# Patient Record
Sex: Female | Born: 1935 | Race: Black or African American | Hispanic: No | Smoking: Former smoker
Health system: Southern US, Community
[De-identification: ages and names within clinical notes are randomized; demographics above are authoritative.]

## PROBLEM LIST (undated history)

## (undated) DIAGNOSIS — I639 Cerebral infarction, unspecified: Secondary | ICD-10-CM

## (undated) DIAGNOSIS — N189 Chronic kidney disease, unspecified: Secondary | ICD-10-CM

## (undated) DIAGNOSIS — I10 Essential (primary) hypertension: Secondary | ICD-10-CM

## (undated) DIAGNOSIS — T783XXA Angioneurotic edema, initial encounter: Secondary | ICD-10-CM

## (undated) DIAGNOSIS — E079 Disorder of thyroid, unspecified: Secondary | ICD-10-CM

## (undated) HISTORY — DX: Chronic kidney disease, unspecified: N18.9

## (undated) HISTORY — PX: ABDOMINAL HYSTERECTOMY: SHX81

## (undated) HISTORY — PX: OTHER SURGICAL HISTORY: SHX169

---

## 1999-05-27 ENCOUNTER — Encounter: Payer: Self-pay | Admitting: Endocrinology

## 1999-05-27 ENCOUNTER — Ambulatory Visit (HOSPITAL_COMMUNITY): Admission: RE | Admit: 1999-05-27 | Discharge: 1999-05-27 | Payer: Self-pay | Admitting: Endocrinology

## 2000-06-06 ENCOUNTER — Other Ambulatory Visit: Admission: RE | Admit: 2000-06-06 | Discharge: 2000-06-06 | Payer: Self-pay | Admitting: Gynecology

## 2000-11-15 ENCOUNTER — Ambulatory Visit (HOSPITAL_COMMUNITY): Admission: RE | Admit: 2000-11-15 | Discharge: 2000-11-15 | Payer: Self-pay | Admitting: Gynecology

## 2000-11-15 ENCOUNTER — Encounter: Payer: Self-pay | Admitting: Gynecology

## 2001-06-18 ENCOUNTER — Other Ambulatory Visit: Admission: RE | Admit: 2001-06-18 | Discharge: 2001-06-18 | Payer: Self-pay | Admitting: Gynecology

## 2001-10-15 ENCOUNTER — Ambulatory Visit (HOSPITAL_COMMUNITY): Admission: RE | Admit: 2001-10-15 | Discharge: 2001-10-15 | Payer: Self-pay | Admitting: Endocrinology

## 2001-10-15 ENCOUNTER — Encounter: Payer: Self-pay | Admitting: Endocrinology

## 2001-11-18 ENCOUNTER — Ambulatory Visit (HOSPITAL_COMMUNITY): Admission: RE | Admit: 2001-11-18 | Discharge: 2001-11-18 | Payer: Self-pay | Admitting: Gynecology

## 2001-11-18 ENCOUNTER — Encounter: Payer: Self-pay | Admitting: Gynecology

## 2002-10-28 ENCOUNTER — Other Ambulatory Visit: Admission: RE | Admit: 2002-10-28 | Discharge: 2002-10-28 | Payer: Self-pay | Admitting: Gynecology

## 2003-04-23 ENCOUNTER — Ambulatory Visit (HOSPITAL_COMMUNITY): Admission: RE | Admit: 2003-04-23 | Discharge: 2003-04-23 | Payer: Self-pay | Admitting: Gynecology

## 2003-04-23 ENCOUNTER — Encounter: Payer: Self-pay | Admitting: Gynecology

## 2004-04-26 ENCOUNTER — Ambulatory Visit (HOSPITAL_COMMUNITY): Admission: RE | Admit: 2004-04-26 | Discharge: 2004-04-26 | Payer: Self-pay | Admitting: Endocrinology

## 2005-05-04 ENCOUNTER — Ambulatory Visit (HOSPITAL_COMMUNITY): Admission: RE | Admit: 2005-05-04 | Discharge: 2005-05-04 | Payer: Self-pay | Admitting: Endocrinology

## 2008-03-25 ENCOUNTER — Inpatient Hospital Stay (HOSPITAL_COMMUNITY): Admission: EM | Admit: 2008-03-25 | Discharge: 2008-03-30 | Payer: Self-pay | Admitting: Emergency Medicine

## 2008-03-26 ENCOUNTER — Encounter (INDEPENDENT_AMBULATORY_CARE_PROVIDER_SITE_OTHER): Payer: Self-pay | Admitting: Internal Medicine

## 2008-04-21 ENCOUNTER — Ambulatory Visit (HOSPITAL_COMMUNITY): Admission: RE | Admit: 2008-04-21 | Discharge: 2008-04-21 | Payer: Self-pay | Admitting: Internal Medicine

## 2008-04-24 ENCOUNTER — Ambulatory Visit (HOSPITAL_COMMUNITY): Admission: RE | Admit: 2008-04-24 | Discharge: 2008-04-24 | Payer: Self-pay | Admitting: Urology

## 2008-05-13 ENCOUNTER — Other Ambulatory Visit: Admission: RE | Admit: 2008-05-13 | Discharge: 2008-05-13 | Payer: Self-pay | Admitting: Interventional Radiology

## 2008-05-13 ENCOUNTER — Encounter: Admission: RE | Admit: 2008-05-13 | Discharge: 2008-05-13 | Payer: Self-pay | Admitting: Internal Medicine

## 2008-05-13 ENCOUNTER — Encounter (INDEPENDENT_AMBULATORY_CARE_PROVIDER_SITE_OTHER): Payer: Self-pay | Admitting: Interventional Radiology

## 2008-05-18 ENCOUNTER — Ambulatory Visit (HOSPITAL_COMMUNITY): Admission: RE | Admit: 2008-05-18 | Discharge: 2008-05-18 | Payer: Self-pay | Admitting: Urology

## 2008-05-19 ENCOUNTER — Encounter: Admission: RE | Admit: 2008-05-19 | Discharge: 2008-05-19 | Payer: Self-pay | Admitting: Urology

## 2008-06-03 ENCOUNTER — Ambulatory Visit (HOSPITAL_COMMUNITY): Admission: RE | Admit: 2008-06-03 | Discharge: 2008-06-03 | Payer: Self-pay | Admitting: Family Medicine

## 2008-08-06 ENCOUNTER — Encounter (INDEPENDENT_AMBULATORY_CARE_PROVIDER_SITE_OTHER): Payer: Self-pay | Admitting: Urology

## 2008-08-06 ENCOUNTER — Inpatient Hospital Stay (HOSPITAL_COMMUNITY): Admission: RE | Admit: 2008-08-06 | Discharge: 2008-08-09 | Payer: Self-pay | Admitting: Urology

## 2008-12-15 ENCOUNTER — Ambulatory Visit (HOSPITAL_COMMUNITY): Admission: RE | Admit: 2008-12-15 | Discharge: 2008-12-15 | Payer: Self-pay | Admitting: Urology

## 2009-02-17 ENCOUNTER — Ambulatory Visit (HOSPITAL_COMMUNITY): Admission: RE | Admit: 2009-02-17 | Discharge: 2009-02-17 | Payer: Self-pay | Admitting: Urology

## 2009-02-24 ENCOUNTER — Inpatient Hospital Stay (HOSPITAL_COMMUNITY): Admission: AD | Admit: 2009-02-24 | Discharge: 2009-03-02 | Payer: Self-pay | Admitting: Internal Medicine

## 2009-02-24 ENCOUNTER — Encounter: Payer: Self-pay | Admitting: Emergency Medicine

## 2009-02-25 ENCOUNTER — Encounter (INDEPENDENT_AMBULATORY_CARE_PROVIDER_SITE_OTHER): Payer: Self-pay | Admitting: Internal Medicine

## 2009-02-25 ENCOUNTER — Ambulatory Visit: Payer: Self-pay | Admitting: Physical Medicine & Rehabilitation

## 2009-02-26 ENCOUNTER — Encounter (INDEPENDENT_AMBULATORY_CARE_PROVIDER_SITE_OTHER): Payer: Self-pay | Admitting: Internal Medicine

## 2009-03-02 ENCOUNTER — Inpatient Hospital Stay (HOSPITAL_COMMUNITY)
Admission: RE | Admit: 2009-03-02 | Discharge: 2009-03-27 | Payer: Self-pay | Admitting: Physical Medicine & Rehabilitation

## 2009-03-06 ENCOUNTER — Ambulatory Visit: Payer: Self-pay | Admitting: Physical Medicine & Rehabilitation

## 2009-03-15 ENCOUNTER — Ambulatory Visit: Payer: Self-pay | Admitting: Psychology

## 2009-03-30 ENCOUNTER — Encounter
Admission: RE | Admit: 2009-03-30 | Discharge: 2009-05-26 | Payer: Self-pay | Admitting: Physical Medicine & Rehabilitation

## 2009-04-12 IMAGING — US US BIOPSY
1 series · 13 of 15 positions shown · non-contrast
Comparison: Prior thyroid ultrasound dated 04/21/2008

CLINICAL DATA: Thyroid goiter with dominant solid nodules in both
right and left lobes of thyroid.  The patient presents for biopsy.

ULTRASOUND-GUIDED NEEDLE ASPIRATE BIOPSY OF BILATERAL THYROID
NODULES
The above procedure was discussed with the patient and written
informed consent was obtained.

[Series 1: us biopsy · 0.09mm/px · 15 acquisitions, 13 frames shown]
[im 1/15]
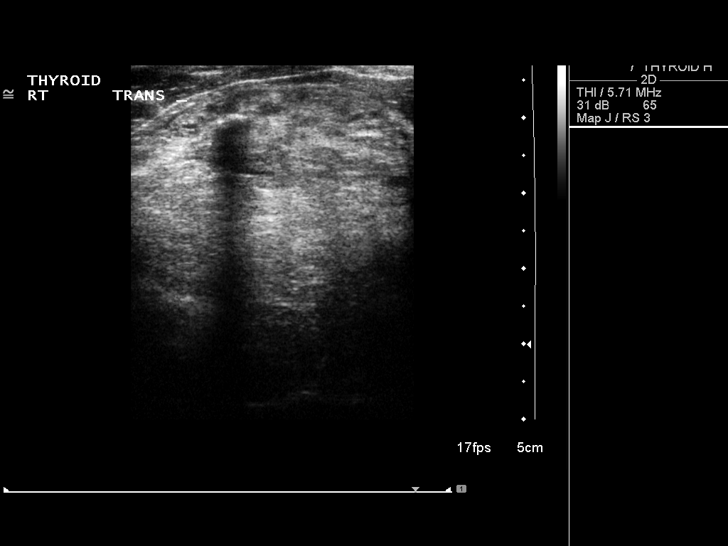
[im 2/15]
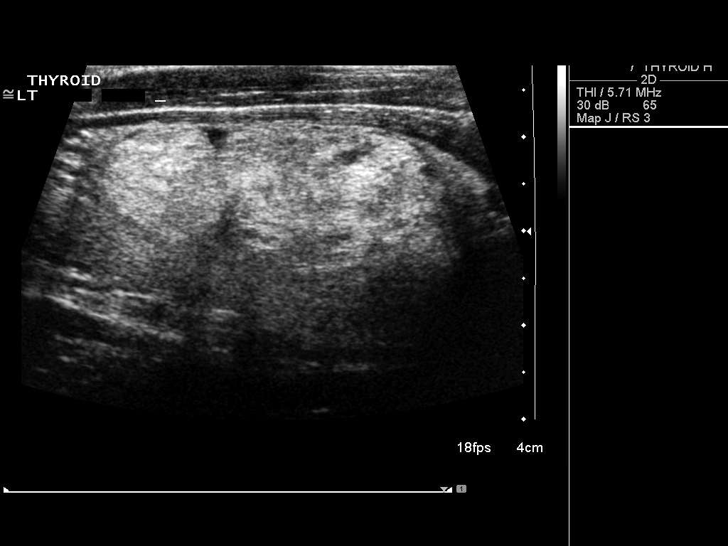
[im 3/15]
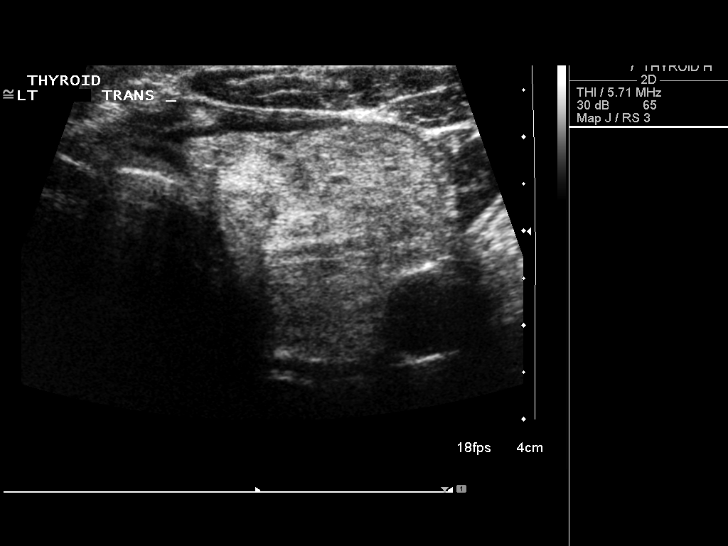
[im 5/15]
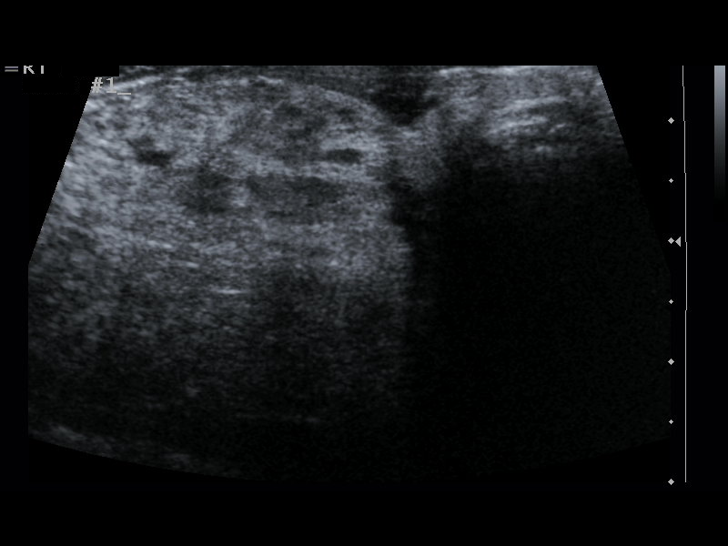
[im 6/15]
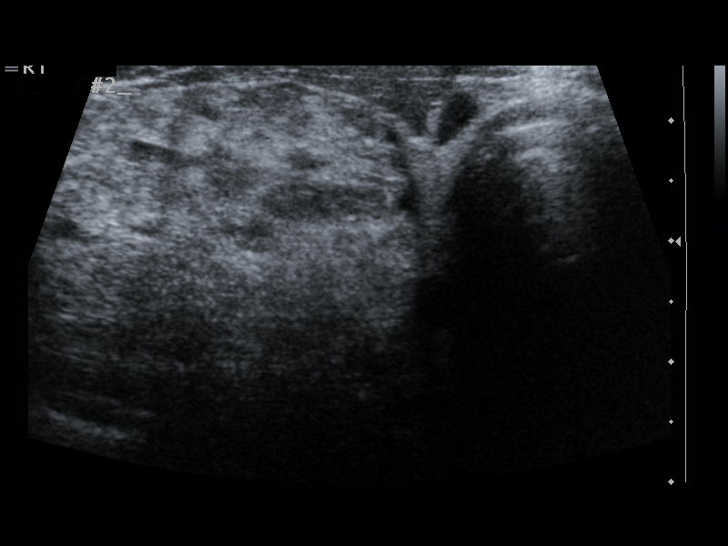
[im 7/15]
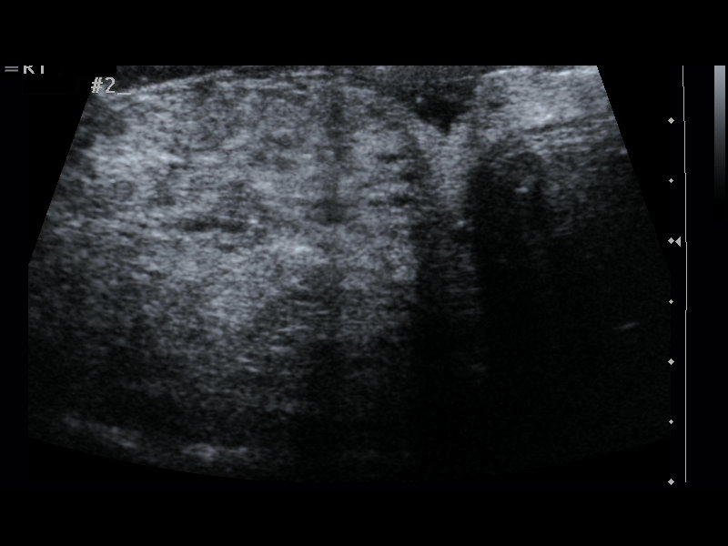
[im 8/15]
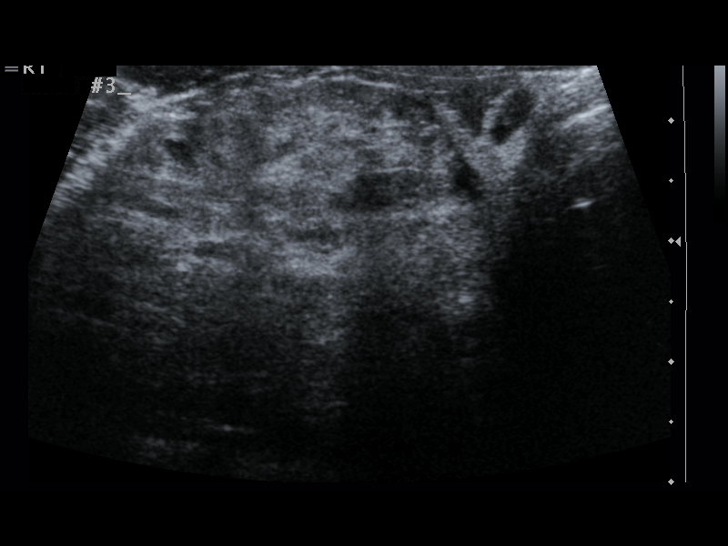
[im 9/15]
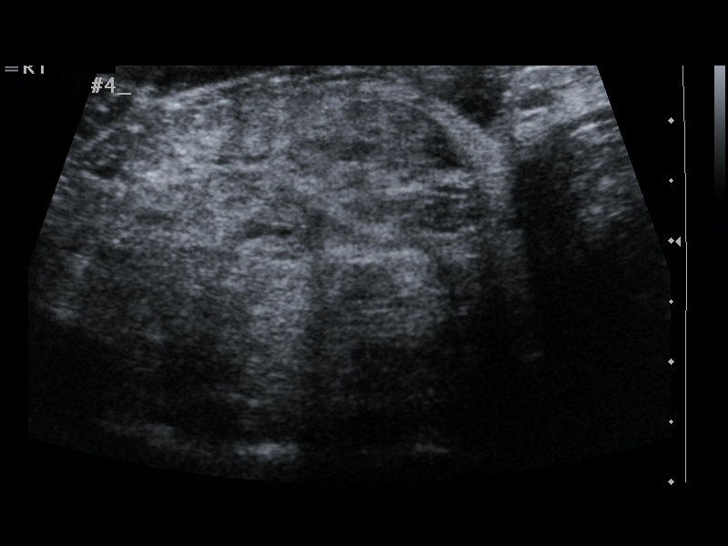
[im 10/15]
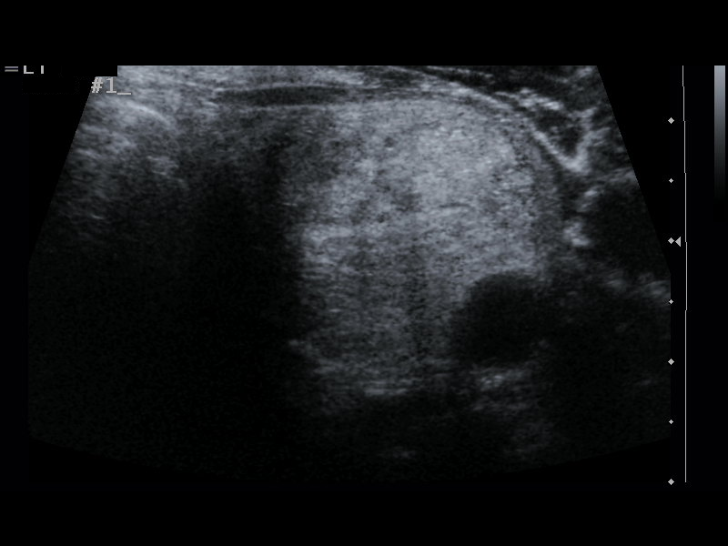
[im 11/15]
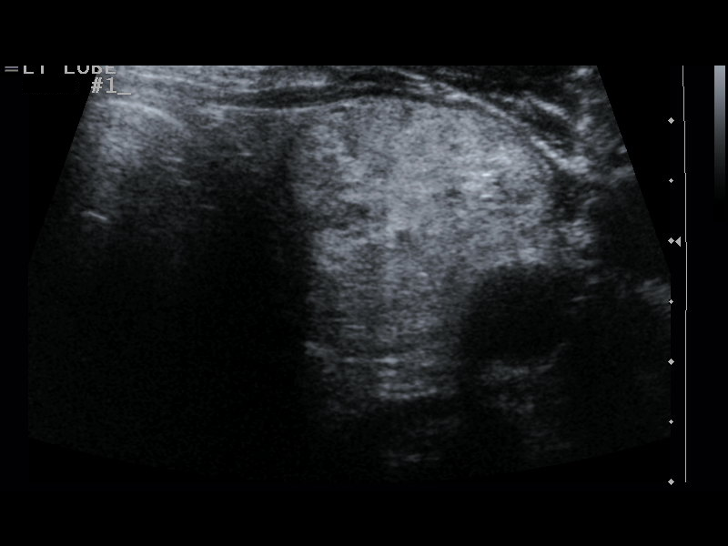
[im 13/15]
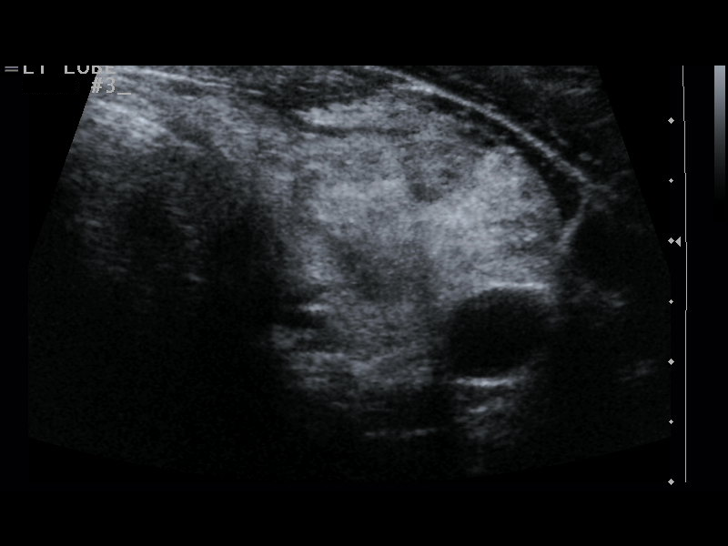
[im 14/15]
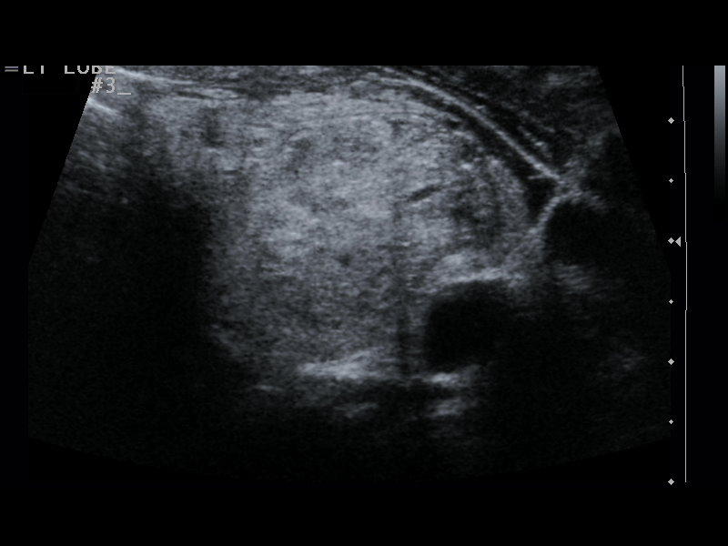
[im 15/15]
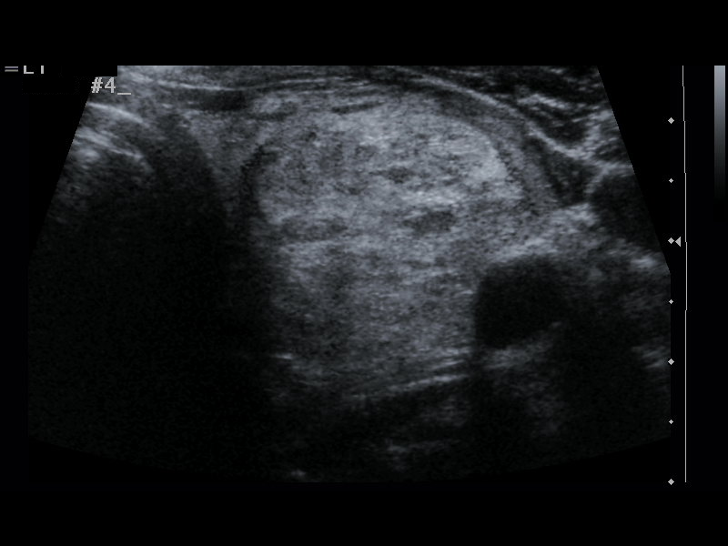

[13 of 15 positions shown; findings below may reference images not displayed]

FINDINGS: Ultrasound was performed to localize and mark an adequate
site for the biopsy.  The patient was then prepped and draped in a
normal sterile fashion.  Local anesthesia was provided with 1%
lidocaine.

Using direct ultrasound guidance, four passes were made using 25
gauge needles into the nodule within the right lobe of the thyroid.
Ultrasound was used to confirm needle placements on all occasions.

Using direct ultrasound guidance, four passes were made using 25
gauge needles into the nodule within the left lobe of the thyroid.
Ultrasound was used to confirm needle placements on all occasions.

Specimens were sent to Pathology for analysis.  Post procedural
imaging demonstrated no hematoma or immediate complication.  The
patient tolerated the procedure well.
IMPRESSION: Ultrasound guided needle aspirate biopsy times two of bilateral
dominant thyroid nodules.  Separate samples were sent for both
right and left nodule cytology.

## 2009-04-26 ENCOUNTER — Encounter
Admission: RE | Admit: 2009-04-26 | Discharge: 2009-07-25 | Payer: Self-pay | Admitting: Physical Medicine & Rehabilitation

## 2009-04-27 ENCOUNTER — Ambulatory Visit: Payer: Self-pay | Admitting: Physical Medicine & Rehabilitation

## 2009-05-27 ENCOUNTER — Encounter
Admission: RE | Admit: 2009-05-27 | Discharge: 2009-08-25 | Payer: Self-pay | Admitting: Physical Medicine & Rehabilitation

## 2009-06-08 ENCOUNTER — Ambulatory Visit (HOSPITAL_COMMUNITY)
Admission: RE | Admit: 2009-06-08 | Discharge: 2009-06-08 | Payer: Self-pay | Admitting: Physical Medicine & Rehabilitation

## 2009-06-08 ENCOUNTER — Ambulatory Visit: Payer: Self-pay | Admitting: Physical Medicine & Rehabilitation

## 2009-06-29 ENCOUNTER — Ambulatory Visit: Payer: Self-pay | Admitting: Physical Medicine & Rehabilitation

## 2009-07-26 ENCOUNTER — Encounter
Admission: RE | Admit: 2009-07-26 | Discharge: 2009-08-19 | Payer: Self-pay | Admitting: Physical Medicine & Rehabilitation

## 2009-07-27 ENCOUNTER — Ambulatory Visit: Payer: Self-pay | Admitting: Physical Medicine & Rehabilitation

## 2009-08-10 ENCOUNTER — Ambulatory Visit: Payer: Self-pay | Admitting: Physical Medicine & Rehabilitation

## 2009-08-28 DIAGNOSIS — I639 Cerebral infarction, unspecified: Secondary | ICD-10-CM

## 2009-08-28 HISTORY — DX: Cerebral infarction, unspecified: I63.9

## 2009-09-02 ENCOUNTER — Ambulatory Visit (HOSPITAL_COMMUNITY): Admission: RE | Admit: 2009-09-02 | Discharge: 2009-09-02 | Payer: Self-pay | Admitting: Urology

## 2009-09-22 ENCOUNTER — Encounter
Admission: RE | Admit: 2009-09-22 | Discharge: 2009-12-21 | Payer: Self-pay | Admitting: Physical Medicine & Rehabilitation

## 2009-09-24 ENCOUNTER — Ambulatory Visit: Payer: Self-pay | Admitting: Physical Medicine & Rehabilitation

## 2009-10-19 ENCOUNTER — Ambulatory Visit: Payer: Self-pay | Admitting: Physical Medicine & Rehabilitation

## 2009-11-19 ENCOUNTER — Ambulatory Visit: Payer: Self-pay | Admitting: Physical Medicine & Rehabilitation

## 2009-12-28 ENCOUNTER — Encounter
Admission: RE | Admit: 2009-12-28 | Discharge: 2010-03-28 | Payer: Self-pay | Admitting: Physical Medicine & Rehabilitation

## 2009-12-31 ENCOUNTER — Ambulatory Visit: Payer: Self-pay | Admitting: Physical Medicine & Rehabilitation

## 2010-02-16 IMAGING — US US ABDOMEN COMPLETE
1 series · 14 of 25 positions shown · non-contrast
Comparison: MRI abdomen 02/17/2009.

CLINICAL DATA: Nausea and decreased appetite.  Increasing BUN and
creatinine.

ABDOMINAL ULTRASOUND COMPLETE

[Series 1: us abdomen complete · 0.28mm/px · 14 of 60 slices shown]
[im 1/60]
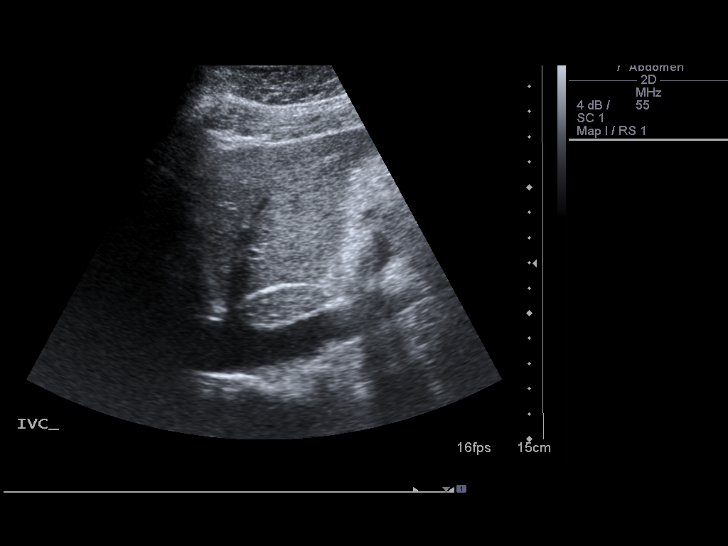
[im 5/60]
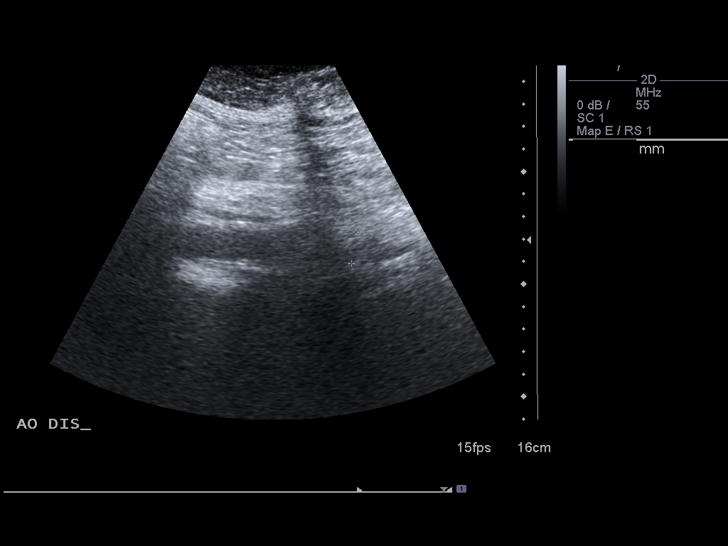
[im 10/60]
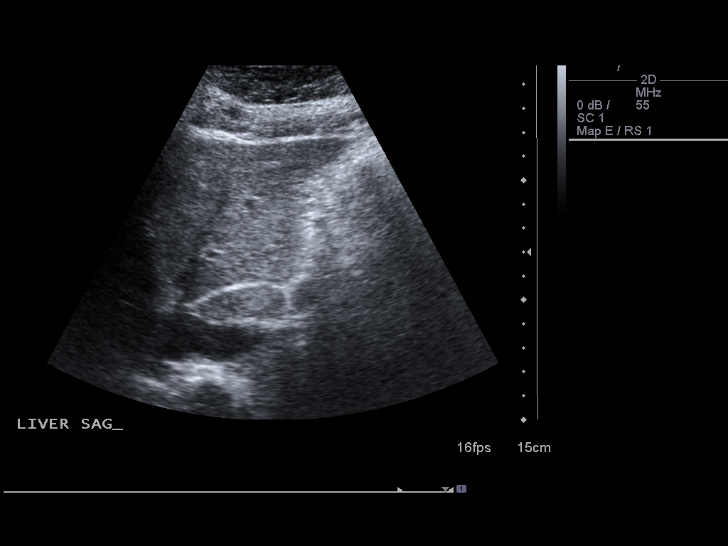
[im 15/60]
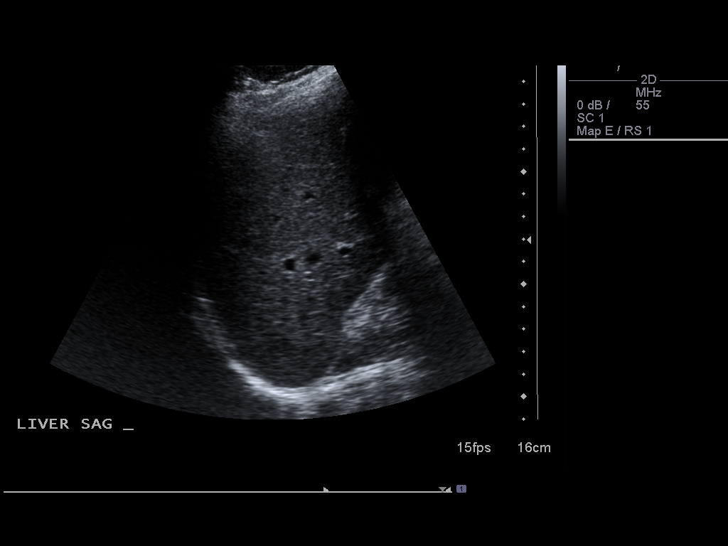
[im 20/60]
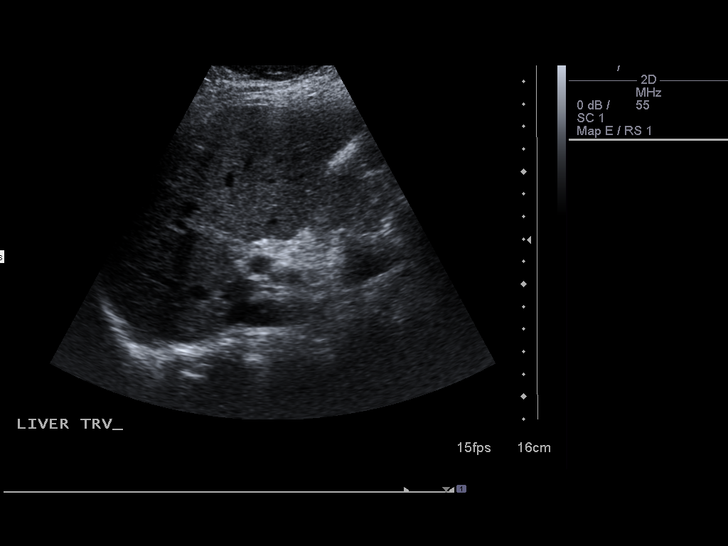
[im 23/60]
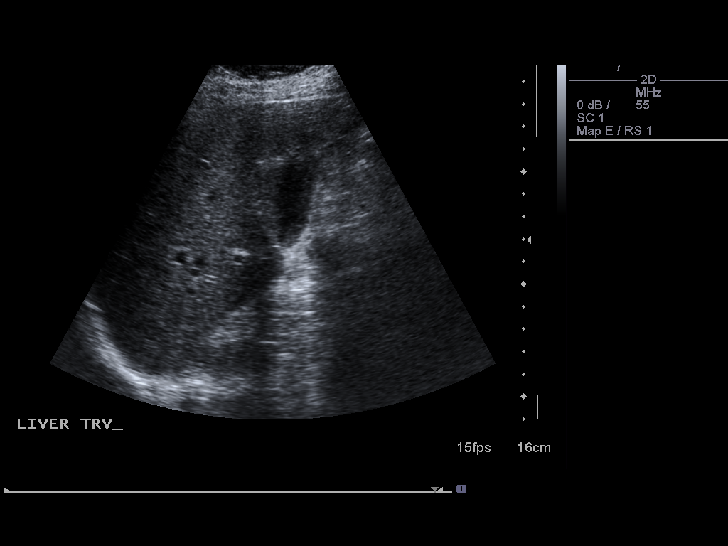
[im 28/60]
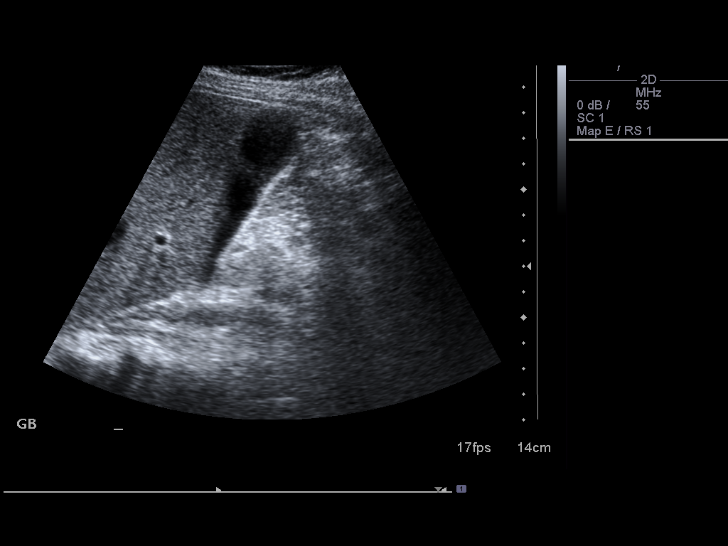
[im 32/60]
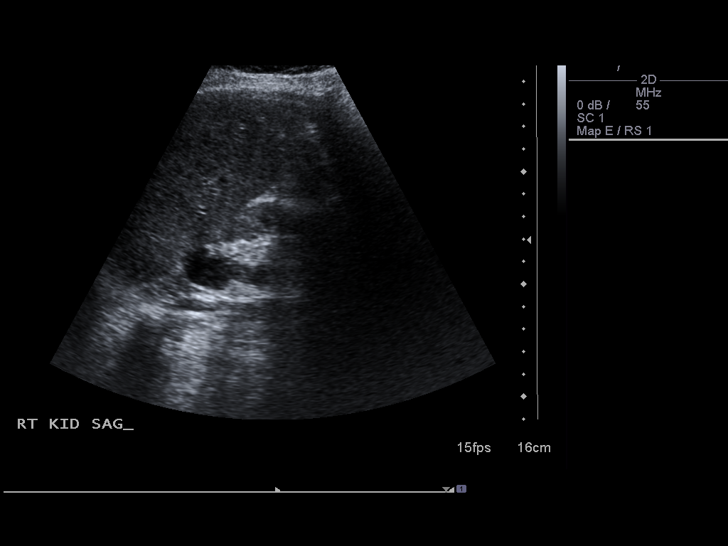
[im 37/60]
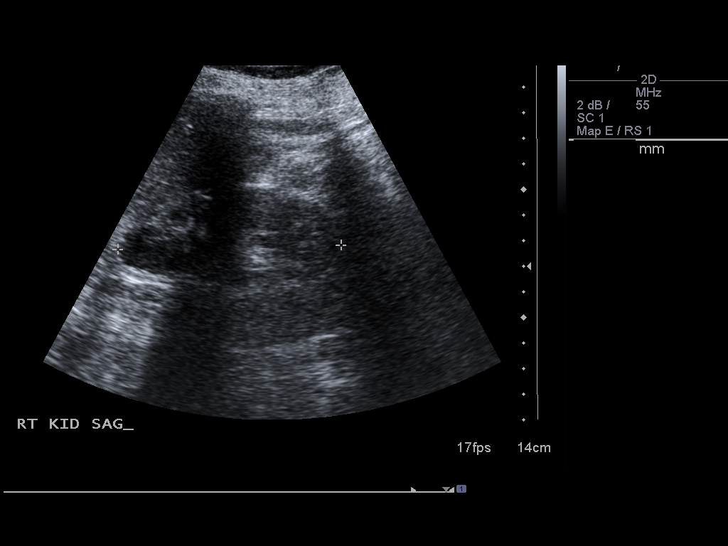
[im 40/60]
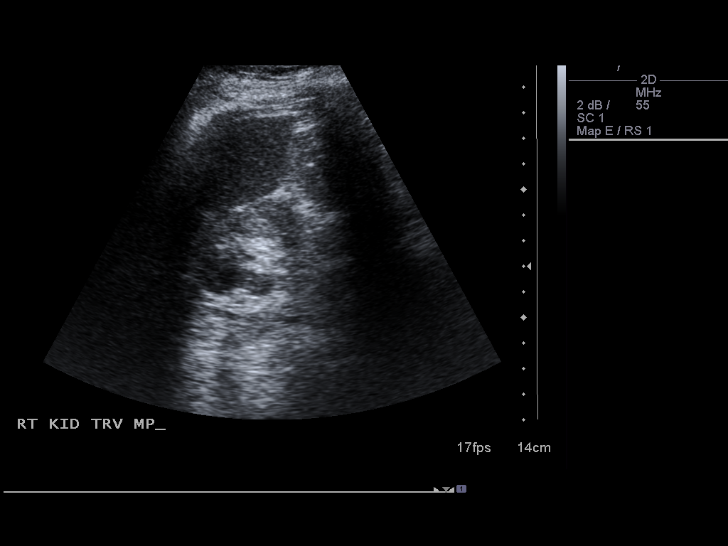
[im 45/60]
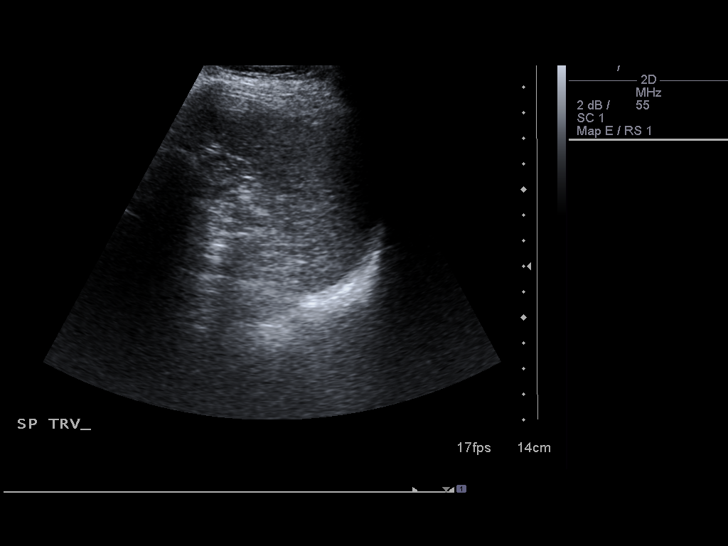
[im 50/60]
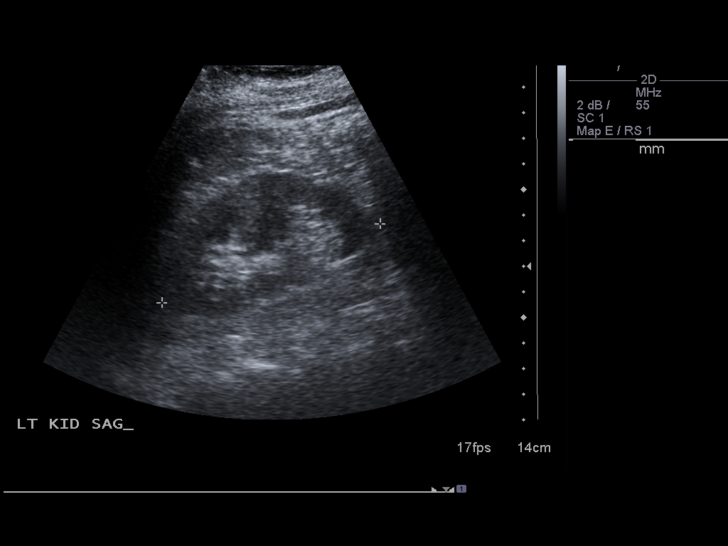
[im 55/60]
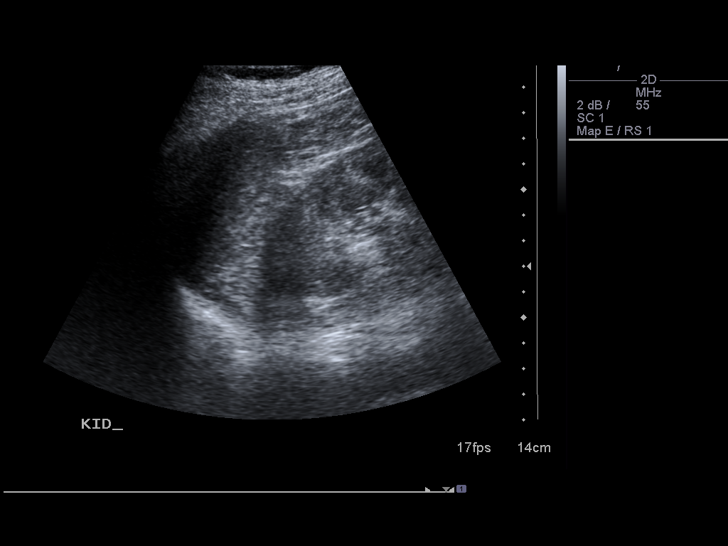
[im 60/60]
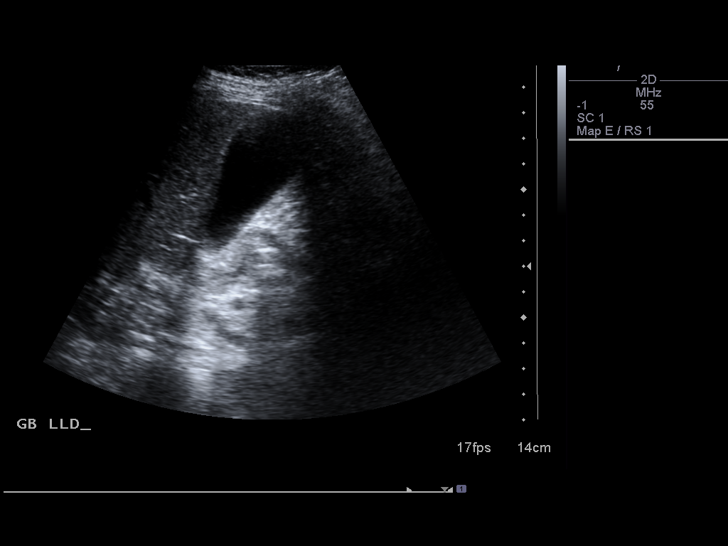

[14 of 25 positions shown; findings below may reference images not displayed]

FINDINGS: Gallbladder:  No gallstones, gallbladder wall thickening, or
pericholecystic fluid.

Common Bile Duct:  Within normal limits in caliber.

Liver:  No focal lesion identified.  Within normal limits in
parenchymal echogenicity.

IVC:  Appears normal.

Pancreas:  The pancreas is not visualized.

Spleen:  Within normal limits in size and echotexture.

Right kidney: Normal in size and parenchymal echogenicity.  No
evidence of hydronephrosis.   Cyst is seen in the upper pole
measuring 2.1 x 1.7 x 2.0 cm.  Left renal lesion described on
report of prior MRI is not visible on this examination.

Left kidney: Normal in size and parenchymal echogenicity.  No
evidence of mass or hydronephrosis.

Abdominal Aorta:  No aneurysm identified.
IMPRESSION: 1.  No acute finding. Gallbladder appears normal.
2.  Right renal cyst.
3.  Lesion in the left kidney described as enhancing on prior MRI
is not visible on this study.

## 2010-02-22 ENCOUNTER — Ambulatory Visit: Payer: Self-pay | Admitting: Physical Medicine & Rehabilitation

## 2010-03-15 ENCOUNTER — Ambulatory Visit (HOSPITAL_COMMUNITY): Admission: RE | Admit: 2010-03-15 | Discharge: 2010-03-15 | Payer: Self-pay | Admitting: Urology

## 2010-04-11 ENCOUNTER — Encounter
Admission: RE | Admit: 2010-04-11 | Discharge: 2010-07-10 | Payer: Self-pay | Admitting: Physical Medicine & Rehabilitation

## 2010-04-19 ENCOUNTER — Ambulatory Visit: Payer: Self-pay | Admitting: Physical Medicine & Rehabilitation

## 2010-07-14 ENCOUNTER — Encounter
Admission: RE | Admit: 2010-07-14 | Discharge: 2010-09-27 | Payer: Self-pay | Source: Home / Self Care | Attending: Physical Medicine & Rehabilitation | Admitting: Physical Medicine & Rehabilitation

## 2010-07-19 ENCOUNTER — Ambulatory Visit: Payer: Self-pay | Admitting: Physical Medicine & Rehabilitation

## 2010-07-27 ENCOUNTER — Ambulatory Visit (HOSPITAL_COMMUNITY)
Admission: RE | Admit: 2010-07-27 | Discharge: 2010-07-27 | Payer: Self-pay | Source: Home / Self Care | Admitting: Urology

## 2010-08-02 IMAGING — CR DG CHEST 2V
2 series · 2 of 2 positions shown · non-contrast
Comparison: Two-view chest x-ray 02/17/2009, 08/03/2008, and
03/25/2008.

CLINICAL DATA: History of right renal cell carcinoma.  Routine
follow-up.

CHEST - 2 VIEW 09/02/2009:

[w chest pa]
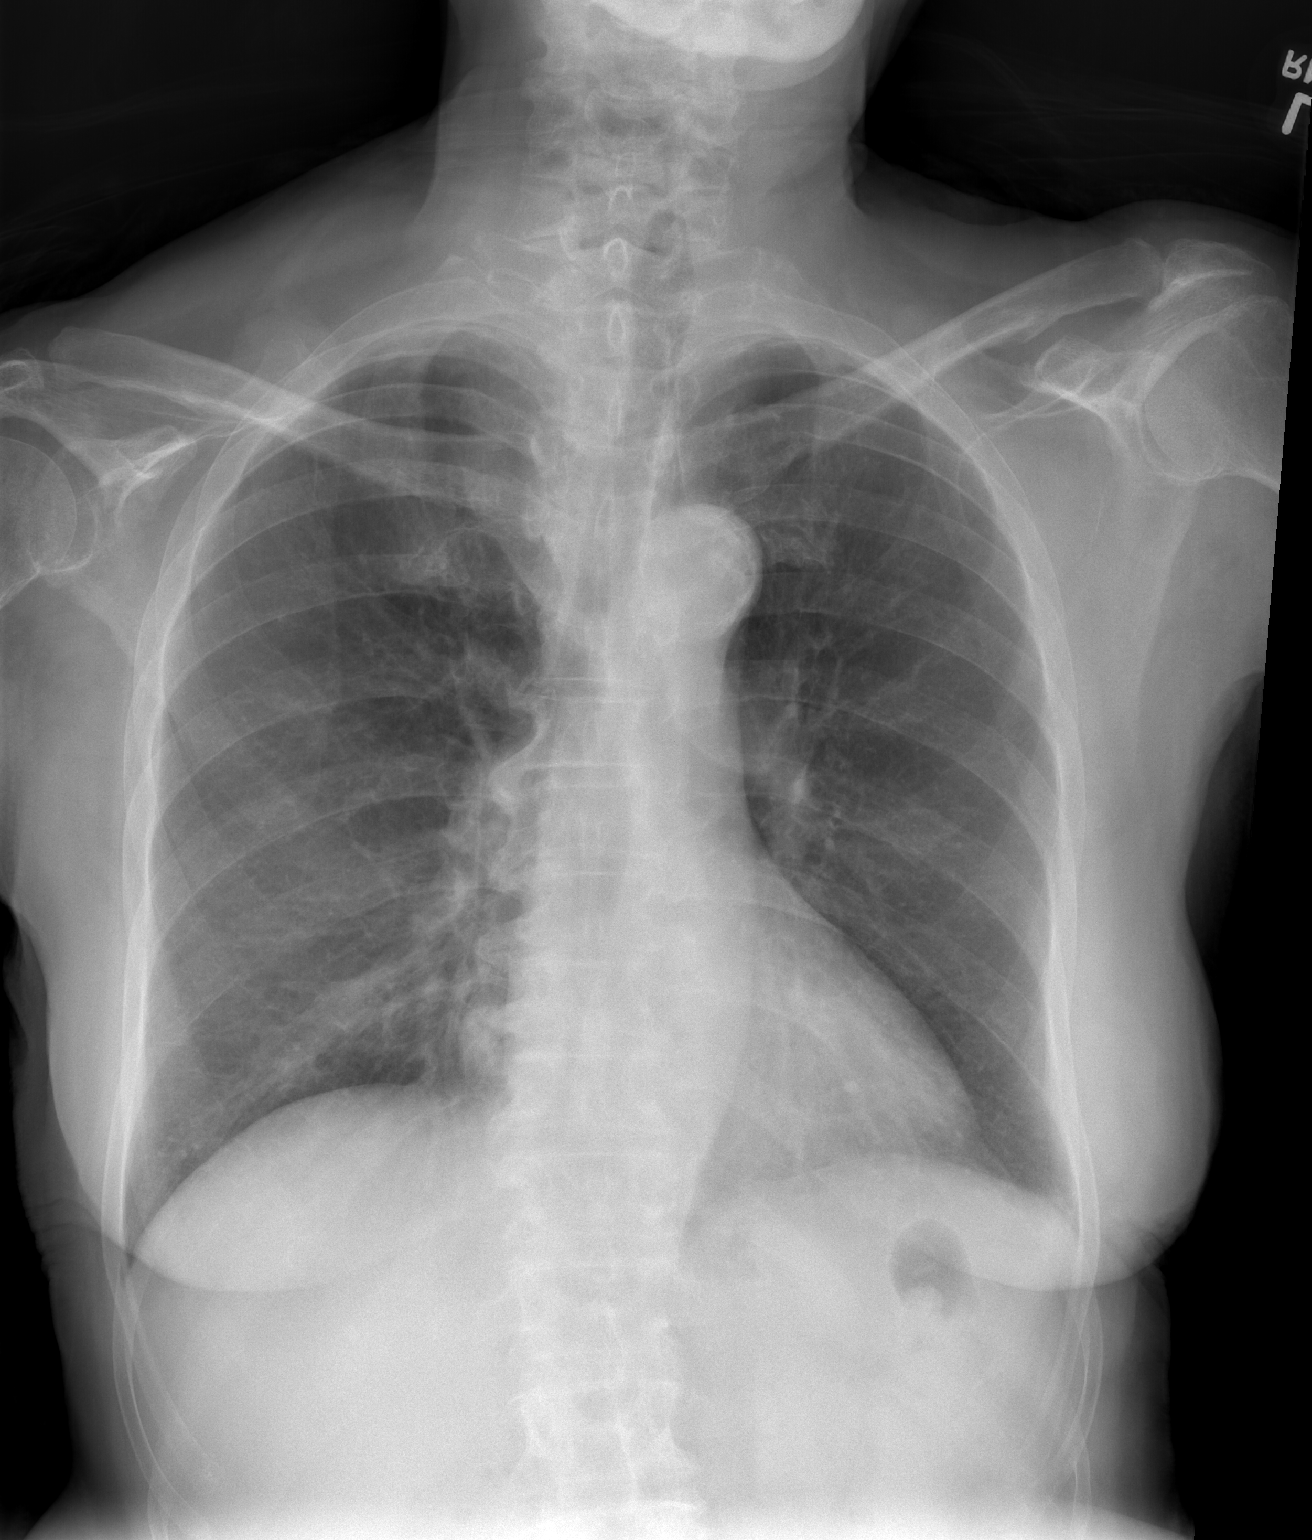

[w chest lat]
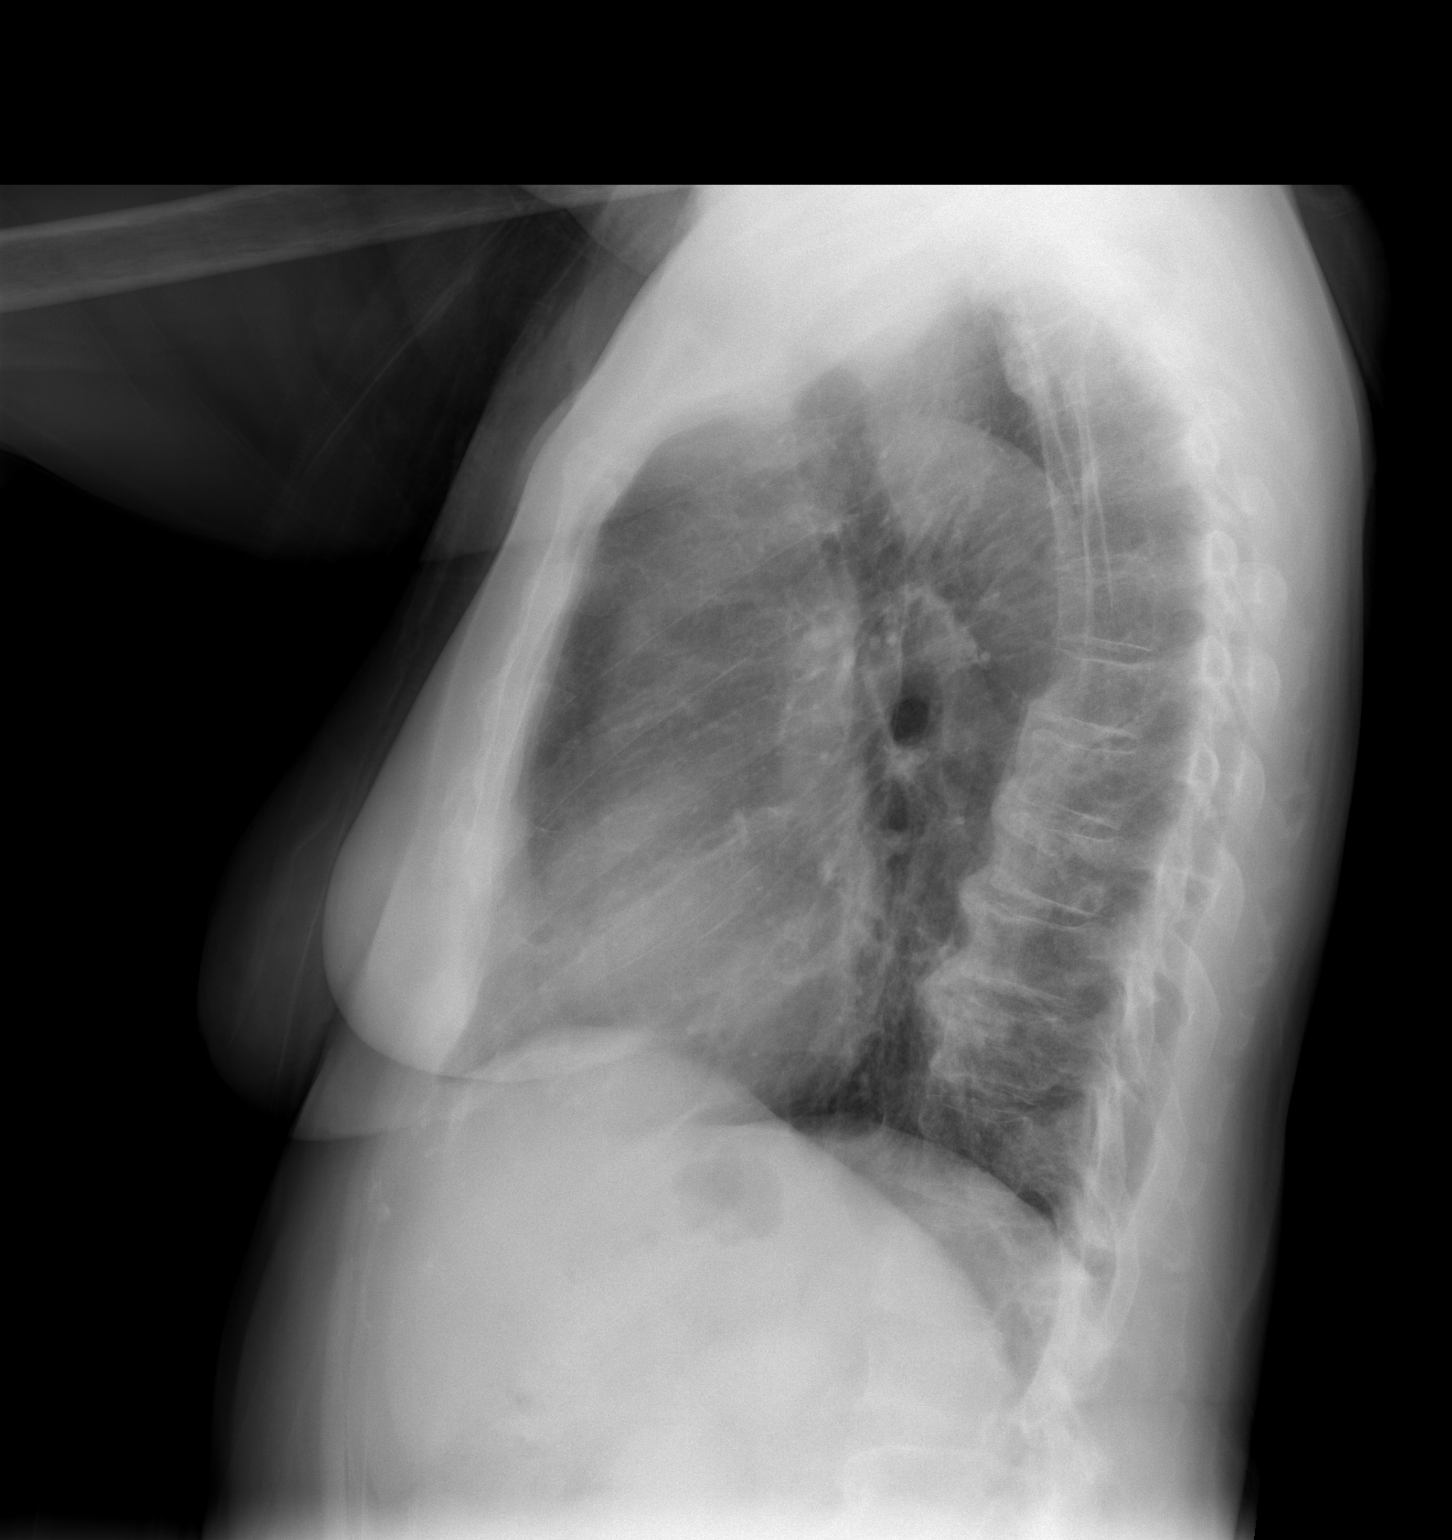

[2 of 2 positions shown; findings below may reference images not displayed]

FINDINGS: Heart mildly enlarged but stable.  Thoracic aorta mildly
tortuous and atherosclerotic, unchanged.  Slight tracheal deviation
to the right as noted previously, unchanged, possibly due to a
goiter.  Hilar and mediastinal contours otherwise unremarkable.
Lungs clear.  Pulmonary vascularity normal.  Bronchovascular
markings normal.  No pleural effusions.  Degenerative changes
throughout the thoracic spine.
IMPRESSION: No acute cardiopulmonary disease.  No evidence of metastasis.
Stable mild cardiomegaly.

## 2010-08-03 ENCOUNTER — Encounter
Admission: RE | Admit: 2010-08-03 | Discharge: 2010-08-03 | Payer: Self-pay | Source: Home / Self Care | Admitting: Family Medicine

## 2010-08-30 ENCOUNTER — Encounter
Admission: RE | Admit: 2010-08-30 | Discharge: 2010-09-27 | Payer: Self-pay | Source: Home / Self Care | Attending: Physical Medicine & Rehabilitation | Admitting: Physical Medicine & Rehabilitation

## 2010-08-30 ENCOUNTER — Encounter (HOSPITAL_COMMUNITY): Admission: RE | Admit: 2010-08-30 | Payer: Self-pay | Source: Home / Self Care | Admitting: Internal Medicine

## 2010-09-05 ENCOUNTER — Ambulatory Visit: Admit: 2010-09-05 | Payer: Self-pay | Admitting: Physical Medicine & Rehabilitation

## 2010-09-06 ENCOUNTER — Encounter (HOSPITAL_COMMUNITY): Admission: RE | Admit: 2010-09-06 | Payer: Self-pay | Source: Home / Self Care | Admitting: Internal Medicine

## 2010-09-18 ENCOUNTER — Encounter: Payer: Self-pay | Admitting: Urology

## 2010-09-22 ENCOUNTER — Other Ambulatory Visit (HOSPITAL_COMMUNITY): Payer: Self-pay | Admitting: Internal Medicine

## 2010-09-22 DIAGNOSIS — E0520 Thyrotoxicosis with toxic multinodular goiter without thyrotoxic crisis or storm: Secondary | ICD-10-CM

## 2010-09-28 ENCOUNTER — Encounter: Payer: Self-pay | Admitting: Internal Medicine

## 2010-10-04 ENCOUNTER — Ambulatory Visit (HOSPITAL_COMMUNITY)
Admission: RE | Admit: 2010-10-04 | Discharge: 2010-10-04 | Disposition: A | Discharge status: Home / Self Care | Payer: MEDICARE | Source: Ambulatory Visit | Attending: Internal Medicine | Admitting: Internal Medicine

## 2010-10-04 DIAGNOSIS — E0520 Thyrotoxicosis with toxic multinodular goiter without thyrotoxic crisis or storm: Secondary | ICD-10-CM

## 2010-10-04 MED ORDER — SODIUM PERTECHNETATE TC 99M INJECTION: 10.0000 | Freq: Once | INTRAVENOUS | Status: DC | PRN

## 2010-10-04 MED ORDER — SODIUM IODIDE I 131 CAPSULE
0.0800 | Freq: Once | INTRAVENOUS | Status: AC | PRN
  Administered 2010-10-05: 1 via ORAL

## 2010-10-05 ENCOUNTER — Ambulatory Visit (HOSPITAL_COMMUNITY)
Admission: RE | Admit: 2010-10-05 | Discharge: 2010-10-05 | Disposition: A | Discharge status: Home / Self Care | Payer: MEDICARE | Source: Ambulatory Visit | Attending: Internal Medicine | Admitting: Internal Medicine

## 2010-10-05 DIAGNOSIS — R220 Localized swelling, mass and lump, head: Secondary | ICD-10-CM | POA: Insufficient documentation

## 2010-10-05 DIAGNOSIS — R221 Localized swelling, mass and lump, neck: Secondary | ICD-10-CM | POA: Insufficient documentation

## 2010-10-05 DIAGNOSIS — E0520 Thyrotoxicosis with toxic multinodular goiter without thyrotoxic crisis or storm: Secondary | ICD-10-CM | POA: Insufficient documentation

## 2010-10-05 MED ORDER — SODIUM PERTECHNETATE TC 99M INJECTION
10.0000 | Freq: Once | INTRAVENOUS | Status: AC | PRN
  Administered 2010-10-05: 10 via INTRAVENOUS

## 2010-10-05 MED ORDER — SODIUM IODIDE I 131 CAPSULE: 1000.0000 | Freq: Once | INTRAVENOUS | Status: AC | PRN

## 2010-11-11 ENCOUNTER — Ambulatory Visit: Payer: MEDICARE | Admitting: Physical Medicine & Rehabilitation

## 2010-11-25 ENCOUNTER — Encounter: Payer: MEDICARE | Attending: Physical Medicine & Rehabilitation

## 2010-11-28 ENCOUNTER — Encounter: Payer: MEDICARE | Attending: Physical Medicine & Rehabilitation

## 2010-11-28 ENCOUNTER — Ambulatory Visit: Payer: MEDICARE | Attending: Family Medicine | Admitting: Physical Therapy

## 2010-11-28 ENCOUNTER — Ambulatory Visit (HOSPITAL_BASED_OUTPATIENT_CLINIC_OR_DEPARTMENT_OTHER): Payer: MEDICARE | Admitting: Physical Medicine & Rehabilitation

## 2010-11-28 DIAGNOSIS — M6281 Muscle weakness (generalized): Secondary | ICD-10-CM | POA: Insufficient documentation

## 2010-11-28 DIAGNOSIS — X58XXXS Exposure to other specified factors, sequela: Secondary | ICD-10-CM | POA: Insufficient documentation

## 2010-11-28 DIAGNOSIS — R269 Unspecified abnormalities of gait and mobility: Secondary | ICD-10-CM | POA: Insufficient documentation

## 2010-11-28 DIAGNOSIS — I69959 Hemiplegia and hemiparesis following unspecified cerebrovascular disease affecting unspecified side: Secondary | ICD-10-CM | POA: Insufficient documentation

## 2010-11-28 DIAGNOSIS — IMO0001 Reserved for inherently not codable concepts without codable children: Secondary | ICD-10-CM | POA: Insufficient documentation

## 2010-11-28 DIAGNOSIS — IMO0002 Reserved for concepts with insufficient information to code with codable children: Secondary | ICD-10-CM | POA: Insufficient documentation

## 2010-11-28 DIAGNOSIS — I69998 Other sequelae following unspecified cerebrovascular disease: Secondary | ICD-10-CM | POA: Insufficient documentation

## 2010-11-28 DIAGNOSIS — G8110 Spastic hemiplegia affecting unspecified side: Secondary | ICD-10-CM

## 2010-11-29 NOTE — Assessment & Plan Note (Signed)
A 77 year old female with right spastic hemiplegia due to CVA stroke onset February 24, 2009.  Incidental finding, right parietal meningioma.  A 2-D echo showing no evidence of cerebral embolism.  Carotid Doppler showed right ICA stenosis 68%.  She is followed up medically with Dr. Paulino Rily.  She has had a subacromial bursitis, improved after steroid injection on the right shoulder.  She has had musculocutaneous nerve block as well as Botox in forearm for spasticity, although her right hand is once again difficult to open.  She is complaining of right lower extremity pain last time I saw her, this has improved.  She was having increased tone, we  started tizanidine 2 mg t.i.d.  No new problems.  Ankle does feel better.  Interval medical history otherwise negative, but her primary care has started back on some physical therapy.  PHYSICAL EXAMINATION:  VITAL SIGNS:  Blood pressure 153/57, pulse 59, respirations 18, and O2 sat 96% on room air. GENERAL:  No acute distress.  Mood and affect appropriate. MUSCULOSKELETAL:  Her right lower extremity has clonus at the ankle with increased tone at the hamstring.  She stands without difficulty.  Her right ankle has no evidence of swelling.  No pain with range of motion including stress of the anterior talofibular ligament.  She has no evidence of erythema. Right upper extremity has Ashworth grade 3 spasticity right flexor forearm muscles.  Biceps at Ashworth 2, she has 3- strength in the deltoid biceps and 2- at the finger extensors, finger flexors are strong, but influenced by her spasticity tone.  IMPRESSION: 1. Right spastic hemiplegia due to the left cerebrovascular accident. 2. Right ankle sprain, improved.  PLAN:  She is going back through some therapy now.  I do think she would benefit from Botox to the right flexor forearm.     Erick Colace, M.D. Electronically Signed    AEK/MedQ D:  11/28/2010 12:55:33  T:  11/29/2010  02:40:09  Job #:  [id number]  cc:   [facility]

## 2010-11-30 ENCOUNTER — Ambulatory Visit: Payer: MEDICARE | Admitting: Physical Therapy

## 2010-12-04 LAB — BASIC METABOLIC PANEL
BUN: 20 mg/dL (ref 6–23)
BUN: 29 mg/dL — ABNORMAL HIGH (ref 6–23)
BUN: 31 mg/dL — ABNORMAL HIGH (ref 6–23)
BUN: 28 mg/dL — ABNORMAL HIGH (ref 6–23)
BUN: 37 mg/dL — ABNORMAL HIGH (ref 6–23)
BUN: 39 mg/dL — ABNORMAL HIGH (ref 6–23)
CO2: 23 mEq/L (ref 19–32)
CO2: 27 mEq/L (ref 19–32)
CO2: 26 mEq/L (ref 19–32)
CO2: 28 mEq/L (ref 19–32)
Calcium: 9.1 mg/dL (ref 8.4–10.5)
Calcium: 9.2 mg/dL (ref 8.4–10.5)
Calcium: 9.4 mg/dL (ref 8.4–10.5)
Chloride: 104 mEq/L (ref 96–112)
Chloride: 114 mEq/L — ABNORMAL HIGH (ref 96–112)
Chloride: 105 mEq/L (ref 96–112)
Chloride: 110 mEq/L (ref 96–112)
Chloride: 111 mEq/L (ref 96–112)
Creatinine, Ser: 1.87 mg/dL — ABNORMAL HIGH (ref 0.4–1.2)
Creatinine, Ser: 2.29 mg/dL — ABNORMAL HIGH (ref 0.4–1.2)
Creatinine, Ser: 1.5 mg/dL — ABNORMAL HIGH (ref 0.4–1.2)
GFR calc Af Amer: 25 mL/min — ABNORMAL LOW (ref >60)
GFR calc Af Amer: 32 mL/min — ABNORMAL LOW (ref >60)
GFR calc Af Amer: 34 mL/min — ABNORMAL LOW (ref >60)
GFR calc non Af Amer: 21 mL/min — ABNORMAL LOW (ref >60)
GFR calc non Af Amer: 28 mL/min — ABNORMAL LOW (ref >60)
Glucose, Bld: 110 mg/dL — ABNORMAL HIGH (ref 70–99)
Glucose, Bld: 96 mg/dL (ref 70–99)
Glucose, Bld: 103 mg/dL — ABNORMAL HIGH (ref 70–99)
Glucose, Bld: 110 mg/dL — ABNORMAL HIGH (ref 70–99)
Glucose, Bld: 90 mg/dL (ref 70–99)
Potassium: 4 mEq/L (ref 3.5–5.1)
Potassium: 3.7 mEq/L (ref 3.5–5.1)
Potassium: 4 mEq/L (ref 3.5–5.1)
Potassium: 4.2 mEq/L (ref 3.5–5.1)
Sodium: 140 mEq/L (ref 135–145)
Sodium: 142 mEq/L (ref 135–145)

## 2010-12-04 LAB — COMPREHENSIVE METABOLIC PANEL
ALT: 24 U/L (ref 0–35)
AST: 20 U/L (ref 0–37)
AST: 31 U/L (ref 0–37)
Albumin: 3 g/dL — ABNORMAL LOW (ref 3.5–5.2)
CO2: 27 mEq/L (ref 19–32)
CO2: 25 mEq/L (ref 19–32)
Calcium: 9.1 mg/dL (ref 8.4–10.5)
Chloride: 111 mEq/L (ref 96–112)
Creatinine, Ser: 3 mg/dL — ABNORMAL HIGH (ref 0.4–1.2)
GFR calc Af Amer: 19 mL/min — ABNORMAL LOW (ref >60)
GFR calc Af Amer: 44 mL/min — ABNORMAL LOW (ref >60)
GFR calc non Af Amer: 15 mL/min — ABNORMAL LOW (ref >60)
GFR calc non Af Amer: 37 mL/min — ABNORMAL LOW (ref >60)
Glucose, Bld: 98 mg/dL (ref 70–99)
Sodium: 139 mEq/L (ref 135–145)
Sodium: 141 mEq/L (ref 135–145)
Total Bilirubin: 0.4 mg/dL (ref 0.3–1.2)
Total Protein: 6 g/dL (ref 6.0–8.3)

## 2010-12-04 LAB — GLUCOSE, CAPILLARY
Glucose-Capillary: 114 mg/dL — ABNORMAL HIGH (ref 70–99)
Glucose-Capillary: 131 mg/dL — ABNORMAL HIGH (ref 70–99)
Glucose-Capillary: 157 mg/dL — ABNORMAL HIGH (ref 70–99)
Glucose-Capillary: 90 mg/dL (ref 70–99)
Glucose-Capillary: 92 mg/dL (ref 70–99)
Glucose-Capillary: 94 mg/dL (ref 70–99)
Glucose-Capillary: 95 mg/dL (ref 70–99)
Glucose-Capillary: 96 mg/dL (ref 70–99)
Glucose-Capillary: 98 mg/dL (ref 70–99)
Glucose-Capillary: 98 mg/dL (ref 70–99)
Glucose-Capillary: 104 mg/dL — ABNORMAL HIGH (ref 70–99)
Glucose-Capillary: 104 mg/dL — ABNORMAL HIGH (ref 70–99)
Glucose-Capillary: 105 mg/dL — ABNORMAL HIGH (ref 70–99)
Glucose-Capillary: 110 mg/dL — ABNORMAL HIGH (ref 70–99)
Glucose-Capillary: 111 mg/dL — ABNORMAL HIGH (ref 70–99)
Glucose-Capillary: 116 mg/dL — ABNORMAL HIGH (ref 70–99)
Glucose-Capillary: 120 mg/dL — ABNORMAL HIGH (ref 70–99)
Glucose-Capillary: 121 mg/dL — ABNORMAL HIGH (ref 70–99)
Glucose-Capillary: 121 mg/dL — ABNORMAL HIGH (ref 70–99)
Glucose-Capillary: 123 mg/dL — ABNORMAL HIGH (ref 70–99)
Glucose-Capillary: 123 mg/dL — ABNORMAL HIGH (ref 70–99)
Glucose-Capillary: 140 mg/dL — ABNORMAL HIGH (ref 70–99)
Glucose-Capillary: 145 mg/dL — ABNORMAL HIGH (ref 70–99)
Glucose-Capillary: 79 mg/dL (ref 70–99)
Glucose-Capillary: 87 mg/dL (ref 70–99)
Glucose-Capillary: 90 mg/dL (ref 70–99)
Glucose-Capillary: 94 mg/dL (ref 70–99)
Glucose-Capillary: 97 mg/dL (ref 70–99)
Glucose-Capillary: 97 mg/dL (ref 70–99)
Glucose-Capillary: 98 mg/dL (ref 70–99)

## 2010-12-04 LAB — CBC
Hemoglobin: 12.6 g/dL (ref 12.0–15.0)
MCHC: 34.3 g/dL (ref 30.0–36.0)
Platelets: 94 10*3/uL — ABNORMAL LOW (ref 150–400)
Platelets: ADEQUATE 10*3/uL (ref 150–400)
RBC: 4.01 MIL/uL (ref 3.87–5.11)
RDW: 14.6 % (ref 11.5–15.5)
WBC: 10.2 10*3/uL (ref 4.0–10.5)

## 2010-12-04 LAB — DIFFERENTIAL
Basophils Absolute: 0 10*3/uL (ref 0.0–0.1)
Eosinophils Absolute: 0.4 10*3/uL (ref 0.0–0.7)
Eosinophils Relative: 4 % (ref 0–5)
Lymphs Abs: 2.8 10*3/uL (ref 0.7–4.0)
Monocytes Absolute: 0.7 10*3/uL (ref 0.1–1.0)

## 2010-12-04 LAB — T4: T4, Total: 8.2 ug/dL (ref 5.0–12.5)

## 2010-12-04 LAB — BUN
BUN: 27 mg/dL — ABNORMAL HIGH (ref 6–23)
BUN: 41 mg/dL — ABNORMAL HIGH (ref 6–23)

## 2010-12-04 LAB — CREATININE, SERUM
Creatinine, Ser: 2.92 mg/dL — ABNORMAL HIGH (ref 0.4–1.2)
GFR calc Af Amer: 30 mL/min — ABNORMAL LOW (ref >60)
GFR calc non Af Amer: 16 mL/min — ABNORMAL LOW (ref >60)

## 2010-12-04 LAB — URINALYSIS, MICROSCOPIC ONLY
Bilirubin Urine: NEGATIVE
Ketones, ur: NEGATIVE mg/dL
Nitrite: NEGATIVE
Urobilinogen, UA: 0.2 mg/dL (ref 0.0–1.0)

## 2010-12-04 LAB — URINE CULTURE

## 2010-12-04 LAB — LIPID PANEL
Cholesterol: 245 mg/dL — ABNORMAL HIGH (ref 0–200)
Total CHOL/HDL Ratio: 5.1 RATIO

## 2010-12-04 LAB — HOMOCYSTEINE: Homocysteine: 16.2 umol/L — ABNORMAL HIGH (ref 4.0–15.4)

## 2010-12-04 LAB — TSH: TSH: 0.412 u[IU]/mL (ref 0.350–4.500)

## 2010-12-05 ENCOUNTER — Ambulatory Visit: Payer: MEDICARE | Admitting: Physical Therapy

## 2010-12-05 LAB — HEMOGLOBIN A1C
Hgb A1c MFr Bld: 6.2 % — ABNORMAL HIGH (ref 4.6–6.1)
Mean Plasma Glucose: 131 mg/dL

## 2010-12-05 LAB — COMPREHENSIVE METABOLIC PANEL
ALT: 11 U/L (ref 0–35)
AST: 15 U/L (ref 0–37)
BUN: 26 mg/dL — ABNORMAL HIGH (ref 6–23)
CO2: 24 mEq/L (ref 19–32)
CO2: 26 mEq/L (ref 19–32)
Calcium: 9.4 mg/dL (ref 8.4–10.5)
Calcium: 9.7 mg/dL (ref 8.4–10.5)
Chloride: 108 mEq/L (ref 96–112)
Creatinine, Ser: 1.45 mg/dL — ABNORMAL HIGH (ref 0.4–1.2)
GFR calc Af Amer: 35 mL/min — ABNORMAL LOW (ref >60)
GFR calc non Af Amer: 35 mL/min — ABNORMAL LOW (ref >60)
Sodium: 141 mEq/L (ref 135–145)
Total Bilirubin: 1.1 mg/dL (ref 0.3–1.2)
Total Protein: 7.2 g/dL (ref 6.0–8.3)

## 2010-12-05 LAB — CBC
HCT: 38.8 % (ref 36.0–46.0)
HCT: 39.6 % (ref 36.0–46.0)
MCHC: 33.5 g/dL (ref 30.0–36.0)
MCHC: 33.8 g/dL (ref 30.0–36.0)
MCV: 84.6 fL (ref 78.0–100.0)
MCV: 84.8 fL (ref 78.0–100.0)
Platelets: 91 10*3/uL — ABNORMAL LOW (ref 150–400)
RBC: 4.67 MIL/uL (ref 3.87–5.11)
RDW: 14.8 % (ref 11.5–15.5)
WBC: 10.3 10*3/uL (ref 4.0–10.5)

## 2010-12-05 LAB — DIFFERENTIAL
Basophils Relative: 0 % (ref 0–1)
Eosinophils Relative: 1 % (ref 0–5)
Lymphs Abs: 2.4 10*3/uL (ref 0.7–4.0)
Monocytes Absolute: 0.5 10*3/uL (ref 0.1–1.0)

## 2010-12-05 LAB — TROPONIN I: Troponin I: 0.04 ng/mL (ref 0.00–0.06)

## 2010-12-05 LAB — CARDIAC PANEL(CRET KIN+CKTOT+MB+TROPI): Troponin I: 0.04 ng/mL (ref 0.00–0.06)

## 2010-12-05 LAB — APTT: aPTT: 25 seconds (ref 24–37)

## 2010-12-05 LAB — GLUCOSE, CAPILLARY

## 2010-12-05 LAB — FOLATE: Folate: 16.9 ng/mL

## 2010-12-09 ENCOUNTER — Ambulatory Visit: Payer: MEDICARE | Admitting: Physical Therapy

## 2010-12-13 ENCOUNTER — Ambulatory Visit: Payer: MEDICARE | Admitting: Physical Therapy

## 2010-12-15 ENCOUNTER — Ambulatory Visit: Payer: MEDICARE | Admitting: Physical Therapy

## 2010-12-19 ENCOUNTER — Ambulatory Visit: Payer: MEDICARE | Admitting: Physical Therapy

## 2010-12-21 ENCOUNTER — Ambulatory Visit: Payer: MEDICARE | Admitting: Physical Therapy

## 2010-12-21 ENCOUNTER — Other Ambulatory Visit (HOSPITAL_COMMUNITY): Payer: Self-pay | Admitting: Urology

## 2010-12-21 DIAGNOSIS — D499 Neoplasm of unspecified behavior of unspecified site: Secondary | ICD-10-CM

## 2010-12-26 ENCOUNTER — Ambulatory Visit: Payer: MEDICARE | Admitting: Physical Therapy

## 2010-12-27 ENCOUNTER — Encounter: Payer: MEDICARE | Attending: Physical Medicine & Rehabilitation

## 2010-12-27 ENCOUNTER — Ambulatory Visit (HOSPITAL_BASED_OUTPATIENT_CLINIC_OR_DEPARTMENT_OTHER): Payer: MEDICARE | Admitting: Physical Medicine & Rehabilitation

## 2010-12-27 DIAGNOSIS — G8110 Spastic hemiplegia affecting unspecified side: Secondary | ICD-10-CM

## 2010-12-27 DIAGNOSIS — I69959 Hemiplegia and hemiparesis following unspecified cerebrovascular disease affecting unspecified side: Secondary | ICD-10-CM | POA: Insufficient documentation

## 2010-12-28 NOTE — Assessment & Plan Note (Signed)
REASON FOR VISIT:  Problems opening up the right hand.  A 77 year old female, right spastic hemiplegia due to CVA onset February 24, 2009.  She has incidental finding of right parietal meningioma.  She has a history of subacromial bursitis improved after right shoulder injection.  She has had right upper extremity spasticity.  The Botox injection performed over a year ago helped open up her hand, but I did not have repeat, she was not wanting new injections at that time.  She has gone through physical therapy, feels like this has been helpful for ambulation.  She still needs assist with dressing, bathing, household duties, shopping, but is walking better with her quad cane and is walking to and from the car to my office.  She is widowed, lives with her son, is with her today, but in the waiting room rather than in the exam room  this time.  PHYSICAL EXAMINATION:  VITAL SIGNS:  Blood pressure 138/65, pulse 63, respirations 18, O2 sat 98% on room air. MUSCULOSKELETAL:  Her right upper extremity has no evidence of swelling. No erythema.  No hypersensitivity to touch, with grade 3 spasticity right flexor forearm muscles.  IMPRESSION:  Right spastic hemiplegia, left cerebrovascular accident. She has had good results with Botox in the past.  She is open to having a repeat injection.  I did explain to her that it would be about every 3 months to reinject given that the usual duration of response, may need to do some splinting as well depending on response.  Discussed with patient, agrees with plan.  I will see her back to 3 weeks.     Erick Colace, M.D. Electronically Signed    AEK/MedQ D:  12/27/2010 16:13:27  T:  12/28/2010 01:53:57  Job #:  [id number]  cc:   Emeterio Reeve, MD Fax: [phone]

## 2010-12-29 ENCOUNTER — Ambulatory Visit: Payer: MEDICARE | Admitting: Physical Therapy

## 2011-01-10 NOTE — Discharge Summary (Signed)
NAMEELSIE, BAYNES                 ACCOUNT NO.:  [id number]   MEDICAL RECORD NO.:  [id number]          PATIENT TYPE:  INP   LOCATION:  3004                         FACILITY:  [facility]   PHYSICIAN:  Hollice Espy, M.D.DATE OF BIRTH:  12/04/1933   DATE OF ADMISSION:  02/24/2009  DATE OF DISCHARGE:                               DISCHARGE SUMMARY   DATE OF DISCHARGE:  Anticipated March 01, 2009.   ATTENDING PHYSICIAN:  Hollice Espy, MD   PRIMARY CARE PHYSICIAN:  Emeterio Reeve, MD   CONSULTANTS:  Pramod P. Pearlean Brownie, MD, neurology stroke service.   DISCHARGE DIAGNOSES:  1. Acute CVA (cerebrovascular accident) of the left paramedian pontine      infarct felt to be secondary to small vessel disease.  2. Incidental right parietal meningioma.  3. Hypertension.  4. Bradycardia.  5. Acute on chronic renal insufficiency.  6. Hyperlipidemia.  7. Diabetes mellitus.   DISCHARGE MEDICATIONS:  For this patient are as follows:  1. Norvasc 10 mg p.o. daily.  2. Aspirin 325 p.o. daily.  3. Clonidine 0.1 mg p.o. b.i.d.  4. Hydralazine 50 mg p.o. four times a day.  5. Zocor 20 mg p.o. q.h.s.  6. Xanax 0.5 mg p.o. q.h.s.  7. The patient previously was on Cardura 2 mg and lisinopril 40 mg;      these medications are being discontinued for now.   In addition, in regards to her diagnosis of diabetes mellitus which is a  new diagnosis because of her renal function metformin cannot be given.  She has borderline elevated blood sugars and there is a concern about  hypoglycemia.  Will monitor her blood sugars as an outpatient and follow  up with an A1c to see if she needs to be on diet control versus  additional medication.   HOSPITAL COURSE:  The patient is a 77 year old African American female  who presented with slurred speech, facial droop and a right hemiplegia.  She was found, suspected to have an acute stroke.  Because of the time  that she was brought in she had missed the window of  opportunity for t-  PA.  CT scan of the head was done which noted some chronic small vessel  disease and no acute intracranial abnormality.  However, the MRI that  she had done noted a large acute left paramedian pontine infarct.  This  was felt to be secondary to small vessel disease.  The patient had  carotid Dopplers done and she was noted to have a right-sided 60-80%  carotid artery stenosis but the left side was intact.  In discussion  with Dr. Pearlean Brownie he felt that he would not recommend anticoagulation or  carotid endarterectomy on the right side.  In the meantime he  recommended increasing her aspirin from 81 up to 325.  Her blood  pressure medications, initially when she first presented her blood  pressure was quite elevated with a systolic blood pressure greater than  200.  However, with adjustments in her medications her blood pressure  started to come down.  She had some episodes of  bradycardia leading to  adjustments in her clonidine that had been started for acute elevated  blood pressure.  In addition she has had episodes of bradycardia with  heart rates down to as low as 45.  TSH has been ordered and is currently  pending.  The patient does have a history of goiter and thyromegaly.  In  the meantime, PT/OT evaluated the patient and they recommended inpatient  rehab versus skilled nursing.  Because no beds available the plan will  be for the patient to go skilled nursing facility hopefully as early as  Monday, July 5.  The patient was evaluated by speech therapy who found  her to have some mild dysphagia and recommended downgrading her diet to  a dysphagia 3 diet with thin liquids.   LABS:  The patient had a lipid profile checked and was found to have an  LDL of 178.  She was started on Zocor 20.  Repeat lipid profile will be  recommended in about 3 months.  The patient's overall disposition from  initial presentation is some mild improvement.  She will need extensive   physical therapy and followup by speech therapy.   DISCHARGE DIET:  Will be dysphagia 3 diet.   ACTIVITY:  Will be as per skilled nursing.   FOLLOWUP:  She will follow up with Dr. Janalyn Shy P. Sethi in 6 months and  Dr. Mila Palmer in 2-3 weeks after discharge from rehab.      Hollice Espy, M.D.  Electronically Signed     SKK/MEDQ  D:  02/28/2009  T:  02/28/2009  Job:  [id number]   cc:   Emeterio Reeve, MD

## 2011-01-10 NOTE — Assessment & Plan Note (Signed)
Ms. Cassie Peters follows up today.  She had a left pontine CVA onset February 24, 2009, right-sided weakness, incidental finding of right parietal  meningioma.  A 2-D echo showed no evidence of embolism.  Carotid  Dopplers also showed right ICA stenosis 60-80%.   PAST HISTORY:  Significant for hypertension, chronic kidney disease,  partial nephrectomy, bradycardia.  Her hypertension has been difficult  to manage.   She completed inpatient rehabilitation stay on March 30, 2009.  She was  at a mod assist for her mobility when she left the hospital.   She is continued on outpatient therapy at [facility],  [place], and speech.  She is now ambulating with min assist without assistive  device.  She is getting speech therapy as well as OT as well.   She has no significant pain problems.   She has had one fall, but no significant injury.   MEDICATIONS:  Please see nursing list.  She does remain on Ritalin 2.5  b.i.d.  She has had no drowsiness.   PHYSICAL EXAMINATION:  EXTREMITIES:  Ambulation is with a AFO.  She  takes a few steps with min assist.  She goes from sit to stand with just  contact guard assist.  Her lower extremity strength is 5/5 on the left  side.  On the right side is 0 at the ankle and 3- at the hip flexor,  knee extensor.  In the left upper extremity, she is 5/5 in the right  upper extremity.  She is 2-/5 in the deltoid, biceps, triceps grip as  well as finger flexor extensors.  She has hyperreflexia on the right  side compared to the left side.   Passive range of motion is good at the hip, knee, and shoulder as well  as hand and elbow.   Speech is mildly dysarthric.  She has a right facial droop.   IMPRESSION:  Left pontine infarct with right hemiparesis overall  improving functionally as well as from a motor strength standpoint.   PLAN:  Continue outpatient PT, OT, speech.  I will see her back in about  6 weeks.  She should be finished up by that time.  We  will ask her to  stop her Ritalin, other medications to be refilled per primary care.      Erick Colace, M.D.  Electronically Signed     AEK/MedQ  D:  04/27/2009 16:26:44  T:  04/28/2009 05:19:37  Job #:  [id number]   cc:   Emeterio Reeve, MD  Fax: [phone]

## 2011-01-10 NOTE — H&P (Signed)
NAME:  MITRA, DULING NO.:  [id number]   MEDICAL RECORD NO.:  [id number]          PATIENT TYPE:  EMS   LOCATION:  ED                           FACILITY:  [facility]   PHYSICIAN:  Hollice Espy, M.D.DATE OF BIRTH:  12/04/1933   DATE OF ADMISSION:  02/24/2009  DATE OF DISCHARGE:                              HISTORY & PHYSICAL   CHIEF COMPLAINT:  Right-sided weakness.   HISTORY OF PRESENT ILLNESS:  Ms. Rojero is a 77 year old female who  developed weakness and feeling hot at 11:30 a.m. yesterday while  outside.  Patient went into the house.  She took a shower and was  feeling better.  She went to bed.  This morning, she woke up and felt  dizzy, could not walk straight.  Her speech was slurred.  She was  brought to the [facility] ED by family.   ALLERGIES:  No known drug allergies.   PAST MEDICAL HISTORY:  1. Bilateral renal masses diagnosed with renal cell carcinoma, status      post partial nephrectomy.  2. Goiter, status post thyroid biopsy.  3. Hypertension.  4. Dyslipidemia.  5. Partial hysterectomy.   SOCIAL HISTORY:  The patient is widowed.  She quit smoking in 1993,  denies alcohol.  She is retired from lab work in the past.   FAMILY HISTORY:  Mom is deceased secondary to liver disease.  Dad is  deceased secondary to hypertension and CVA.   MEDICATIONS:  1. Norvasc 10 mg p.o. daily.  2. Lisinopril/hydrochlorothiazide 20/12.5 two tabs p.o. daily.  3. Clonidine 0.1 mg p.o. t.i.d.   REVIEW OF SYSTEMS:  GENERAL:  Denies fever or chills.  CARDIOVASCULAR:  Denies chest pain.  RESPIRATORY:  Positive cough, positive chest  congestion.  ABDOMEN:  No nausea, vomiting, diarrhea.  PSYCHIATRIC:  No  depression, no anxiety.  MUSCULOSKELETAL:  No muscle or joint pain.  NEURO:  No vision pain, no numbness.  HEMATOLOGIC:  No bleeding, no  bruising.  SKIN:  No rashes, no lesions.  LYMPH:  Denies swollen glands.   LABORATORIES/RADIOLOGY:  Sodium 141, potassium  3.9, chloride 108, bicarb  24, BUN 34, creatinine 1.71, glucose 108, white blood cell count 12.1,  hemoglobin 13, hematocrit 38.8, MCV 91.  INR 1, AST/ALT normal, troponin  0.34.  CT of head:  No acute abnormality, chronic small vessel disease,  a 1.6 cm right parietal meningioma.   PHYSICAL EXAMINATION:  VITAL SIGNS:  BP 183/70, heart rate 50,  respiratory rate 18, temp 98.1, O2 saturation 99% on 2 L.  GENERAL:  Awake, alert, elderly African American female in no acute  distress.  NECK:  No masses.  HEENT:  Positive dentures.  Moist oral mucosa.  CARDIOVASCULAR:  S1, S2, regular rate and rhythm.  EXTREMITIES:  No lower extremity edema.  RESPIRATORY:  Breath sounds are clear to auscultation bilaterally  without wheezes, rales or rhonchi.  No increased work of breathing.  ABDOMEN:  Soft, nontender, nondistended, positive bowel sounds.  PSYCHIATRIC:  Alert and oriented x3, calm and pleasant.  SKIN:  No rashes, no lesions.  NEURO:  Mild dysarthria, positive right-sided racial droop, right upper  extremity and right lower extremity 4/5 out of 5+ strength.  Left upper  extremity and left lower extremity 5/5+ strength.  Extraocular movements  are intact.   ASSESSMENT/PLAN:  1. Probable subacute left-sided cerebrovascular accident.  CT head is      negative.  For complete stroke workup to include MRI/MRA, carotid      duplex, fasting lipid profile, hemoglobin A1c.  Place in telemetry      bed, physical therapy and occupational therapy evaluation.      Transfer the patient to [facility] for further neuro      evaluation over there.  Dr. Thad Ranger is consulting.  2. History of renal cell carcinoma and chronic renal insufficiency.      Creatinine 1.9 in 2009, 1.7 today.  Patient sees Dr. Kathrene Bongo.      Currently stable.  3. Hypertension uncontrolled.  Continue home medications for now.      Avoid aggressive lowering of blood pressure in patient with      cerebrovascular  accident.  4. History of goiter.  Will check TSH.  5. Hyperlipidemia.  Check fasting lipid profile, not on statin prior      to admission.  6. Thrombocytopenia, question etiology.  Monitor platelets on Lovenox.      Check CBC in a.m.  7. Right parietal meningioma.  Question new finding.  Plan for      outpatient followup.      Sandford Craze, NP      Hollice Espy, M.D.  Electronically Signed    MO/MEDQ  D:  02/24/2009  T:  02/24/2009  Job:  [id number]   cc:   Emeterio Reeve, MD  Fax: [phone]

## 2011-01-10 NOTE — Discharge Summary (Signed)
NAMELETANYA, Cassie Peters                 ACCOUNT NO.:  [id number]   MEDICAL RECORD NO.:  [id number]          PATIENT TYPE:  INP   LOCATION:  1406                         [facility]ACILITY[facility]   PHYSICIAN:  Heloise Purpura, MD      DATE OF BIRTH:  12/04/1933   DATE OF ADMISSION:  08/06/2008  DATE OF DISCHARGE:  08/09/2008                               DISCHARGE SUMMARY   ADMISSION DIAGNOSES:  1. Bilateral renal masses.  2. Chronic kidney disease.   DISCHARGE DIAGNOSES:  1. Bilateral renal masses.  2. Chronic kidney disease.   HISTORY AND PHYSICAL:  For full details, please see admission history  and physical.  Briefly, Cassie Peters is a 77 year old female who was  incidentally found to have bilateral enhancing renal masses concerning  for renal malignancy.  After a thorough preoperative evaluation and  exclusion of metastatic disease, she elected to proceed with surgical  treatment of her multiple right renal masses as the initial step in her  treatment.  The potential risks, complications, and alternative options  associated with proceeding with a minimally invasive partial nephrectomy  were discussed with the patient as well as the significant risk for open  surgical conversion.   HOSPITAL COURSE:  On August 06, 2008, the patient was taken to the  operating room and underwent a right robotically assisted laparoscopic  partial nephrectomy of her 2 concerning renal masses.  She tolerated  this procedure well and without complications.  Postoperatively, she was  able to be transferred to a regular hospital room following recovery  from anesthesia.  Her labile hypertension was managed with her routine  regimen of antihypertensives.  She remained hemodynamically stable and  was maintained on strict bedrest overnight.  Her serum creatinine did  increase after surgery, and over the course of the first 48 hours, it  did increase to 1.92 where it seemed to stabilize.  On postoperative day  #1, she  was advanced to a clear liquid diet which she tolerated well.  She was able to begin ambulating which she did with relative ease as  well.  By postoperative day #2, she was able to be transitioned to oral  pain medication and began ambulating with less difficulty.  Her Foley  catheter was removed as her creatinine had stabilized.  The fluid from  her perinephric drain was sent for a creatinine level and found to be  consistent with serum at 2.  She maintained excellent urine output with  minimal output from her perinephric drain, and it was, therefore,  removed on the morning of postoperative day #3.  By postoperative day  #3, she had been passing flatus and had return of bowel function.  She  was advanced to a regular diet and tolerated this well.  She was,  therefore, able to be discharged home in excellent condition on  postoperative day #3.   DISPOSITION:  Home.   DISCHARGE MEDICATIONS:  She was instructed to resume all of her regular  home medications.  She was given a prescription to take Vicodin as  needed for pain and Colace as  a stool softener.   DISCHARGE INSTRUCTIONS:  She was instructed to be ambulatory but  specifically told to refrain from any heavy lifting, strenuous activity,  or driving.  She was instructed to resume her diet as before surgery.   FOLLOWUP:  She will follow up in approximately 1 week for further  postoperative evaluation.  Her renal function will be rechecked at that  visit, and we will begin discussing management of her left renal mass  pending her pathology which at the time of her discharge has not  returned.  In addition, I would like to check a renal function  approximately 6-8 weeks out from surgery to help determine the best  course of action for treatment of her enhancing centrally located left  renal mass.  Likely, this will involve ablative surgery or nephron-  sparing surgery or possibly even observation.      Heloise Purpura, MD   Electronically Signed     LB/MEDQ  D:  08/09/2008  T:  08/09/2008  Job:  [id number]   cc:   Cecille Aver, M.D.  Fax: [phone]

## 2011-01-10 NOTE — Discharge Summary (Signed)
Cassie Peters, Cassie Peters                 ACCOUNT NO.:  [id number]   MEDICAL RECORD NO.:  [id number]          PATIENT TYPE:  INP   LOCATION:  2924                         FACILITY:  [facility]   PHYSICIAN:  Corinna L. Lendell Caprice, MDDATE OF BIRTH:  12/04/1933   DATE OF ADMISSION:  03/25/2008  DATE OF DISCHARGE:  03/30/2008                               DISCHARGE SUMMARY   DISCHARGE DIAGNOSES:  1. Hypertensive urgency.  2. Renal insufficiency.  3. Thrombocytopenia.  4. Right renal mass.  5. 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  6. Bradycardia, improved.  7. Hypokalemia.  8. Cataracts.   DISCHARGE MEDICATIONS:  1. Cardura 2 mg nightly.  2. Lisinopril 40 mg a day.  3. Norvasc 10 mg a day.  4. Hydralazine 50 mg p.o. q.i.d.   Follow up with Dr. Paulino Rily within 1 week at which time a basic metabolic  panel and CBC should be drawn.  Also consider getting fasting lipid  profile also for blood pressure check.  Follow up with Dr. Sherron Monday  with [facility] on Monday, April 06, 2008, at 11:15 a.m. for  workup of renal mass.   CONDITION:  Stable.   DIET:  Low-salt.   ACTIVITY:  Ad lib.   CONSULTATIONS:  None.   LABORATORY DATA:  On admission, her basic metabolic panel was  significant for a creatinine of 1.4, a potassium of 3.3, otherwise  unremarkable.  At discharge, her potassium is 3.9, BUN is 24, creatinine  is 1.63.  CBC on admission significant for a platelet count of 140.  Her  platelet count dropped to 79,000 on March 28, 2008, and at discharge is  100,000.  Point-of-care enzymes negative.  TSH 0.344, free T4 and total  T3 normal.  Urinalysis was negative for blood, negative protein,  negative nitrite, small leukocyte esterase, 0-2 red cells, and 0-2 white  cells.  Heparin antibody screen negative.   SPECIAL STUDIES/RADIOLOGY:  EKG showed sinus bradycardia with a rate of  56 and LVH  with repolarization abnormality.  Two views of the chest  showed question right thyroid enlargement.  Renal ultrasound showed  suspicion for tumor involving right kidney measuring 3 x 3 x 2.5 cm  right upper pole bilateral slight increase in renal parenchymal  echogenicity.  Renal MRI was ordered but unfortunately not done.  MRA of  the renal arteries showed duplicate left renal arteries both with  atheromatous irregularity then 50% diameter stenosis, also 50% diameter  narrowing of the distal left common iliac artery and focal stenosis of  the origin of the right external iliac artery and at least 50% diameter  stenosis, 4 cm mass involving upper pole of the right kidney.  Echocardiogram showed an ejection fraction of 65-70% severe concentric  hypertrophy and mild aortic valvular regurgitation.   HISTORY AND HOSPITAL COURSE:  Ms. Ruberg is a pleasant 77 year old black  female with a history of hypertension and noncompliance with her  medications.  She was scheduled to get cataract surgery about a month  ago which was cancelled due to a systolic blood pressure of over 200.  She went to see Dr. Paulino Rily for the first time in the office to  establish a primary care physician and was noted to have a blood  pressure of above 200 systolic as well.  When she arrived in the  emergency room, her blood pressure was 280/120 and she received several  doses of labetalol with a decrease in her blood pressure to 221/84 but  also decrease in her pulse into the 40s.  Please see H&P for details.  Her right pedal pulse was diminished.  She complained of some  generalized fatigue but otherwise no chest pain, shortness of breath,  etc. A urinalysis was negative for blood.  She was found to have renal  insufficiency.  She was given hydrochlorothiazide and hydralazine in the  emergency room as well and started on a nitroglycerin drip.  She was  started on oral Norvasc, clonidine, and hydralazine and beta  blockers  and calcium channel blockers were avoided due to the bradycardia.  Her  blood pressure remained difficult to control but at the time of  discharge was ranging 100-160 over about 40-60.  Her potassium was  repleted.  At the time of discharge, she was no longer bradycardic.  The  clonidine had to be stopped due to bradycardia and medications were  adjusted.  She is being discharged on Norvasc 10 mg a day, hydralazine  50 mg p.o. q.i.d., lisinopril 40 mg a day, doxazosin 2 mg nightly and  will need to follow up with Dr. Paulino Rily.  Due to the renal  insufficiency, a renal ultrasound was done which showed a renal mass as  above and MRI of the kidneys was ordered but unfortunately not done but  they did do an MRA of the renal arteries, results as above and she was  found to have unilateral renal artery stenosis and bilateral iliac  occlusive disease.  She denied any claudication but she may need  arterial Dopplers as an outpatient if she develops symptoms.  The  patient is very anxious to go home and since her blood pressure is  better, she can have further workup of her renal mass as an outpatient.  I have arranged followup with Dr. Sherron Monday on Monday April 06, 2008,  at 11:15 a.m..   The patient also developed thrombocytopenia.  She had no bleeding.  She  reports having had this problem before and was seen by hematologist but  I have no records available.  Her heparin antibody screen was negative  but her platelets improved off heparin.  She will need close followup  for blood pressure, renal function, and platelet count in addition to  the renal mass.  Also consider getting fasting lipids.   Total time on the day of discharge is 90 minutes.      Corinna L. Lendell Caprice, MD  Electronically Signed     CLS/MEDQ  D:  03/30/2008  T:  03/31/2008  Job:  [id number]   cc:   Emeterio Reeve, MD  Martina Sinner, MD

## 2011-01-10 NOTE — Consult Note (Signed)
NAMEMAIE, KESINGER                 ACCOUNT NO.:  [id number]   MEDICAL RECORD NO.:  [id number]          PATIENT TYPE:  INP   LOCATION:  3004                         FACILITY:  [facility]   PHYSICIAN:  Pramod P. Pearlean Brownie, MD    DATE OF BIRTH:  12/04/1933   DATE OF CONSULTATION:  DATE OF DISCHARGE:                                 CONSULTATION   CHIEF COMPLAINT:  Code stroke.   HISTORY OF PRESENT ILLNESS:  This is a 77 year old female with history  of hypertension.  The patient was last seen normal in her son's yard  approximately 11:00 a.m., yesterday at February 23, 2009.  According to the  son, the patient was standing next to the garage when she suddenly  buckled her right leg and lost her balance to the right side.  Post this  event, the patient was weak on her right side and moving much slower  than usual.  Apparently, she continues like that throughout the night  and woke up this morning, February 24, 2009, and felt continuation of her  unsteadiness.  The patient states that when she got up out of bed to go  to the bathroom, she felt wobbly.  At approximately 9:30, the patient  was standing at the sink and noticed that her right leg gave way again.  The patient yelled out to her son and was unable to walk without  assistance to a chair.  At that time, her son also noted some slight  slurring of her speech, which has now cleared.  The patient was brought  to the emergency room at [facility] and arrived at  approximately 11:57.  At that time, a Code Stroke was called as the  patient's son had described onset of 9:30 in the morning on February 24, 2009.  At that time, CT scan was obtained by 12:10.  Neurology consult  exam was 12:20 at which time, it was obtained that the patient was  actually last seen normal at 11:00 a.m. on the previous day.  No t-PA  was given at this time secondary to onset time being greater than 22  hours.  NIH stroke scale was 3.  Modified Rankin was 1.   PAST  MEDICAL HISTORY:  Hypertension.  Stroke risk factors include  hypertension.   MEDICATIONS:  The patient is on clonidine 0.1 mg t.i.d. and son is  obtaining the other 2 medications at this time.   ALLERGIES:  No allergies known.   FAMILY HISTORY:  CVA and hypertension.   SOCIAL HISTORY:  The patient lives at home with her son and daughter-in-  law, does not smoke, drink, or do illicit drugs.   REVIEW OF SYSTEMS:  Negative with the exception of hypertension and  cataract.   PHYSICAL EXAMINATION:  VITAL SIGNS:  Blood pressure is 169/63, heart  rate is 49, respirations 22, and temperature is 98.1.  LUNGS:  Clear to auscultation bilaterally.  No rhonchi or wheezing.  NECK:  Supple.  Negative for bruits.  CARDIOVASCULAR:  Bradycardiac.  S1 and S2 audible with a systolic  murmur.  ABDOMEN:  Nontender, nondistended, and soft.  Bowel sounds in all 4  quadrant.  NEUROLOGIC:  She is alert.  She is oriented x3.  Carries out one and two-  step commands.  Pupils are equal, round, and reactive accommodating to  light.  Conjugate gaze.  Extraocular muscles are intact.  Visual fields  are grossly intact.  Face, has a slight right facial droop at corner of  the mouth.  Tongue is midline.  Uvula is midline.  There is no  dysarthria or aphasia noted.  Sensation V1-T3 stated to be equal  bilaterally.  Shoulder shrug and head turn equal bilaterally.  COORDINATION:  Finger-to-nose is equal bilaterally in upper extremities,  slightly decreased on the right lower extremities with heel-to-shin.  The patient does have slow, fumbling movements with her right hand and  tapping of her right foot.  Gait was not tested at this time.  MOTOR:  She has a positive drift in the right upper and lower  extremities, which score is 1 on the NIH stroke scale.  The patient's  grip and triceps extension was also weaker on her right.  All other  muscle strengths are 5 that were grossly intact and antigravity in 5/5  with  triceps extension and right shoulder abduction, but decreased.  Deep tendon reflexes are 2+ throughout with bilateral downgoing toes.  No tremor or asterixis or fasciculations were noted.  Bulk and tone  within normal limits.  SENSATION:  Sensation is globally intact throughout with no extinction.   LABORATORY:  At present time, PT/INR and PTT are pending.  White blood  cell count is 12.1, hemoglobin/hematocrit 13.0 and 38.8, platelet count  is 91.  Sodium is 141, potassium is 3.9, chloride 108, CO2 is 24,  glucose is 108, BUN is 34, and creatinine is 171.   EKG shows sinus bradycardia.   CT shows negative for any acute stroke; however, she does have a 1.6 cm  right parietal meningioma.   ASSESSMENT AND PLAN:  At this time, a 77 year old African American  female with left subacute infarct, cause unknown and etiology unknown;  however, most likely small vessel disease and she has hypertension.  She  will need a full stroke workup and would highly recommend the patient be  transferred to [facility].     ______________________________  Felicie Morn, PA-C    ______________________________  Sunny Schlein. Pearlean Brownie, MD    DS/MEDQ  D:  02/24/2009  T:  02/25/2009  Job:  [id number]   cc:   Dr. Thad Ranger

## 2011-01-10 NOTE — H&P (Signed)
NAME:  Cassie Peters, Cassie Peters                 ACCOUNT NO.:  [id number]   MEDICAL RECORD NO.:  [id number]          PATIENT TYPE:  EMS   LOCATION:  MAJO                         FACILITY:  [facility]   PHYSICIAN:  Corinna L. Lendell Caprice, MDDATE OF BIRTH:  12/04/1933   DATE OF ADMISSION:  03/25/2008  DATE OF DISCHARGE:                              HISTORY & PHYSICAL   CHIEF COMPLAINT:  High blood pressure.   HISTORY OF PRESENT ILLNESS:  Cassie Peters is a 77 year old black female  with a history of hypertension and noncompliance who presents to the  emergency room after having been seen for the first time by Dr. Paulino Rily  in the office today.  She had been on Toprol and hydrochlorothiazide in  the past, but has not taken this for several years and she sometimes  feels dizzy.  She has been fatigued and dyspneic on exertion, and she  has had no chest pain.  She was scheduled for cataract surgery about a  month ago and it was cancelled due to her uncontrolled high blood  pressure.  She had a blood pressure of 280/120 in the emergency room.  I  am told by the ED physician that her diastolic was up to 168, but I do  not see this documented.   PAST MEDICAL HISTORY:  Hypertension and cataracts.   MEDICATIONS:  None.   ALLERGIES:  None.   SOCIAL HISTORY:  She quit smoking in the 1990s.  She does not drink.  She is here with her son.   FAMILY HISTORY:  Significant for stroke in her father and her mother had  some type of cancer.   PAST SURGICAL HISTORY:  Hysterectomy for fibroids and cataract surgery.   REVIEW OF SYSTEMS:  As above, otherwise negative.   PHYSICAL EXAMINATION:  VITAL SIGNS:  Temperature is 97.2, blood pressure  initially 280/120, currently 221/84, pulse initially 56 and has dropped  as low as high 40s, respiratory rate 19, and oxygen saturation 97% on  room air.  GENERAL:  The patient is well nourished, well developed, and in no acute  distress.  HEENT:  Normocephalic, atraumatic.  Pupils  equal, round, and reactive to  light.  Difficult to do funduscopic exam.  She has moist mucous  membranes.  NECK:  Supple.  No carotid bruits.  No JVD.  No thyromegaly.  LUNGS:  Clear to auscultation bilaterally without wheezes, rhonchi, or  rales.  CARDIOVASCULAR:  Regular rate and rhythm without murmurs, gallops, or  rubs.  ABDOMEN:  Normal bowel sounds, soft, nontender, and nondistended.  GU/RECTAL:  Deferred.  EXTREMITIES:  No clubbing, cyanosis, or edema.  SKIN:  No rash.  PSYCHIATRIC:  Normal affect.  NEUROLOGIC:  Alert and oriented.  Cranial nerves and sensorimotor exam  were intact.   LABORATORY DATA:  CBC unremarkable.  Basic metabolic panel significant  for a potassium of 3.3 and creatinine 1.4.  Chest x-ray shows question  right thyroid enlargement.  EKG shows sinus bradycardia with LVH and  strain.   ASSESSMENT AND PLAN:  1. Hypertensive urgency.  The patient has received a total of  40 mg of      IV labetalol.  She has also received 10 mg of IV hydralazine and 25      mg of p.o. hydrochlorothiazide.  She has been started on a      nitroglycerin drip.  I will continue the nitroglycerin drip.  Start      Norvasc, clonidine, and p.o. hydralazine.  I will get an      echocardiogram since she has been fatigued and dyspneic on      exertion.  I will also get a TSH.  She will be on a low-salt diet.  2. Bradycardia.  This will be monitored and avoid calcium channel      blocker or beta blocker.  3. Hypokalemia.  This will be repleted.  4. Renal insufficiency.  Baseline creatinine unknown.  This will be      monitored.  I will also check a UA.  She will also be on DVT      prophylaxis.      Corinna L. Lendell Caprice, MD  Electronically Signed     CLS/MEDQ  D:  03/25/2008  T:  03/26/2008  Job:  [id number]   cc:   Emeterio Reeve, MD

## 2011-01-10 NOTE — H&P (Signed)
Cassie Peters, Cassie Peters NO.:  [id number]   MEDICAL RECORD NO.:  [id number]          PATIENT TYPE:  IPS   LOCATION:  4031                         FACILITY:  [facility]   PHYSICIAN:  Ranelle Oyster, M.D.DATE OF BIRTH:  12/04/1933   DATE OF ADMISSION:  03/02/2009  DATE OF DISCHARGE:                              HISTORY & PHYSICAL   PRIMARY PHYSICIAN:  Dr. Gordy Levan, [place], [place].   CHIEF COMPLAINT:  Right-sided weakness and slurred speech.   HISTORY OF PRESENT ILLNESS:  This is a 77 year old African American  female with hypertension and chronic kidney disease, followed by Dr.  Kathrene Bongo who was admitted on February 24, 2009, with dizziness and  slurred speech and right-sided weakness.  Head CT was without acute  abnormalities.  There is a 1.6-cm right parietal meningioma and chronic  small vessel disease.  Echocardiogram was unremarkable.  MRI/MRA of the  brain showed a large acute left paramedian pontine infarct and a non-  stenotic plaque at the left side of the basilar artery.  Carotid  Dopplers showed right-sided 60-80% stenosis.  The patient has been  placed on aspirin for stroke prophylaxis.  The patient had problems  during this hospitalization with bradycardia.  TSH was within normal  limits.  She is noted to have depressed mood and therapies have continue  with the patient displaying significant right-sided deficits.  The  patient has been on D3 diet secondary to oral motor control problems.  Rehab was consulted and saw the patient on February 25, 2009, and felt that  ultimately she could benefit from inpatient admission and the patient  was brought here today ultimately.   REVIEW OF SYSTEMS:  Notable for weakness, labile mood with depression  and some dysphagia.  She reports occasional insomnia.  Other pertinent  positives above and full reviews in the written H&P.   PAST MEDICAL HISTORY:  Positive for hypertension, hysterectomy, excised  bilateral renal  masses, right partial nephrectomy, chronic kidney  disease, thyroid goiter, LVH with mild AVR, and bradycardia.   FAMILY HISTORY:  Positive for stroke.   SOCIAL HISTORY:  The patient lives with her son at a 1-level house, 2-3  steps to enter.  Son does housework and yard work.  She quit tobacco in  the 1990s and does not drink.   ALLERGIES:  None.   HOME MEDICATIONS:  Hydralazine, aspirin, Norvasc, Cardura, and  lisinopril.   LABORATORY DATA:  Hemoglobin 13, white count 12.1, and platelets 91,000.  Sodium 140, potassium 4, BUN 37, and creatinine 1.77.  Hemoglobin A1c  6.2.   PHYSICAL EXAMINATION:  VITAL SIGNS:  Blood pressure is 151/70, pulse 56,  respiratory rate 20, and temperature 97.9.  GENERAL:  The patient is generally pleasant in no acute distress.  HEENT:  Pupils are equal, round, and reactive to light and  accommodation. Ear, nose, and throat exam is notable for full set of  dentures.  Mucosa is pink and moist.  NECK:  Supple without JVD or lymphadenopathy.  CHEST:  Clear to auscultation bilaterally without wheezes, rales, or  rhonchi.  HEART:  Regular  rate and rhythm without murmurs, rubs, or gallops.  EXTREMITIES:  No clubbing, cyanosis, or edema.  ABDOMEN:  Soft and nontender.  Bowel sounds are positive.  NEUROLOGICAL:  Cranial nerves II-XII revealed right central VII and  tongue deviation.  She had substantial dysarthria.  No frank visual  deficits are noted today.  Reflexes are hyperactive at 3+ in the right  upper and lower extremities today and 1+ on the left.  Sensation is  grossly intact in all 4 extremities.  The patient's strength was 5/5 in  the left upper and lower extremities.  Right upper and lower extremities  were 0/5 today, proximal to distal.  Judgment and orientation are  generally appropriate as was memory.  Mood was quite labile and patient  often teared up and cried with minimal emotional stimulus.   POST-ADMISSION PHYSICIAN EVALUATION:  1.  Functional deficit secondary to left paramedian pontine stroke with      right hemiparesis, dysarthria, and facial weakness.  The patient      also has emotional lability.  2. The patient is admitted to receive collaborative interdisciplinary      care between the physiatrist, rehab nursing staff, and therapy      team.  3. The patient's level of medical complexity and substantial therapy      needs in the context of that medical necessity cannot be provided      at a lesser intensity of care.  4. The patient has experienced substantial functional loss from her      baseline.  Upon functional assessment at the time of preadmission      screening, the patient was total assist for basic transfers,      ambulation, and self-care.  In the last 24 hours, she is mod assist      to sit on the edge of bed, total assist (40%) for transfers, max      assist for left upper extremity for performance of ADLs, min assist      for feeding.  Pre-morbidly, she was independent.  Judging by the      patient's diagnosis, physical exam, and functional history, she has      displayed the ability to make functional progress which will result      in measurable gains while inpatient rehab.  These gains will be of      substantial and practical use upon discharge to home in      facilitating mobility and self-care.  Interim changes since our      Rehab consult are detailed above.  5. Physiatrist will provide 24-hour management of medical needs as      well as oversight of the therapy plan/treatment and provide      guidance as appropriate regarding interaction of the two.  Medical      problem list and plan are below.  6. The 24-Hour Rehab Nursing will assist in management of the      patient's nutritional needs as well as skin care, emotional      support, medication administration, bowel and bladder function,      integration of therapy concepts, techniques, etc.  7. PT will assess to treat for lower  extremity strength, range of      motion, positioning, appropriate splinting and adaptive equipment,      functional mobility, gait, cognitive/perceptual and visual/spatial      education.  Goals overall with mobility are supervision to min      assist.  The  patient may achieve modified independent with      wheelchair mobility.  8. OT will assess and treat for upper extremity use, adaptive      techniques and equipment, neuromuscular reeducation, safety      awareness, and family education with goals modified independent to      min assist.  The patient perhaps may need mod assist with some      lower extremity hygiene tasks.  9. Speech Language Pathology will assess and treat for dysarthria and      education skills with goals modified independent.  10.Case Management and Social Worker will assess and treat for      psychosocial issues and discharge planning.  Neuropsychology also      may assist in mood and adjustment reaction.  11.Team conferences will be held weekly to assess progress towards      goals and to determine barriers to discharge.  12.The patient has demonstrated sufficient medical stability and      exercise capacity to tolerate at least 3 hours of therapy per day      at least 5 days per week.  13.Estimated length of stay is 3 plus weeks.  Prognosis is fair to      good.   MEDICAL PROBLEM LIST AND PLAN:  1. Chronic kidney disease:  We will follow electrolytes on a serial      basis.  Blood pressure has been generally stable at this point.  2. Hyperglycemia:  We will have dietician see the patient for a      dietary education on carb-modified diet.  Most recent hemoglobin      A1c 6.2.  We will cover elevated sugars with sliding scale insulin      for now.  3. Hypertension:  Blood pressure continued to be labile.  The patient      is on Prinivil, Norvasc, Catapres, and hydralazine currently.  We      will need to follow for further adjustment, watching closely for       decrease in heart rate pending on medications adjusted.  4. Situational depression, emotional lability.  Add low-dose Lexapro.      Ask Dr. Leonides Cave for consult as well.  Some of her lability may be      stroke induced and organic in nature.  She states that often when      she cries she is not as upset as her crying may to suggest that she      is.  We will use Xanax p.r.n. for anxiety also.  5. Dyslipidemia:  Zocor.      Ranelle Oyster, M.D.  Electronically Signed     ZTS/MEDQ  D:  03/02/2009  T:  03/03/2009  Job:  [id number]   cc:   Gladstone Pih, Ph.D.

## 2011-01-10 NOTE — Op Note (Signed)
NAME:  Cassie Peters, FIGUEREO NO.:  [id number]   MEDICAL RECORD NO.:  [id number]          PATIENT TYPE:  INP   LOCATION:  1406                         FACILITY:  [facility]   PHYSICIAN:  Heloise Purpura, MD      DATE OF BIRTH:  12/04/1933   DATE OF PROCEDURE:  08/06/2008  DATE OF DISCHARGE:                               OPERATIVE REPORT   PREOPERATIVE DIAGNOSIS:  Bilateral renal masses.   POSTOPERATIVE DIAGNOSIS:  Bilateral renal masses.   PROCEDURES:  1. Cystoscopy.  2. Left retrograde pyelography.  3. Right robotic assisted laparoscopic partial nephrectomy for two      renal masses.   SURGEON:  Dr. Heloise Purpura.   ASSISTANT:  Dr. Duane Boston.   ANESTHESIA:  General.   COMPLICATIONS:  None.   ESTIMATED BLOOD LOST:  75 mL.   INTRAVENOUS FLUIDS:  Lactated Ringer's 3050 mL.   SPECIMENS:  1. Right upper pole renal mass.  2. Interpolar right renal mass.  3. Resection margin from upper pole right renal mass.  4. Deep resection margin of upper pole right renal mass.   DISPOSITION:  Specimens to pathology.   DRAINS:  A #15 Blake retroperitoneal/perinephric drain.   INTRAOPERATIVE FINDINGS:  Retrograde pyelography demonstrated no  evidence for urothelial filling defects of the renal pelvis or ureter.  There was noted be some splaying of the upper pole collecting system of  the left kidney consistent with a parenchymal renal mass.   INDICATIONS:  Cassie Peters is a 77 year old who initially presented to the  hospital a few months ago with hypertensive urgency.  She underwent an  MRA to evaluate her for renal artery stenosis and was found to have  three separate renal lesions concerning for possible renal malignancy.  She underwent a dedicated renal CT scan and MRI which confirmed a  complex cystic upper pole right renal mass, as well as an enhancing  interpolar lateral right renal mass and a very centrally located small  enhancing left renal mass.  All of these  masses were concerning for  renal malignancy.  She underwent a metastatic evaluation which was  negative and a thorough discussion regarding treatment options.  After a  discussion regarding management options for treatment, she elected to  proceed with the above procedure and staged nephron sparing resection of  her masses.  She does have chronic kidney disease likely related to her  history of hypertension and has been seen by Dr. Annie Sable  with [facility].  The potential risks, complications,  and alternative treatment options associated with the above procedures  were discussed in detail and informed consent was obtained.   DESCRIPTION OF PROCEDURE:  The patient was taken to the operating room  and a general anesthetic was administered.  She was given preoperative  antibiotics, placed in the dorsal lithotomy position, and prepped and  draped in the usual sterile fashion.  Next, a preoperative time-out was  performed.  Rigid cystoscopy was then performed which demonstrated a  normal urethra and a systematic examination of the bladder demonstrated  the ureteral orifices to  be in the normal anatomic position and  effluxing clear urine.  There was no evidence for any bladder tumors,  stones, or other mucosal pathology.  Attention then turned to the left  ureteral orifice which was cannulated with a 6-French ureteral catheter.  Contrast was injected and there was no evidence for any urothelial  filling defects in either the ureter or renal pelvis.  There was noted  be some splaying of the upper pole caliceal system consistent with the  patient's known mass.  This mass appeared to be consistent with a  parenchymal mass and not a urothelial lesion.  At this point, the  patient was repositioned in the right modified flank position with care  to pad all potential pressure points.  Her abdomen was prepped and  draped in the usual sterile fashion.  Again, a  preoperative time-out was  performed.  A site was selected just superior and lateral to the  umbilicus in the right upper quadrant for placement of the camera port.  This was placed using a standard open Hassan technique which allowed  entry into the peritoneal cavity under direct vision without difficulty.  A 12 mm port was placed and a pneumoperitoneum was established.  The 0  degrees lens was then used to inspect the abdomen and there was no  evidence for any intra-abdominal injuries.  There were noted to be  extensive adhesions along the lower midline and right lower quadrant  between the omentum and abdominal wall.  The patient did have a history  of a lower midline incision for a hysterectomy which likely resulted in  these adhesions.  An 8 mm port was placed in the right upper quadrant,  as well as a 12 mm port placed in the upper midline.  The aforementioned  adhesions were then taken down sharply with laparoscopic scissors.  Two  additional 8 mm right robotic ports were then placed in the right lower  quadrant and the surgical cart was docked.  With the aid of the cautery  scissors, the white line of Toldt was incised along the length of the  ascending colon and the colon was mobilized medially and the space  between the mesocolon and the anterior layer of Gerota's fascia was  developed.  The gonadal vein and ureter were identified inferiorly to  the kidney and were able to be lifted anteriorly off the psoas muscle.  The ureter and gonadal vein were then traced superiorly and the gonadal  artery and gonadal vein were identified, isolated, and divided between  multiple 5-mm Hem-o-lok clips.  Dissection then proceeded superiorly and  the main renal vein was identified.  With careful dissection, the renal  hilum was isolated and three separate renal arteries were identified  with two located superiorly to the renal vein and located inferiorly and  posteriorly.  The plane between  Gerota's fascia and the renal capsule  was then developed and the kidney was mobilized entirely.  Once the  kidney was freely mobile on just the renal vessels and ureter, it was  able to be flipped medially.  The upper pole renal mass was able to be  identified and appeared to be cystic, but did grossly have the  appearance concerning for renal cell carcinoma.  The patient's mass off  the lateral interpolar region of the kidney was also clearly visualized.  At this point, 12.5 grams of intravenous mannitol was administered.  Preparations were made for clamping of the renal vessels.  The patient's  renal masses were again examined on her preoperative imaging.  Surgicel  bolsters, 2-0 Monocryl double-armed sutures and a 4-0 Vicryl suture with  lap ties placed on the back end of this suture were all placed into the  abdomen.  At this point, the mannitol had been administered for  approximately 15 minutes.  Each of the aforementioned renal arteries  were clamped with the two superior arteries clamped with a single  bulldog and the one inferior renal artery clamped by itself.  The renal  vein was then also clamped with a bulldog clamp.  Attention then turned  to the upper pole mass which was resected with cold scissor resection.  There was noted to be an area of concern for possible margin positivity.  Additional tissue was resected as a margin and an additional deep margin  was also taken.  The base of the kidney was then coagulated with the  argon beam coagulator.  It was not decided to send frozen section  margins due to the patient's chronic kidney disease, and it was felt  that it would not be wise to perform a nephrectomy even in the face of a  positive microscopic margin.  Grossly, all tumor had been resected.  The  collecting system had been entered and a 4-0 Vicryl suture was used to  close the collecting system with lap ties used to secure the suture.  Any arcuate vessels were also  ligated in this fashion.  A Surgicel  bolster was then placed into the defect and secured with a double-armed  2-0 Monocryl suture placed through the renal capsule which was used to  provide compression of the defect.  FloSeal was placed into the base of  the resection site prior to securing the bolster.  Attention then turned  to the lateral tumor which was resected in an identical fashion.  The  defect was then repaired with FloSeal, as well as a Surgicel bolster  which was again secured with a double-armed 2-0 Monocryl suture placed  through the renal capsule.  At this point, the bulldog clamps were all  removed with the renal vein clamp removed first prior to the artery  clamps.  Total warm ischemia time was 35 minutes.  The resected tumors  had been placed into an Endopouch retrieval bag via the 12-mm assistant  port.  The renal hilum, as well as the resected tumor sites were  examined and hemostasis appeared excellent.  A #15 Blake drain was then  brought through the right lower quadrant robotic port and positioned in  the perinephric space.  It was secured to skin with a nylon suture.  The  surgical cart was then undocked.  The robotic ports were removed under  direct vision, as well as the 12 mm ports.  The camera port site was  closed with a 0 Vicryl figure-of-eight suture.  The renal tumors were  then removed within the Endopouch retrieval bag via the upper midline  port site incision.  This fascial opening was closed with a running 0  Vicryl suture.  All port sites were injected with quarter-percent  Marcaine and reapproximated at the skin level with staples.  Sterile  dressings were applied.  The patient appeared to tolerate the procedure  well without complications.  She was able to be extubated and  transferred to the recovery unit in satisfactory condition.      Heloise Purpura, MD  Electronically Signed     LB/MEDQ  D:  08/06/2008  T:  08/06/2008  Job:  [id number]

## 2011-01-10 NOTE — Discharge Summary (Signed)
NAME:  Cassie Peters, LIM NO.:  [id number]   MEDICAL RECORD NO.:  [id number]          PATIENT TYPE:  REC   LOCATION:  [facility]                         FACILITY:  [facility]   PHYSICIAN:  Erick Colace, M.D.DATE OF BIRTH:  12/04/1933   DATE OF ADMISSION:  03/30/2009  DATE OF DISCHARGE:                               DISCHARGE SUMMARY   DISCHARGE DIAGNOSES:  1. Left pontine cerebrovascular accident.  2. For cardiovascular accident, depression.  3. Chronic kidney disease.  4. Acute-on-chronic renal insufficiency, improved with hydration.  5. Hypertension.  6. Intermittent nausea, resolved.   HISTORY OF PRESENT ILLNESS:  Ms. Cassie Peters is a 77 year old female with  history of hypertension and chronic kidney disease, admitted on February 24, 2009, with dizziness, slurred speech and right-sided weakness.  CT of  head done showed no acute abnormality.  A 1.6 cm right parietal  meningioma noted incidentally and chronic small vessel disease noted.  A  2-D echo done showed EF of 65-75% with mild AVR and MVR.  MRI and MRA of  brain showed large acute right paramedian pontine infarct, atrophy with  small vessel disease and non stenotic plaque on left side of basilar  artery.  Carotid Dopplers done showed right 60-80% ICA stenosis.  The  patient was placed on aspirin for CVA prophylaxis.  She has had some  issues with bradycardia and TSH checked was normal at 0.412.  The  patient is noted to have issues with depressed mood.  Therapies were  initiated and the patient is noted to have issues with right lean as  well as impairments in mobility and self-care.  She was evaluated by  rehab and felt that would benefit from inpatient CIR program.   PAST MEDICAL HISTORY:  Significant for:  1. Hypertension.  2. Chronic kidney disease.  3. Right for partial nephrectomy.  4. Thyroid goiter.  5. Excision, bilateral renal masses.  6. Hysterectomy.  7. LVH with mild ADA.  8. Bradycardia.   ALLERGIES:  No known drug allergies.   FAMILY HISTORY:  Positive for CVA.   SOCIAL HISTORY:  The patient lives with son in one-level home with 2-3  steps at entry.  Son does most of the house work and yard work, and is  available for provide 24-hour supervision.  Quit tobacco in 1990s.  Does  not use any alcohol.   FUNCTIONAL HISTORY:  The patient was sedentary, but independent prior to  admission.  Still drives occasionally.   FUNCTIONAL STATUS:  The patient is mod assist to supervision to sit edge  of bed, total assist 40% for transfers with questionable use of left  upper extremity, max assist for ADLs, and min assist for feeding.   PHYSICAL EXAMINATION:  VITAL SIGNS:  Blood pressure 151/70, pulse 56,  respiratory rate 20, and temperature 97.9.  GENERAL:  The patient is well-nourished and well-developed female, noted  with flat and depressed affect, otherwise no acute distress.  HEENT:  Pupils equal, round, reactive to light and accommodation.  EAR, NOSE, AND THROAT:  Full set dentures.  Mucosa is a pink and  moist.  NECK:  Supple without JVD or lymphadenopathy.  CHEST:  Clear to auscultation bilaterally without wheezes, rales, or  rhonchi.  HEART:  Regular rate and rhythm without murmurs or gallops.  ABDOMEN:  Soft and nontender with positive bowel sounds.  NEUROLOGIC:  Cranial nerves II through XII revealed central right VII  with tongue deviation.  She has substantial dysarthria.  No frank visual  deficits.  Reflexes are hyperactive 3+ in right upper and right lower  extremity, 1+ on left.  Sensation grossly intact in all 4 extremities.  Strength is 5/5, left upper and lower extremity; right upper and lower  extremity is 0-5, proximal and distal.  Judgment orientation is  generally appropriate as was memory, mood was quite labile with the  patient often tearing up and crying with minimal emotional stimulants.   HOSPITAL COURSE:  Ms. Cassie Peters was admitted to rehab on March 02, 2009,  for inpatient therapies to consist of PT/OT and speech therapy at least  3 hours 5 days a week.  Past admission physiatrist rehab RN and therapy  team have worked together to provide customized collaborative  interdisciplinary care.  Rehab RN had been working with the patient on  bowel and bladder training as well as on skin care monitoring and  assisting with maintenance of hydration status.  Due to the patient's  labile mood, she was started on low-dose Lexapro.  Xanax was continued  at bedtime and doxazosin.  Neuropsych was consulted to assist in  followup during the patient's stay.  Zocor was added for the patient's  dyslipidemia.  The patient was maintained on D3 diet thin liquids  throughout her stay.  The patient was noted to have slightly elevated  hemoglobin A1c of 6.2 and blood sugars were monitored on a.c. and h.s.  basis initially.  Initially, blood sugars were noted to be at 90s to  140s range.  These did improve to overall 90s range with increase in  mobility and therefore CBG checks were deceased.  The patient's blood  pressures were monitored on b.i.d. basis and were noted to be labile.  BP meds have been adjusted with the patient transition now to Catapres-  TTS patch to help with better blood pressure control.  Blood pressures  at time of discharge ranging from 130s-150s systolic; 60s to 16X  diastolic.   LABORATORIES:  Done past admission showing the patient to have acute and  chronic renal insufficiency.  Labs were done past admission revealing  BUN and creatinine, 22 and 1.41.  CBC revealed H and H at 11.5 and 34.1,  white count was noted be slightly elevated at 10.2.  UA/UC done showed  60,000 colonies of multi species.  Repeat thyroid studies done on March 10, 2009, revealed TSH at 0.929, with T4 at 8.2.  The patient was noted  to have worsening of renal status with BUN rising to 39 and creatinine  to 1.98 and she was treated with IV fluids.  Her renal  status did  improve overall with last check of March 26, 2009, revealing sodium 143,  potassium 3.8, chloride 114, CO2 23, BUN 20, and creatinine 1.87, and  glucose 96.  The patient has had issues with sinus bradycardia with  heart rate noted to be as low as 52.  EKG check showed no evidence of  bradycardia.  Catapres was slowly tapered off, however, due to the  patient with rebound hypertension and Catapres was resumed, and changed  over to  Catapres patch, prior to discharge for better control of blood  pressure.  With resumption of Catapres patch, heart rate currently  ranging in mid 50s to 60s range overall.  The patient has had  intermission issues with nausea, question due to anxiety in elevated  emotion.  She has also had complaints of back pain and was started on  low-dose Ultram to help with her symptomatology.  Due to continued  issues with nausea, Lexapro as well as Ultram was held with improvement  in the patient's GI symptoms.  These were discontinued as the patient's  overall symptoms were noted to improve off this.  She was started on  nortriptyline 25 mg p.o. at bedtime on March 21, 2009, to help with  depression.  She was also started on Reglan 2.5 mg p.o. b.i.d. to help  with activation.   During the patient's stay in rehab, weekly team conferences were held to  monitor the patient's progress, set goals as well as discussed barriers  to discharge.  At time of admission, the patient was limited by decrease  in standing balance, decrease in initiation of right lower extremity  movement resulting in decreased functional independence as well as  issues with right lower extremity tone.  She was also noted to have  depressed mood as well as slight right inattention and problems with  postural control.  Speech therapy eval revealed the patient with  deficits in oral control of solid bolus and dysarthria related to  cranial nerve deficits.  Speech therapy has been working with  the  patient on speech intelligibility as well as oral motor exercises for  facial weakness.  By time of discharge, the patient's intelligibility  has improved to 98-100% at conversation level.  With use of strategy,  she was modified independent for swallow strategies to facilitate safety  with meals.  She is able to execute oral motor exercises independently.  PT eval revealed the patient at mod assist for sit to stand, mod assist  to ambulate 50 feet with the right upper extremity run, the patient  shoulders and facilitation of right hip and knee extension in stance  with total assist with right swing with increased tone noted.  She was  started on trial of Zanaflex to help with tone, however, the patient  reported issues with dizziness and therefore Zanaflex was discontinued.  Physical therapy has been working with the patient on standing dynamic  balance activities with reaching and getting shoes and socks with  intermittent min assist for standing.  She was able to ambulate 150 feet  x2 with rolling walker with right AFO.  She required min assist to  navigate 12 stairs.  She was modified independent for wheelchair  navigation.  Ambulation was also focused in using right AFO with  emphasis on turning and weight shifting to help with balance.  Family  education was done with son and caregiver to standby transfers, car  transfers as well as education regarding wheelchair use.  OT has been  working with the patient on truncal control as well as sitting balance  with emphasis on weight shifting forward for bathing and dressing.  Initially, the patient was max assist for upper body dressing, max  assist for lower body dressing, and mod assist for toileting.  OT has  been working with the patient with focus on attention to right upper  extremity and right lower extremity.  They have also been working on  active weightbearing through right upper extremity  as well as  utilization of right  upper extremity for hemi-dressing with techniques.  The patient was noted to be impulsive with tendency to rush through  tasks putting at high risk for falls.  ADL retraining at shower level  have focused on safety with the patient being cues to slow down.  Family  education was done with the patient's sisters with stimulation of  transfers to shower, and a sheet for ADLs.  Education was also done to  include transfer training,, standing balance as well as safety with  basic self-care task.  The patient will continue with further followup  outpatient PT/OT to begin at [facility] on March 30, 2009.  On March 27, 2009, the patient is discharged to home.   DISCHARGE MEDICATIONS:  1. Ecotrin 325 mg a day.  2. Lisinopril 40 mg a day.  3. Zocor 40 mg per day.  4. Apresoline 50 mg q.i.d.  5. Xanax 0.5 mg at bedtime.  6. Norvasc 10 mg a day.  7. Catapres-TTS patch, change once a week on Thursday.  8. Ritalin 5 mg half p.o. at 7:00 a.m. and at noon daily, #30 Rx.  9. Senokot-S 2 p.o. at bedtime.  10.Tylenol 325 mg 2 p.o. q.8 h. for pain.  11.Protonix 40 mg a day.  12.Pamelor 25 mg at bedtime.   DIET:  Low-fat.   ACTIVITY LEVEL:  24-hour supervision.  No strenuous activity.  No  alcohol, no smoking, no driving.   SPECIAL INSTRUCTIONS:  Do not use Cardura, Prinzide, or Catapres pill  form.   [facility] outpatient PT/OT, speech therapy to begin, Tuesday March 30, 2009, at 8:30 to 10:00 a.m. and April 01, 2009, on 11:15 to 12 p.m.   FOLLOWUP:  The patient to follow up with Dr. Wynn Banker, April 27, 2009,  at 3:00 p.m. for 3:30.  Follow up with Dr. Paulino Rily for routine check in  2 weeks.  Follow up with Dr. Kathrene Bongo, April 14, 2009, 1:15 for  1:45 appointment.      Greg Cutter, P.A.      Erick Colace, M.D.  Electronically Signed    PP/MEDQ  D:  03/31/2009  T:  04/01/2009  Job:  [id number]   cc:   Emeterio Reeve, MD  Pramod P. Pearlean Brownie, MD   Cecille Aver, M.D.

## 2011-01-17 ENCOUNTER — Ambulatory Visit (HOSPITAL_BASED_OUTPATIENT_CLINIC_OR_DEPARTMENT_OTHER): Payer: Medicare Other | Admitting: Physical Medicine & Rehabilitation

## 2011-01-17 DIAGNOSIS — I6330 Cerebral infarction due to thrombosis of unspecified cerebral artery: Secondary | ICD-10-CM

## 2011-01-17 DIAGNOSIS — G8110 Spastic hemiplegia affecting unspecified side: Secondary | ICD-10-CM

## 2011-01-18 NOTE — Procedures (Signed)
NAME:  Cassie Peters, GEVING                 ACCOUNT NO.:  [id number]  MEDICAL RECORD NO.:  [id number]           PATIENT TYPE:  O  LOCATION:  [facility]PC                          FACILITY:  [facility]  PHYSICIAN:  Erick Colace, M.D.DATE OF BIRTH:  12/04/1933  DATE OF PROCEDURE:  01/17/2011 DATE OF DISCHARGE:                              OPERATIVE REPORT  Botox injection into the right flexor forearm muscle.  Dilution is 50 units/mL x200 units total.  INDICATIONS:  Spastic hemiplegia due to CVA.  She has upper extremity spasticity that interferes with hygiene and with dressing activities.  Spasticity only partially responsive to physical therapy and occupational therapy which she is currently receiving.  She has failed conservative care with medications as well.  Informed consent was obtained after describing risks and benefits of the procedure with the patient.  These include bleeding, bruising, and infection.  She has had previous Botox injection.  Has filled out the REMS form.  The patient was placed in seated position.  One area of the right FPL, 2 areas of the right FDS, 2 areas of the right FCR, 1 area of the right FCU, and 1 area of the right FDP were marked and prepped with Betadine and entered with the 25-gauge, 50-mm needle electrode under needle EMG guidance.  After obtaining appropriate EMG activity and after negative drawback for blood, 25 units were placed in the right FPL, 25 into each of two sites in the right FDS, 25 units into each two sites of the right FCR, 25 units to each of two areas of the FDP, and 25 units into one area the right FCU.  The patient tolerated the procedure well. Postprocedure instructions given.  The patient will follow up in 1 month with me.  Continue outpatient therapy.     Erick Colace, M.D. Electronically Signed    AEK/MEDQ  D:  01/17/2011 16:23:18  T:  01/18/2011 05:50:14  Job:  [id number]

## 2011-02-14 ENCOUNTER — Encounter: Payer: Medicare Other | Attending: Physical Medicine & Rehabilitation

## 2011-02-14 ENCOUNTER — Ambulatory Visit (HOSPITAL_BASED_OUTPATIENT_CLINIC_OR_DEPARTMENT_OTHER): Payer: Medicare Other | Admitting: Physical Medicine & Rehabilitation

## 2011-02-14 DIAGNOSIS — G8110 Spastic hemiplegia affecting unspecified side: Secondary | ICD-10-CM

## 2011-02-14 DIAGNOSIS — I69959 Hemiplegia and hemiparesis following unspecified cerebrovascular disease affecting unspecified side: Secondary | ICD-10-CM | POA: Insufficient documentation

## 2011-02-15 NOTE — Assessment & Plan Note (Signed)
REASON FOR VISIT:  Spasms, right arm.  HISTORY:  A 77 year old female who has history of spasticity right upper extremity has done well with Botox injection performed Jan 17, 2011. She is now able to open her hand.  She is using her hand to grasp items. She had no post procedure complications.  She has completed her outpatient physical therapy and is quite pleased that she is walking now with a quad cane rather than a walker.  She has just 1/5 pain in her right side just feels mostly stiff when she first gets up.  She can dress herself.  She needs help with meal prep and household duties.  REVIEW OF SYSTEMS:  Positive for trouble walking, decreased taste.  PAST HISTORY:  Hypothyroidism and hypertension.  SOCIAL HISTORY:  Widow, lives with her son.  FAMILY HISTORY:  Diabetes.  Blood pressure 151/50, pulse 62, respirations 18 and O2 sat 98% on room air.  General, no acute distress.  Mood and affect appropriate.  Her right hand is Ashworth grade 1 finger flexors, wrist flexors are down from 3.  She has Ashworth grade 2 at the elbow.  She is able to grasp and release with her right hand.  She does have a abduction of her little finger.  Her right lower extremity has 0/5 at the ankle dorsiflexor, but 3+ at the hip flexor and 4- at the knee extensor.  IMPRESSION: 1. 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 2. Spasticity improved after Botox injection right upper extremity,     flexor forearm more functional as well as improved hygiene and     grooming.  PLAN:  I will see her back in 2 months to see if the Botox is wearing off at that time.  If it is, we will schedule for another injection. Discussed with the patient, agrees with plan.     Erick Colace, M.D. Electronically Signed    AEK/MedQ D:  02/14/2011 13:11:28  T:  02/15/2011 01:23:43  Job #:  [id number]  cc:   Emeterio Reeve, MD Fax: [phone]

## 2011-03-16 ENCOUNTER — Ambulatory Visit (HOSPITAL_COMMUNITY)
Admission: RE | Admit: 2011-03-16 | Discharge: 2011-03-16 | Disposition: A | Discharge status: Home / Self Care | Payer: Medicare Other | Source: Ambulatory Visit | Attending: Urology | Admitting: Urology

## 2011-03-16 DIAGNOSIS — K7689 Other specified diseases of liver: Secondary | ICD-10-CM | POA: Insufficient documentation

## 2011-03-16 DIAGNOSIS — N289 Disorder of kidney and ureter, unspecified: Secondary | ICD-10-CM | POA: Insufficient documentation

## 2011-03-16 DIAGNOSIS — D499 Neoplasm of unspecified behavior of unspecified site: Secondary | ICD-10-CM

## 2011-03-16 DIAGNOSIS — K863 Pseudocyst of pancreas: Secondary | ICD-10-CM | POA: Insufficient documentation

## 2011-03-16 DIAGNOSIS — K862 Cyst of pancreas: Secondary | ICD-10-CM | POA: Insufficient documentation

## 2011-03-29 ENCOUNTER — Other Ambulatory Visit (HOSPITAL_COMMUNITY): Payer: Self-pay | Admitting: Urology

## 2011-03-29 ENCOUNTER — Ambulatory Visit (HOSPITAL_COMMUNITY)
Admission: RE | Admit: 2011-03-29 | Discharge: 2011-03-29 | Disposition: A | Discharge status: Home / Self Care | Payer: Medicare Other | Source: Ambulatory Visit | Attending: Urology | Admitting: Urology

## 2011-03-29 DIAGNOSIS — C649 Malignant neoplasm of unspecified kidney, except renal pelvis: Secondary | ICD-10-CM | POA: Insufficient documentation

## 2011-03-29 DIAGNOSIS — Z8673 Personal history of transient ischemic attack (TIA), and cerebral infarction without residual deficits: Secondary | ICD-10-CM | POA: Insufficient documentation

## 2011-03-29 DIAGNOSIS — R29898 Other symptoms and signs involving the musculoskeletal system: Secondary | ICD-10-CM | POA: Insufficient documentation

## 2011-03-29 DIAGNOSIS — I10 Essential (primary) hypertension: Secondary | ICD-10-CM | POA: Insufficient documentation

## 2011-04-11 ENCOUNTER — Ambulatory Visit (HOSPITAL_BASED_OUTPATIENT_CLINIC_OR_DEPARTMENT_OTHER): Payer: Medicare Other | Admitting: Physical Medicine & Rehabilitation

## 2011-04-11 ENCOUNTER — Encounter: Payer: Medicare Other | Attending: Physical Medicine & Rehabilitation

## 2011-04-11 DIAGNOSIS — G8110 Spastic hemiplegia affecting unspecified side: Secondary | ICD-10-CM

## 2011-04-11 DIAGNOSIS — I69959 Hemiplegia and hemiparesis following unspecified cerebrovascular disease affecting unspecified side: Secondary | ICD-10-CM | POA: Insufficient documentation

## 2011-04-11 NOTE — Assessment & Plan Note (Signed)
REASON FOR VISIT:  Increasing tightness in the right upper extremity.  HISTORY:  A 77 year old female status post left CVA causing right spastic hemiplegia approximately 2 years ago.  She has been receiving Botox injections and had a good results with last injection on Jan 17, 2011.  She feels like her hand is still doing pretty well slightly tighter at this point.  She has had no new changes in her medical status.  Examination reveals elderly female in no acute distress.  She has 3-/5 strength in deltoid, biceps, triceps, and 2- at the finger flexors.  Her tone is Ashworth grade 1 in the FPL and 2 at the FCR.  She has 1 at the FDS and FDP.  IMPRESSION:  Spasticity still not at preinjection baseline.  We discussed that likely we will wear off in the next month or two.  I will reevaluate her and 1 month and then decide whether we need to reinject after that.     Erick Colace, M.D. Electronically Signed    AEK/MedQ D:  04/11/2011 12:44:16  T:  04/11/2011 21:22:10  Job #:  [id number]

## 2011-05-09 ENCOUNTER — Encounter: Payer: Medicare Other | Attending: Physical Medicine & Rehabilitation

## 2011-05-09 ENCOUNTER — Ambulatory Visit (HOSPITAL_BASED_OUTPATIENT_CLINIC_OR_DEPARTMENT_OTHER): Payer: Medicare Other | Admitting: Physical Medicine & Rehabilitation

## 2011-05-09 DIAGNOSIS — G8110 Spastic hemiplegia affecting unspecified side: Secondary | ICD-10-CM

## 2011-05-09 DIAGNOSIS — I69959 Hemiplegia and hemiparesis following unspecified cerebrovascular disease affecting unspecified side: Secondary | ICD-10-CM | POA: Insufficient documentation

## 2011-05-09 NOTE — Assessment & Plan Note (Signed)
REASON FOR VISIT:  Increasing tightness in the right hand.  HISTORY:  A 77 year old female with pontine infarct causing right spastic hemiplegia stroke onset February 24, 2009, has been through inpatient therapy and outpatient therapy now.  Had a second round of outpatient therapy after she had some decline in her function.  We did repeat a Botox injection right forearm.  This has been helpful in alleviating spasticity right hand and wrist area.  She was unable to open her hand prior to the injection, now was able to open her hand and close her hand.  She has had no new medical problems in the interval time period.  She has been doing some increased activity.  She has been trying to walk without her AFO, however, still has a foot drop.  PHYSICAL EXAMINATION:  Foot drop with circumduction of the ankle, 0-5 ankle dorsiflexion.  She has 3+ at the hip flexor, 4- at the knee extensor.  Right upper extremity has Ashworth grade 2 at the finger flexors and 3 at the wrist flexors and 2 at the biceps.  IMPRESSION: 1. 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 2. Spasticity initially improved with Botox injection performed and     may starting to wear off back to her baseline in terms of wrist     flexors.  Still at a 2 for her forearm finger flexors.  PLAN: 1. We will repeat Botox injection 200 units to forearms. 2. Had a 100 units for her right biceps.  Discussed with the patient,     agrees with plan.     Erick Colace, M.D. Electronically Signed    AEK/MedQ D:  05/09/2011 16:30:03  T:  05/09/2011 23:04:04  Job #:  [id number]  cc:   Emeterio Reeve, MD Fax: [phone]

## 2011-05-26 LAB — URINALYSIS, ROUTINE W REFLEX MICROSCOPIC
Bilirubin Urine: NEGATIVE
Ketones, ur: NEGATIVE
Nitrite: NEGATIVE
Protein, ur: NEGATIVE
pH: 5

## 2011-05-26 LAB — URINE CULTURE
Colony Count: >=100000
Special Requests: NEGATIVE

## 2011-05-26 LAB — URINE MICROSCOPIC-ADD ON

## 2011-05-26 LAB — BASIC METABOLIC PANEL
CO2: 24
CO2: 20
CO2: 22
Calcium: 9
Calcium: 8.8
Calcium: 9.2
Calcium: 9.4
Chloride: 107
GFR calc Af Amer: 43 — ABNORMAL LOW
GFR calc Af Amer: 41 — ABNORMAL LOW
GFR calc Af Amer: 47 — ABNORMAL LOW
GFR calc non Af Amer: 35 — ABNORMAL LOW
GFR calc non Af Amer: 39 — ABNORMAL LOW
Glucose, Bld: 100 — ABNORMAL HIGH
Potassium: 4.7
Potassium: 4.4
Sodium: 141
Sodium: 137
Sodium: 137
Sodium: 137

## 2011-05-26 LAB — COMPREHENSIVE METABOLIC PANEL
ALT: <8
AST: 13
Albumin: 3.3 — ABNORMAL LOW
CO2: 22
Calcium: 9
Creatinine, Ser: 1.21 — ABNORMAL HIGH
GFR calc Af Amer: 53 — ABNORMAL LOW
GFR calc non Af Amer: 44 — ABNORMAL LOW
Sodium: 139
Total Protein: 6.2

## 2011-05-26 LAB — CBC
HCT: 42.3
HCT: 39.4
Hemoglobin: 13.8
Hemoglobin: 12.8
Hemoglobin: 13.1
Hemoglobin: 14
MCHC: 32.6
MCHC: 32.6
MCHC: 33.6
RBC: 5.12 — ABNORMAL HIGH
RBC: 4.77
RBC: 5.13 — ABNORMAL HIGH
RDW: 15.1
RDW: 15.2
WBC: 9.4

## 2011-05-26 LAB — POCT CARDIAC MARKERS
CKMB, poc: 1.2
Myoglobin, poc: 72.7
Troponin i, poc: <0.05

## 2011-05-26 LAB — POCT I-STAT, CHEM 8
BUN: 22
Calcium, Ion: 1.19
Chloride: 108
HCT: 43
Potassium: 3.3 — ABNORMAL LOW

## 2011-05-26 LAB — PROTEIN ELECTROPH W RFLX QUANT IMMUNOGLOBULINS
Gamma Globulin: 16.6
M-Spike, %: NOT DETECTED
Total Protein ELP: 6.6

## 2011-05-26 LAB — DIFFERENTIAL
Eosinophils Relative: 3
Monocytes Absolute: 0.5
Monocytes Relative: 6

## 2011-05-26 LAB — T3: T3, Total: 129.7 (ref 80.0–204.0)

## 2011-06-02 LAB — BASIC METABOLIC PANEL
BUN: 24 mg/dL — ABNORMAL HIGH (ref 6–23)
CO2: 25 mEq/L (ref 19–32)
Calcium: 7.4 mg/dL — ABNORMAL LOW (ref 8.4–10.5)
Calcium: 7.8 mg/dL — ABNORMAL LOW (ref 8.4–10.5)
Calcium: 8.1 mg/dL — ABNORMAL LOW (ref 8.4–10.5)
Calcium: 9.2 mg/dL (ref 8.4–10.5)
Creatinine, Ser: 1.46 mg/dL — ABNORMAL HIGH (ref 0.4–1.2)
Creatinine, Ser: 1.75 mg/dL — ABNORMAL HIGH (ref 0.4–1.2)
Creatinine, Ser: 1.87 mg/dL — ABNORMAL HIGH (ref 0.4–1.2)
GFR calc Af Amer: 31 mL/min — ABNORMAL LOW (ref >60)
GFR calc Af Amer: 32 mL/min — ABNORMAL LOW (ref >60)
GFR calc Af Amer: 35 mL/min — ABNORMAL LOW (ref >60)
GFR calc non Af Amer: 26 mL/min — ABNORMAL LOW (ref >60)
GFR calc non Af Amer: 29 mL/min — ABNORMAL LOW (ref >60)
GFR calc non Af Amer: 35 mL/min — ABNORMAL LOW (ref >60)
Glucose, Bld: 133 mg/dL — ABNORMAL HIGH (ref 70–99)
Glucose, Bld: 95 mg/dL (ref 70–99)
Sodium: 136 mEq/L (ref 135–145)
Sodium: 139 mEq/L (ref 135–145)
Sodium: 141 mEq/L (ref 135–145)

## 2011-06-02 LAB — CBC
MCHC: 33 g/dL (ref 30.0–36.0)
MCHC: 34.2 g/dL (ref 30.0–36.0)
MCV: 86.2 fL (ref 78.0–100.0)
Platelets: 71 10*3/uL — ABNORMAL LOW (ref 150–400)
Platelets: 90 10*3/uL — ABNORMAL LOW (ref 150–400)
Platelets: 93 10*3/uL — ABNORMAL LOW (ref 150–400)
RBC: 3.65 MIL/uL — ABNORMAL LOW (ref 3.87–5.11)
RBC: 3.72 MIL/uL — ABNORMAL LOW (ref 3.87–5.11)
RDW: 15 % (ref 11.5–15.5)
RDW: 15.2 % (ref 11.5–15.5)
RDW: 15.3 % (ref 11.5–15.5)

## 2011-06-02 LAB — TYPE AND SCREEN
ABO/RH(D): A POS
Antibody Screen: NEGATIVE

## 2011-06-02 LAB — CREATININE, FLUID (PLEURAL, PERITONEAL, JP DRAINAGE): Creat, Fluid: 2 mg/dL

## 2012-01-01 ENCOUNTER — Emergency Department (HOSPITAL_COMMUNITY)
Admission: EM | Admit: 2012-01-01 | Discharge: 2012-01-01 | Disposition: A | Discharge status: Home / Self Care | Payer: Medicare Other | Attending: Emergency Medicine | Admitting: Emergency Medicine

## 2012-01-01 ENCOUNTER — Emergency Department (HOSPITAL_COMMUNITY): Payer: Medicare Other

## 2012-01-01 DIAGNOSIS — R5381 Other malaise: Secondary | ICD-10-CM | POA: Insufficient documentation

## 2012-01-01 DIAGNOSIS — I4949 Other premature depolarization: Secondary | ICD-10-CM | POA: Insufficient documentation

## 2012-01-01 DIAGNOSIS — R110 Nausea: Secondary | ICD-10-CM

## 2012-01-01 DIAGNOSIS — D320 Benign neoplasm of cerebral meninges: Secondary | ICD-10-CM | POA: Insufficient documentation

## 2012-01-01 DIAGNOSIS — R112 Nausea with vomiting, unspecified: Secondary | ICD-10-CM | POA: Insufficient documentation

## 2012-01-01 DIAGNOSIS — D329 Benign neoplasm of meninges, unspecified: Secondary | ICD-10-CM

## 2012-01-01 DIAGNOSIS — I10 Essential (primary) hypertension: Secondary | ICD-10-CM | POA: Insufficient documentation

## 2012-01-01 LAB — CBC
Platelets: 102 10*3/uL — ABNORMAL LOW (ref 150–400)
RDW: 14.6 % (ref 11.5–15.5)
WBC: 10 10*3/uL (ref 4.0–10.5)

## 2012-01-01 LAB — TROPONIN I: Troponin I: <0.3 ng/mL (ref <0.30)

## 2012-01-01 LAB — URINALYSIS, ROUTINE W REFLEX MICROSCOPIC
Glucose, UA: NEGATIVE mg/dL
Hgb urine dipstick: NEGATIVE
Protein, ur: NEGATIVE mg/dL
pH: 7 (ref 5.0–8.0)

## 2012-01-01 LAB — URINE MICROSCOPIC-ADD ON

## 2012-01-01 LAB — COMPREHENSIVE METABOLIC PANEL
ALT: 13 U/L (ref 0–35)
AST: 14 U/L (ref 0–37)
Albumin: 3.6 g/dL (ref 3.5–5.2)
CO2: 22 mEq/L (ref 19–32)
Calcium: 9.4 mg/dL (ref 8.4–10.5)
Chloride: 105 mEq/L (ref 96–112)
GFR calc non Af Amer: 39 mL/min — ABNORMAL LOW (ref >90)
Sodium: 139 mEq/L (ref 135–145)
Total Bilirubin: 0.3 mg/dL (ref 0.3–1.2)

## 2012-01-01 LAB — DIFFERENTIAL
Basophils Absolute: 0.1 10*3/uL (ref 0.0–0.1)
Basophils Relative: 1 % (ref 0–1)
Lymphocytes Relative: 25 % (ref 12–46)
Neutro Abs: 6.6 10*3/uL (ref 1.7–7.7)

## 2012-01-01 MED ORDER — ONDANSETRON HCL 4 MG/2ML IJ SOLN
4.0000 mg | Freq: Once | INTRAMUSCULAR | Status: AC
  Administered 2012-01-01: 4 mg via INTRAVENOUS
  Filled 2012-01-01: qty 2

## 2012-01-01 MED ORDER — ONDANSETRON 4 MG PO TBDP: 4.0000 mg | ORAL_TABLET | Freq: Three times a day (TID) | ORAL | Status: AC | PRN

## 2012-01-01 MED ORDER — FAMOTIDINE 20 MG PO TABS: 20.0000 mg | ORAL_TABLET | Freq: Two times a day (BID) | ORAL | Status: DC

## 2012-01-01 MED ORDER — SODIUM CHLORIDE 0.9 % IV SOLN
Freq: Once | INTRAVENOUS | Status: AC
  Administered 2012-01-01: 18:00:00 via INTRAVENOUS

## 2012-01-01 NOTE — ED Provider Notes (Signed)
History     CSN: [id number]  Arrival date & time 01/01/12  1638   First MD Initiated Contact with Patient 01/01/12 1641      Chief Complaint  Patient presents with  . Fatigue  . Nausea    (Consider location/radiation/quality/duration/timing/severity/associated sxs/prior treatment) HPI  78yo cki, tension, toxic multinodular goiter, thrombocytopenia, CVA with residual right kidney paresis, right parietal angioma borderline diabetes presents with nausea. The patient states that she is felt nauseated for the past 8 days. She states it's been constant. The last time she vomited was 2 days ago. She's had no vomiting since then. She states that she's been drinking fluids but decrease of solid food secondary to her nausea. She denies fevers, chills. She denies headache, dizziness, chest pain, shortness of breath, abdominal pain. She denies back pain. Denies hematuria/dysuria/freq/urgency. He states that she has baseline right-sided weakness and walks with a cane and is no weaker than her baseline. She was seen by her primary care doctor this morning referred to the emergency department by EMS for further workup and evaluation. Her CBC was 73 prior to arrival per EMS. She was given 4 mg of IV Zofran prior to arrival  ED Notes, ED Provider Notes from 01/01/12 0000 to 01/01/12 16:43:34       Rogers Blocker, RN 01/01/2012 16:42      Per EMS- pt lives at home but was sent from PCP office. Pt has had N/V and weakness for approx 8 days. Denies SOB, diarrhea and pain. Previous stroke with left side impairment. BP 170/92. HR 60. With frequent PVC. CBG 73. O2 100%. Pt received 4mg  of zofran IV.    No past medical history on file.  No past surgical history on file.  No family history on file.  History  Substance Use Topics  . Smoking status: Not on file  . Smokeless tobacco: Not on file  . Alcohol Use: Not on file    OB History    No data available     Review of Systems  All other systems reviewed  and are negative.  except as noted HPI   Allergies  Cardura; Clonidine derivatives; and Prinzide  Home Medications   Current Outpatient Rx  Name Route Sig Dispense Refill  . ALPRAZOLAM 0.5 MG PO TABS Oral Take 0.5-1 mg by mouth at bedtime as needed. For anxiety/sleep.    Marland Kitchen AMLODIPINE BESYLATE 10 MG PO TABS Oral Take 10 mg by mouth daily.    . ASPIRIN 325 MG PO TABS Oral Take 325 mg by mouth daily.    . ATORVASTATIN CALCIUM 20 MG PO TABS Oral Take 20 mg by mouth at bedtime.    Marland Kitchen CITALOPRAM HYDROBROMIDE 10 MG PO TABS Oral Take 10 mg by mouth daily.    Marland Kitchen CLONIDINE HCL 0.1 MG/24HR TD PTWK Transdermal Place 1 patch onto the skin once a week. Applies on Fridays.    Di Kindle SULFATE 325 (65 FE) MG PO TABS Oral Take 325 mg by mouth daily with breakfast.    . HYDRALAZINE HCL 50 MG PO TABS Oral Take 50 mg by mouth 4 (four) times daily.    Marland Kitchen LISINOPRIL 40 MG PO TABS Oral Take 40 mg by mouth daily.    Marland Kitchen NORTRIPTYLINE HCL 25 MG PO CAPS Oral Take 25 mg by mouth 2 (two) times daily.    Marland Kitchen PANTOPRAZOLE SODIUM 40 MG PO TBEC Oral Take 40 mg by mouth daily as needed. For indigestion.    Gwyndolyn Kaufman  8.6 MG PO TABS Oral Take 1 tablet by mouth daily as needed. For constipation.    Marland Kitchen FAMOTIDINE 20 MG PO TABS Oral Take 1 tablet (20 mg total) by mouth 2 (two) times daily. 30 tablet 0  . ONDANSETRON 4 MG PO TBDP Oral Take 1 tablet (4 mg total) by mouth every 8 (eight) hours as needed for nausea. 20 tablet 0    BP 183/72  Pulse 70  Temp(Src) 98.5 F (36.9 C) (Oral)  Resp 15  SpO2 95%  Physical Exam  Nursing note and vitals reviewed. Constitutional: She is oriented to person, place, and time. She appears well-developed.  HENT:  Head: Atraumatic.  Mouth/Throat: Oropharynx is clear and moist.  Eyes: Conjunctivae and EOM are normal. Pupils are equal, round, and reactive to light.  Neck: Normal range of motion. Neck supple.  Cardiovascular: Normal rate, regular rhythm, normal heart sounds and intact distal  pulses.   Pulmonary/Chest: Effort normal and breath sounds normal. No respiratory distress. She has no wheezes. She has no rales.  Abdominal: Soft. She exhibits no distension. There is no tenderness. There is no rebound and no guarding.  Musculoskeletal: Normal range of motion.  Neurological: She is alert and oriented to person, place, and time. No cranial nerve deficit. She exhibits normal muscle tone. Coordination normal.       RUE and RLE 4+/5 weakness  Skin: Skin is warm and dry. No rash noted.  Psychiatric: She has a normal mood and affect.    Date: 01/01/2012  Rate: 69  Rhythm: normal sinus rhythm  QRS Axis: normal  Intervals: qtc 454  ST/T Wave abnormalities: normal  Conduction Disutrbances:none  Narrative Interpretation: PVC, probably left atrial abnormality  Old EKG Reviewed: changes noted  ED Course  Procedures (including critical care time)  Labs Reviewed  CBC - Abnormal; Notable for the following:    Platelets 102 (*) PLATELET COUNT CONFIRMED BY SMEAR   All other components within normal limits  COMPREHENSIVE METABOLIC PANEL - Abnormal; Notable for the following:    BUN 29 (*)    Creatinine, Ser 1.29 (*)    GFR calc non Af Amer 39 (*)    GFR calc Af Amer 45 (*)    All other components within normal limits  URINALYSIS, ROUTINE W REFLEX MICROSCOPIC - Abnormal; Notable for the following:    Ketones, ur 15 (*)    Leukocytes, UA TRACE (*)    All other components within normal limits  DIFFERENTIAL  LIPASE, BLOOD  TROPONIN I  URINE MICROSCOPIC-ADD ON   Dg Chest 2 View  01/01/2012  *RADIOLOGY REPORT*  Clinical Data: Weakness, nausea  CHEST - 2 VIEW  Comparison: 03/29/2011  Findings: Normal heart size and vascularity.  Negative for acute pneumonia, edema, collapse, consolidation, effusion or pneumothorax.  Trachea midline.  Atherosclerosis of the aorta and degenerative changes of the spine.  IMPRESSION: Stable chest exam.  No superimposed acute process  Original Report  Authenticated By: Judie Petit. Ruel Favors, M.D.   Ct Head Wo Contrast  01/01/2012  *RADIOLOGY REPORT*  Clinical Data: Weakness.  Nausea.  History of stroke.  CT HEAD WITHOUT CONTRAST  Technique:  Contiguous axial images were obtained from the base of the skull through the vertex without contrast.  Comparison: 02/25/2009.  Findings: Low attenuation is present in the left pons compatible with previously seen infarct.  Intracranial atherosclerosis. Posterior fossa structures appear within normal limits.  There is no mass lesion, mass effect, midline shift, hydrocephalus, or hemorrhage.  Hyperostosis frontalis  interna.  Tiny basal ganglia lacunar infarcts are present.  Chronic ischemic white matter disease is present.  Right temporal parietal calcified meningioma appears unchanged compared to prior.  This measures about 16 mm. Bilateral thalamic low attenuation lacunar infarcts were evident on the prior MRI.  IMPRESSION:  1.  Scattered lacunar infarcts, chronic ischemic white matter disease and old left pontine infarct noted.  No definite acute intracranial abnormality. 2.  Unchanged 16 mm right temporal parietal meningioma.  Original Report Authenticated By: Andreas Newport, M.D.    1. Nausea   2. Benign meningioma   3. Hypertension     MDM  Nausea and vomiting x 8 days, last vomited 2 days ago. Vitals signs stable. Her blood pressure is elevated. EKG without ischemic changes. Troponin negative. She is without chest pain and her nausea has been constant x 8 days. I have low susp for ACS. CT head with unchanged 16mm right temporal parietal meningioma. The family is aware of this. It is unlikely that this 78 year old have aggressive measures for this. At this time there is no mass effect and is essentially unchanged from her last imaging. She is feeling better with Zofran and IV fluid hydration. She is tolerating by mouth without difficulty. She will be discharged home with her son with whom she lives. She will follow  with her primary care Dr. Geronimo Running been given restricted precautions for return.         Forbes Cellar, MD 01/01/12 2126

## 2012-01-01 NOTE — Discharge Instructions (Signed)
Nausea and Vomiting Nausea is a sick feeling that often comes before throwing up (vomiting). Vomiting is a reflex where stomach contents come out of your mouth. Vomiting can cause severe loss of body fluids (dehydration). Children and elderly adults can become dehydrated quickly, especially if they also have diarrhea. Nausea and vomiting are symptoms of a condition or disease. It is important to find the cause of your symptoms. CAUSES   Direct irritation of the stomach lining. This irritation can result from increased acid production (gastroesophageal reflux disease), infection, food poisoning, taking certain medicines (such as nonsteroidal anti-inflammatory drugs), alcohol use, or tobacco use.   Signals from the brain.These signals could be caused by a headache, heat exposure, an inner ear disturbance, increased pressure in the brain from injury, infection, a tumor, or a concussion, pain, emotional stimulus, or metabolic problems.   An obstruction in the gastrointestinal tract (bowel obstruction).   Illnesses such as diabetes, hepatitis, gallbladder problems, appendicitis, kidney problems, cancer, sepsis, atypical symptoms of a heart attack, or eating disorders.   Medical treatments such as chemotherapy and radiation.   Receiving medicine that makes you sleep (general anesthetic) during surgery.  DIAGNOSIS Your caregiver may ask for tests to be done if the problems do not improve after a few days. Tests may also be done if symptoms are severe or if the reason for the nausea and vomiting is not clear. Tests may include:  Urine tests.   Blood tests.   Stool tests.   Cultures (to look for evidence of infection).   X-rays or other imaging studies.  Test results can help your caregiver make decisions about treatment or the need for additional tests. TREATMENT You need to stay well hydrated. Drink frequently but in small amounts.You may wish to drink water, sports drinks, clear broth, or  eat frozen ice pops or gelatin dessert to help stay hydrated.When you eat, eating slowly may help prevent nausea.There are also some antinausea medicines that may help prevent nausea. HOME CARE INSTRUCTIONS   Take all medicine as directed by your caregiver.   If you do not have an appetite, do not force yourself to eat. However, you must continue to drink fluids.   If you have an appetite, eat a normal diet unless your caregiver tells you differently.   Eat a variety of complex carbohydrates (rice, wheat, potatoes, bread), lean meats, yogurt, fruits, and vegetables.   Avoid high-fat foods because they are more difficult to digest.   Drink enough water and fluids to keep your urine clear or pale yellow.   If you are dehydrated, ask your caregiver for specific rehydration instructions. Signs of dehydration may include:   Severe thirst.   Dry lips and mouth.   Dizziness.   Dark urine.   Decreasing urine frequency and amount.   Confusion.   Rapid breathing or pulse.  SEEK IMMEDIATE MEDICAL CARE IF:   You have blood or brown flecks (like coffee grounds) in your vomit.   You have black or bloody stools.   You have a severe headache or stiff neck.   You are confused.   You have severe abdominal pain.   You have chest pain or trouble breathing.   You do not urinate at least once every 8 hours.   You develop cold or clammy skin.   You continue to vomit for longer than 24 to 48 hours.   You have a fever.  MAKE SURE YOU:   Understand these instructions.   Will watch your  condition.   Will get help right away if you are not doing well or get worse.  Document Released: 08/14/2005 Document Revised: 08/03/2011 Document Reviewed: 01/11/2011 [facility] Patient Information 2012 [place], [place].  RESOURCE GUIDE  Dental Problems  Patients with Medicaid: [facility] [phone] [address]                                            [phone] [address] Phone:  [phone]                                                   Phone:  [phone]  If unable to pay or uninsured, contact:  [facility] or [facility]. to become qualified for the adult dental clinic.  Chronic Pain Problems Contact [facility]  [phone] Patients need to be referred by their primary care doctor.  Insufficient Money for Medicine Contact [employer] Way:  call "211" or [facility] [phone])[phone].  No Primary Care Doctor Call [facility]  [phone] Other agencies that provide inexpensive medical care    [facility]  [phone]    [facility]  [phone]    [facility]  [phone]    [facility]  [phone]    [facility]  [phone]    [facility]  [phone]  Psychological Services [facility]  [phone] [facility]  [phone] [facility]   [phone] (emergency services [phone])  Abuse/Neglect [facility] Child Abuse Hotline 220-120-1389 [facility] Child Abuse Hotline 731-031-3488 (After Hours)  Emergency Shelter [facility] [phone]  Maternity Homes [facility] [place] [facility] [phone] [facility] [phone]  MRSA Hotline #:   212 885 1213    [facility]  [facility] [place]  [employer]                           [facility] Dept. [address] [place]                     [address]         [address]  Blondell Reveal Phone:  [phone]                                  Phone:  [phone]                   Phone:  [phone]  [facility] Phone:  [phone]  [facility] Child Abuse Hotline 6600157213 6675371555 (After Hours)

## 2012-01-01 NOTE — ED Notes (Signed)
Per EMS- pt lives at home but was sent from PCP office. Pt has had N/V and weakness for approx 8 days. Denies SOB, diarrhea and pain. Previous stroke with left side impairment. BP 170/92. HR 60. With frequent PVC. CBG 73. O2 100%. Pt received 4mg  of zofran IV.

## 2012-01-01 NOTE — ED Notes (Signed)
DR. Hyman Hopes at bedside

## 2012-04-08 ENCOUNTER — Other Ambulatory Visit (HOSPITAL_COMMUNITY): Payer: Self-pay | Admitting: Urology

## 2012-04-08 DIAGNOSIS — C649 Malignant neoplasm of unspecified kidney, except renal pelvis: Secondary | ICD-10-CM

## 2012-05-23 ENCOUNTER — Ambulatory Visit (HOSPITAL_COMMUNITY)
Admission: RE | Admit: 2012-05-23 | Discharge: 2012-05-23 | Disposition: A | Discharge status: Home / Self Care | Payer: Medicare Other | Source: Ambulatory Visit | Attending: Urology | Admitting: Urology

## 2012-05-23 DIAGNOSIS — C649 Malignant neoplasm of unspecified kidney, except renal pelvis: Secondary | ICD-10-CM

## 2012-05-23 DIAGNOSIS — N289 Disorder of kidney and ureter, unspecified: Secondary | ICD-10-CM | POA: Insufficient documentation

## 2012-06-07 ENCOUNTER — Other Ambulatory Visit (HOSPITAL_COMMUNITY): Payer: Self-pay | Admitting: Urology

## 2012-06-07 ENCOUNTER — Ambulatory Visit (HOSPITAL_COMMUNITY)
Admission: RE | Admit: 2012-06-07 | Discharge: 2012-06-07 | Disposition: A | Discharge status: Home / Self Care | Payer: Medicare Other | Source: Ambulatory Visit | Attending: Urology | Admitting: Urology

## 2012-06-07 DIAGNOSIS — C649 Malignant neoplasm of unspecified kidney, except renal pelvis: Secondary | ICD-10-CM | POA: Insufficient documentation

## 2013-05-20 ENCOUNTER — Other Ambulatory Visit (HOSPITAL_COMMUNITY): Payer: Self-pay | Admitting: Urology

## 2013-05-20 DIAGNOSIS — C649 Malignant neoplasm of unspecified kidney, except renal pelvis: Secondary | ICD-10-CM

## 2013-06-05 ENCOUNTER — Ambulatory Visit (HOSPITAL_COMMUNITY)
Admission: RE | Admit: 2013-06-05 | Discharge: 2013-06-05 | Disposition: A | Discharge status: Home / Self Care | Payer: Medicare Other | Source: Ambulatory Visit | Attending: Urology | Admitting: Urology

## 2013-06-05 DIAGNOSIS — C649 Malignant neoplasm of unspecified kidney, except renal pelvis: Secondary | ICD-10-CM | POA: Insufficient documentation

## 2013-06-05 DIAGNOSIS — K449 Diaphragmatic hernia without obstruction or gangrene: Secondary | ICD-10-CM | POA: Insufficient documentation

## 2013-06-05 DIAGNOSIS — N289 Disorder of kidney and ureter, unspecified: Secondary | ICD-10-CM | POA: Insufficient documentation

## 2013-06-05 DIAGNOSIS — K7689 Other specified diseases of liver: Secondary | ICD-10-CM | POA: Insufficient documentation

## 2013-06-10 ENCOUNTER — Other Ambulatory Visit (HOSPITAL_COMMUNITY): Payer: Self-pay | Admitting: Urology

## 2013-06-10 ENCOUNTER — Ambulatory Visit (HOSPITAL_COMMUNITY)
Admission: RE | Admit: 2013-06-10 | Discharge: 2013-06-10 | Disposition: A | Discharge status: Home / Self Care | Payer: Medicare Other | Source: Ambulatory Visit | Attending: Urology | Admitting: Urology

## 2013-06-10 DIAGNOSIS — C649 Malignant neoplasm of unspecified kidney, except renal pelvis: Secondary | ICD-10-CM

## 2013-06-10 DIAGNOSIS — I10 Essential (primary) hypertension: Secondary | ICD-10-CM | POA: Insufficient documentation

## 2013-07-04 ENCOUNTER — Other Ambulatory Visit (HOSPITAL_COMMUNITY): Payer: Self-pay | Admitting: Family Medicine

## 2013-07-04 DIAGNOSIS — Z1231 Encounter for screening mammogram for malignant neoplasm of breast: Secondary | ICD-10-CM

## 2013-07-23 ENCOUNTER — Ambulatory Visit (HOSPITAL_COMMUNITY)
Admission: RE | Admit: 2013-07-23 | Discharge: 2013-07-23 | Disposition: A | Discharge status: Home / Self Care | Payer: Medicare Other | Source: Ambulatory Visit | Attending: Family Medicine | Admitting: Family Medicine

## 2013-07-23 DIAGNOSIS — Z1231 Encounter for screening mammogram for malignant neoplasm of breast: Secondary | ICD-10-CM | POA: Insufficient documentation

## 2014-05-15 ENCOUNTER — Other Ambulatory Visit (HOSPITAL_COMMUNITY): Payer: Self-pay | Admitting: Urology

## 2014-05-15 DIAGNOSIS — C649 Malignant neoplasm of unspecified kidney, except renal pelvis: Secondary | ICD-10-CM

## 2014-06-05 ENCOUNTER — Ambulatory Visit (HOSPITAL_COMMUNITY)
Admission: RE | Admit: 2014-06-05 | Discharge: 2014-06-05 | Disposition: A | Discharge status: Home / Self Care | Payer: Medicare Other | Source: Ambulatory Visit | Attending: Urology | Admitting: Urology

## 2014-06-05 DIAGNOSIS — C649 Malignant neoplasm of unspecified kidney, except renal pelvis: Secondary | ICD-10-CM | POA: Diagnosis present

## 2014-07-21 ENCOUNTER — Other Ambulatory Visit (HOSPITAL_COMMUNITY): Payer: Self-pay | Admitting: Family Medicine

## 2014-07-21 DIAGNOSIS — Z1231 Encounter for screening mammogram for malignant neoplasm of breast: Secondary | ICD-10-CM

## 2014-07-24 ENCOUNTER — Ambulatory Visit (HOSPITAL_COMMUNITY)
Admission: RE | Admit: 2014-07-24 | Discharge: 2014-07-24 | Disposition: A | Discharge status: Home / Self Care | Payer: Medicare Other | Source: Ambulatory Visit | Attending: Family Medicine | Admitting: Family Medicine

## 2014-07-24 DIAGNOSIS — Z1231 Encounter for screening mammogram for malignant neoplasm of breast: Secondary | ICD-10-CM | POA: Diagnosis not present

## 2015-05-17 ENCOUNTER — Other Ambulatory Visit (HOSPITAL_COMMUNITY): Payer: Self-pay | Admitting: Urology

## 2015-05-17 DIAGNOSIS — C649 Malignant neoplasm of unspecified kidney, except renal pelvis: Secondary | ICD-10-CM

## 2015-06-09 ENCOUNTER — Ambulatory Visit (HOSPITAL_COMMUNITY)
Admission: RE | Admit: 2015-06-09 | Discharge: 2015-06-09 | Disposition: A | Discharge status: Home / Self Care | Payer: Medicare Other | Source: Ambulatory Visit | Attending: Urology | Admitting: Urology

## 2015-06-09 DIAGNOSIS — N189 Chronic kidney disease, unspecified: Secondary | ICD-10-CM | POA: Insufficient documentation

## 2015-06-09 DIAGNOSIS — Z905 Acquired absence of kidney: Secondary | ICD-10-CM | POA: Insufficient documentation

## 2015-06-09 DIAGNOSIS — K449 Diaphragmatic hernia without obstruction or gangrene: Secondary | ICD-10-CM | POA: Diagnosis not present

## 2015-06-09 DIAGNOSIS — C649 Malignant neoplasm of unspecified kidney, except renal pelvis: Secondary | ICD-10-CM

## 2015-06-16 ENCOUNTER — Other Ambulatory Visit (HOSPITAL_COMMUNITY): Payer: Self-pay | Admitting: Urology

## 2015-06-16 ENCOUNTER — Ambulatory Visit (HOSPITAL_COMMUNITY)
Admission: RE | Admit: 2015-06-16 | Discharge: 2015-06-16 | Disposition: A | Discharge status: Home / Self Care | Payer: Medicare Other | Source: Ambulatory Visit | Attending: Urology | Admitting: Urology

## 2015-06-16 DIAGNOSIS — D49519 Neoplasm of unspecified behavior of unspecified kidney: Secondary | ICD-10-CM | POA: Diagnosis present

## 2015-06-16 DIAGNOSIS — R918 Other nonspecific abnormal finding of lung field: Secondary | ICD-10-CM | POA: Insufficient documentation

## 2015-06-16 DIAGNOSIS — I517 Cardiomegaly: Secondary | ICD-10-CM | POA: Insufficient documentation

## 2016-06-01 ENCOUNTER — Other Ambulatory Visit (HOSPITAL_COMMUNITY): Payer: Self-pay | Admitting: Urology

## 2016-06-01 DIAGNOSIS — D49512 Neoplasm of unspecified behavior of left kidney: Secondary | ICD-10-CM

## 2016-06-14 ENCOUNTER — Ambulatory Visit (HOSPITAL_COMMUNITY)
Admission: RE | Admit: 2016-06-14 | Discharge: 2016-06-14 | Disposition: A | Discharge status: Home / Self Care | Payer: Medicare Other | Source: Ambulatory Visit | Attending: Urology | Admitting: Urology

## 2016-06-14 DIAGNOSIS — D49512 Neoplasm of unspecified behavior of left kidney: Secondary | ICD-10-CM | POA: Insufficient documentation

## 2016-06-14 DIAGNOSIS — Z905 Acquired absence of kidney: Secondary | ICD-10-CM | POA: Insufficient documentation

## 2016-11-02 ENCOUNTER — Emergency Department (HOSPITAL_COMMUNITY): Payer: Medicare Other

## 2016-11-02 ENCOUNTER — Encounter (HOSPITAL_COMMUNITY): Payer: Self-pay | Admitting: *Deleted

## 2016-11-02 ENCOUNTER — Inpatient Hospital Stay (HOSPITAL_COMMUNITY)
Admission: EM | Admit: 2016-11-02 | Discharge: 2016-11-06 | DRG: 481 | Disposition: A | Discharge status: Skilled Nursing Facility | Payer: Medicare Other | Attending: Orthopedic Surgery | Admitting: Orthopedic Surgery

## 2016-11-02 DIAGNOSIS — D696 Thrombocytopenia, unspecified: Secondary | ICD-10-CM | POA: Diagnosis present

## 2016-11-02 DIAGNOSIS — Z8673 Personal history of transient ischemic attack (TIA), and cerebral infarction without residual deficits: Secondary | ICD-10-CM

## 2016-11-02 DIAGNOSIS — W19XXXA Unspecified fall, initial encounter: Secondary | ICD-10-CM | POA: Diagnosis present

## 2016-11-02 DIAGNOSIS — F329 Major depressive disorder, single episode, unspecified: Secondary | ICD-10-CM | POA: Diagnosis present

## 2016-11-02 DIAGNOSIS — F419 Anxiety disorder, unspecified: Secondary | ICD-10-CM | POA: Diagnosis present

## 2016-11-02 DIAGNOSIS — Z90710 Acquired absence of both cervix and uterus: Secondary | ICD-10-CM

## 2016-11-02 DIAGNOSIS — Z8249 Family history of ischemic heart disease and other diseases of the circulatory system: Secondary | ICD-10-CM | POA: Diagnosis not present

## 2016-11-02 DIAGNOSIS — T783XXA Angioneurotic edema, initial encounter: Secondary | ICD-10-CM | POA: Diagnosis present

## 2016-11-02 DIAGNOSIS — E079 Disorder of thyroid, unspecified: Secondary | ICD-10-CM | POA: Diagnosis present

## 2016-11-02 DIAGNOSIS — D649 Anemia, unspecified: Secondary | ICD-10-CM

## 2016-11-02 DIAGNOSIS — S72001A Fracture of unspecified part of neck of right femur, initial encounter for closed fracture: Secondary | ICD-10-CM | POA: Diagnosis not present

## 2016-11-02 DIAGNOSIS — N179 Acute kidney failure, unspecified: Secondary | ICD-10-CM | POA: Diagnosis present

## 2016-11-02 DIAGNOSIS — N183 Chronic kidney disease, stage 3 unspecified: Secondary | ICD-10-CM

## 2016-11-02 DIAGNOSIS — Z888 Allergy status to other drugs, medicaments and biological substances status: Secondary | ICD-10-CM

## 2016-11-02 DIAGNOSIS — Y92002 Bathroom of unspecified non-institutional (private) residence single-family (private) house as the place of occurrence of the external cause: Secondary | ICD-10-CM

## 2016-11-02 DIAGNOSIS — I69351 Hemiplegia and hemiparesis following cerebral infarction affecting right dominant side: Secondary | ICD-10-CM | POA: Diagnosis not present

## 2016-11-02 DIAGNOSIS — D72829 Elevated white blood cell count, unspecified: Secondary | ICD-10-CM | POA: Diagnosis not present

## 2016-11-02 DIAGNOSIS — Z419 Encounter for procedure for purposes other than remedying health state, unspecified: Secondary | ICD-10-CM

## 2016-11-02 DIAGNOSIS — S72009A Fracture of unspecified part of neck of unspecified femur, initial encounter for closed fracture: Secondary | ICD-10-CM | POA: Diagnosis present

## 2016-11-02 DIAGNOSIS — R112 Nausea with vomiting, unspecified: Secondary | ICD-10-CM | POA: Diagnosis present

## 2016-11-02 DIAGNOSIS — I129 Hypertensive chronic kidney disease with stage 1 through stage 4 chronic kidney disease, or unspecified chronic kidney disease: Secondary | ICD-10-CM | POA: Diagnosis present

## 2016-11-02 DIAGNOSIS — I10 Essential (primary) hypertension: Secondary | ICD-10-CM

## 2016-11-02 DIAGNOSIS — W1830XA Fall on same level, unspecified, initial encounter: Secondary | ICD-10-CM | POA: Diagnosis present

## 2016-11-02 DIAGNOSIS — Z87891 Personal history of nicotine dependence: Secondary | ICD-10-CM | POA: Diagnosis not present

## 2016-11-02 DIAGNOSIS — R9431 Abnormal electrocardiogram [ECG] [EKG]: Secondary | ICD-10-CM | POA: Diagnosis not present

## 2016-11-02 DIAGNOSIS — S72144A Nondisplaced intertrochanteric fracture of right femur, initial encounter for closed fracture: Principal | ICD-10-CM | POA: Diagnosis present

## 2016-11-02 DIAGNOSIS — Z7982 Long term (current) use of aspirin: Secondary | ICD-10-CM | POA: Diagnosis not present

## 2016-11-02 DIAGNOSIS — E872 Acidosis: Secondary | ICD-10-CM | POA: Diagnosis present

## 2016-11-02 DIAGNOSIS — Z79899 Other long term (current) drug therapy: Secondary | ICD-10-CM

## 2016-11-02 DIAGNOSIS — Z9889 Other specified postprocedural states: Secondary | ICD-10-CM

## 2016-11-02 DIAGNOSIS — R531 Weakness: Secondary | ICD-10-CM | POA: Diagnosis not present

## 2016-11-02 HISTORY — DX: Cerebral infarction, unspecified: I63.9

## 2016-11-02 HISTORY — DX: Essential (primary) hypertension: I10

## 2016-11-02 HISTORY — DX: Angioneurotic edema, initial encounter: T78.3XXA

## 2016-11-02 HISTORY — DX: Disorder of thyroid, unspecified: E07.9

## 2016-11-02 LAB — PROTIME-INR
INR: 1.07
Prothrombin Time: 13.9 seconds (ref 11.4–15.2)

## 2016-11-02 LAB — BASIC METABOLIC PANEL
Anion gap: 10 (ref 5–15)
BUN: 48 mg/dL — ABNORMAL HIGH (ref 6–20)
CO2: 20 mmol/L — ABNORMAL LOW (ref 22–32)
Calcium: 8.7 mg/dL — ABNORMAL LOW (ref 8.9–10.3)
Chloride: 109 mmol/L (ref 101–111)
Creatinine, Ser: 2.63 mg/dL — ABNORMAL HIGH (ref 0.44–1.00)
GFR calc Af Amer: 19 mL/min — ABNORMAL LOW (ref >60)
GFR calc non Af Amer: 16 mL/min — ABNORMAL LOW (ref >60)
Glucose, Bld: 145 mg/dL — ABNORMAL HIGH (ref 65–99)
Potassium: 4.9 mmol/L (ref 3.5–5.1)
Sodium: 139 mmol/L (ref 135–145)

## 2016-11-02 LAB — CBC WITH DIFFERENTIAL/PLATELET
Basophils Absolute: 0 10*3/uL (ref 0.0–0.1)
Basophils Relative: 0 %
Eosinophils Absolute: 0 10*3/uL (ref 0.0–0.7)
Eosinophils Relative: 0 %
HCT: 32.2 % — ABNORMAL LOW (ref 36.0–46.0)
Hemoglobin: 10.5 g/dL — ABNORMAL LOW (ref 12.0–15.0)
Lymphocytes Relative: 4 %
Lymphs Abs: 0.7 10*3/uL (ref 0.7–4.0)
MCH: 27.6 pg (ref 26.0–34.0)
MCHC: 32.6 g/dL (ref 30.0–36.0)
MCV: 84.5 fL (ref 78.0–100.0)
Monocytes Absolute: 0.3 10*3/uL (ref 0.1–1.0)
Monocytes Relative: 2 %
Neutro Abs: 15.3 10*3/uL — ABNORMAL HIGH (ref 1.7–7.7)
Neutrophils Relative %: 94 %
Platelets: 118 10*3/uL — ABNORMAL LOW (ref 150–400)
RBC: 3.81 MIL/uL — ABNORMAL LOW (ref 3.87–5.11)
RDW: 14.7 % (ref 11.5–15.5)
WBC: 16.3 10*3/uL — ABNORMAL HIGH (ref 4.0–10.5)

## 2016-11-02 LAB — APTT: APTT: 23 s — AB (ref 24–36)

## 2016-11-02 MED ORDER — CITALOPRAM HYDROBROMIDE 10 MG PO TABS
10.0000 mg | ORAL_TABLET | Freq: Every day | ORAL | Status: DC
  Administered 2016-11-03 – 2016-11-06 (×4): 10 mg via ORAL
  Filled 2016-11-02 (×4): qty 1

## 2016-11-02 MED ORDER — POVIDONE-IODINE 10 % EX SWAB: 2.0000 "application " | Freq: Once | CUTANEOUS | Status: DC

## 2016-11-02 MED ORDER — SODIUM CHLORIDE 0.9 % IV SOLN
INTRAVENOUS | Status: DC
  Administered 2016-11-02 – 2016-11-03 (×2): via INTRAVENOUS

## 2016-11-02 MED ORDER — SENNA 8.6 MG PO TABS
1.0000 | ORAL_TABLET | Freq: Two times a day (BID) | ORAL | Status: DC
  Administered 2016-11-03 – 2016-11-05 (×6): 8.6 mg via ORAL
  Filled 2016-11-02 (×7): qty 1

## 2016-11-02 MED ORDER — HYDROCODONE-ACETAMINOPHEN 5-325 MG PO TABS
1.0000 | ORAL_TABLET | Freq: Four times a day (QID) | ORAL | Status: DC | PRN
  Administered 2016-11-03: 13:00:00 1 via ORAL
  Filled 2016-11-02: qty 1

## 2016-11-02 MED ORDER — SODIUM CHLORIDE 0.9 % IV BOLUS (SEPSIS): 500.0000 mL | Freq: Once | INTRAVENOUS | Status: DC

## 2016-11-02 MED ORDER — ATORVASTATIN CALCIUM 20 MG PO TABS
20.0000 mg | ORAL_TABLET | Freq: Every day | ORAL | Status: DC
  Administered 2016-11-03 – 2016-11-05 (×4): 20 mg via ORAL
  Filled 2016-11-02 (×4): qty 1

## 2016-11-02 MED ORDER — CHLORHEXIDINE GLUCONATE 4 % EX LIQD: 60.0000 mL | Freq: Once | CUTANEOUS | Status: DC

## 2016-11-02 MED ORDER — PANTOPRAZOLE SODIUM 20 MG PO TBEC
20.0000 mg | DELAYED_RELEASE_TABLET | Freq: Every day | ORAL | Status: DC
  Administered 2016-11-03 – 2016-11-06 (×4): 20 mg via ORAL
  Filled 2016-11-02 (×4): qty 1

## 2016-11-02 MED ORDER — KETOROLAC TROMETHAMINE 15 MG/ML IJ SOLN: 15.0000 mg | Freq: Once | INTRAMUSCULAR | Status: DC

## 2016-11-02 MED ORDER — HYDRALAZINE HCL 20 MG/ML IJ SOLN
20.0000 mg | Freq: Four times a day (QID) | INTRAMUSCULAR | Status: DC | PRN
  Administered 2016-11-03: 07:00:00 20 mg via INTRAVENOUS
  Filled 2016-11-02 (×2): qty 1

## 2016-11-02 MED ORDER — HYDRALAZINE HCL 50 MG PO TABS
50.0000 mg | ORAL_TABLET | Freq: Four times a day (QID) | ORAL | Status: DC
  Administered 2016-11-03 – 2016-11-05 (×8): 50 mg via ORAL
  Filled 2016-11-02 (×8): qty 1

## 2016-11-02 MED ORDER — AMLODIPINE BESYLATE 10 MG PO TABS
10.0000 mg | ORAL_TABLET | Freq: Every day | ORAL | Status: DC
  Administered 2016-11-03: 10:00:00 10 mg via ORAL
  Filled 2016-11-02: qty 2

## 2016-11-02 MED ORDER — ALPRAZOLAM 0.5 MG PO TABS: 0.5000 mg | ORAL_TABLET | Freq: Every evening | ORAL | Status: DC | PRN

## 2016-11-02 MED ORDER — NORTRIPTYLINE HCL 25 MG PO CAPS: 25.0000 mg | ORAL_CAPSULE | Freq: Every day | ORAL | Status: DC

## 2016-11-02 MED ORDER — ONDANSETRON HCL 4 MG/2ML IJ SOLN
4.0000 mg | Freq: Once | INTRAMUSCULAR | Status: AC
  Administered 2016-11-02: 4 mg via INTRAVENOUS
  Filled 2016-11-02: qty 2

## 2016-11-02 MED ORDER — FERROUS SULFATE 325 (65 FE) MG PO TABS
325.0000 mg | ORAL_TABLET | Freq: Every day | ORAL | Status: DC
  Administered 2016-11-04: 325 mg via ORAL
  Filled 2016-11-02 (×2): qty 1

## 2016-11-02 MED ORDER — HYDROMORPHONE HCL 1 MG/ML IJ SOLN
0.5000 mg | INTRAMUSCULAR | Status: DC | PRN
  Administered 2016-11-03 (×4): 0.5 mg via INTRAVENOUS
  Filled 2016-11-02 (×4): qty 0.5

## 2016-11-02 MED ORDER — LORAZEPAM 2 MG/ML IJ SOLN
0.5000 mg | Freq: Once | INTRAMUSCULAR | Status: AC
  Administered 2016-11-03: 0.5 mg via INTRAVENOUS
  Filled 2016-11-02: qty 1

## 2016-11-02 MED ORDER — ASPIRIN 325 MG PO TABS: 325.0000 mg | ORAL_TABLET | Freq: Every day | ORAL | Status: DC

## 2016-11-02 MED ORDER — HYDROMORPHONE HCL 1 MG/ML IJ SOLN
1.0000 mg | Freq: Once | INTRAMUSCULAR | Status: AC
  Administered 2016-11-02: 1 mg via INTRAVENOUS
  Filled 2016-11-02: qty 1

## 2016-11-02 MED ORDER — NORTRIPTYLINE HCL 25 MG PO CAPS
25.0000 mg | ORAL_CAPSULE | Freq: Every day | ORAL | Status: DC
  Administered 2016-11-04 – 2016-11-05 (×2): 25 mg via ORAL
  Filled 2016-11-02 (×4): qty 1

## 2016-11-02 MED ORDER — MORPHINE SULFATE (PF) 4 MG/ML IV SOLN: 6.0000 mg | Freq: Once | INTRAVENOUS | Status: DC

## 2016-11-02 MED ORDER — NORTRIPTYLINE HCL 25 MG PO CAPS: 25.0000 mg | ORAL_CAPSULE | Freq: Two times a day (BID) | ORAL | Status: DC

## 2016-11-02 MED ORDER — CEFAZOLIN SODIUM-DEXTROSE 2-4 GM/100ML-% IV SOLN
2.0000 g | INTRAVENOUS | Status: AC
  Administered 2016-11-03: 2 g via INTRAVENOUS

## 2016-11-02 NOTE — ED Notes (Signed)
Pt attempted to use fracture pan for urine sample but was unsuccessful.

## 2016-11-02 NOTE — ED Provider Notes (Signed)
[place] DEPT Provider Note   By signing my name below, I, Bea Graff, attest that this documentation has been prepared under the direction and in the presence of Virgel Manifold, MD. Electronically Signed: Bea Graff, ED Scribe. 11/02/16. 8:25 PM.    History   Chief Complaint Chief Complaint  Patient presents with  . Hip Pain   The history is provided by the patient and medical records. No language interpreter was used.    Cassie Peters is a 83 y.o. female brought in by EMS, who presents to the Emergency Department complaining of a mechanical fall that occurred approximately 7 hours ago. She reports associated right thigh and hip pain with nausea. Pt states her right hip gave out causing her to fall. She states she did hit her head but not hard and denies pain. She was not able to stand on her own and bear weight after the fall. She has not taken anything for pain relief. Touching the area and moving the RLE increases he pain. She denies alleviating factors. She denies LOC. She states she is on anticoagulants but is unsure the name.   No past medical history on file.  There are no active problems to display for this patient.   No past surgical history on file.  OB History    No data available       Home Medications    Prior to Admission medications   Medication Sig Start Date End Date Taking? Authorizing Provider  ALPRAZolam Duanne Moron) 0.5 MG tablet Take 0.5-1 mg by mouth at bedtime as needed. For anxiety/sleep.    Historical Provider, MD  amLODipine (NORVASC) 10 MG tablet Take 10 mg by mouth daily.    Historical Provider, MD  aspirin 325 MG tablet Take 325 mg by mouth daily.    Historical Provider, MD  atorvastatin (LIPITOR) 20 MG tablet Take 20 mg by mouth at bedtime.    Historical Provider, MD  citalopram (CELEXA) 10 MG tablet Take 10 mg by mouth daily.    Historical Provider, MD  cloNIDine (CATAPRES - DOSED IN MG/24 HR) 0.1 mg/24hr patch Place 1 patch onto  the skin once a week. Applies on Fridays.    Historical Provider, MD  famotidine (PEPCID) 20 MG tablet Take 1 tablet (20 mg total) by mouth 2 (two) times daily. 01/01/12 12/31/12  Blair Heys, MD  ferrous sulfate 325 (65 FE) MG tablet Take 325 mg by mouth daily with breakfast.    Historical Provider, MD  hydrALAZINE (APRESOLINE) 50 MG tablet Take 50 mg by mouth 4 (four) times daily.    Historical Provider, MD  lisinopril (PRINIVIL,ZESTRIL) 40 MG tablet Take 40 mg by mouth daily.    Historical Provider, MD  nortriptyline (PAMELOR) 25 MG capsule Take 25 mg by mouth 2 (two) times daily.    Historical Provider, MD  pantoprazole (PROTONIX) 40 MG tablet Take 40 mg by mouth daily as needed. For indigestion.    Historical Provider, MD  senna (SENOKOT) 8.6 MG TABS Take 1 tablet by mouth daily as needed. For constipation.    Historical Provider, MD    Family History No family history on file.  Social History Social History  Substance Use Topics  . Smoking status: Not on file  . Smokeless tobacco: Not on file  . Alcohol use Not on file     Allergies   Cardura [doxazosin mesylate]; Clonidine derivatives; and Prinzide [lisinopril-hydrochlorothiazide]   Review of Systems Review of Systems A complete 10 system review of systems  was obtained and all systems are negative except as noted in the HPI and PMH.    Physical Exam Updated Vital Signs BP 169/77 (BP Location: Left Arm)   Pulse 75   Temp 98.1 F (36.7 C) (Oral)   Resp 16   SpO2 97%   Physical Exam  Constitutional: She is oriented to person, place, and time. She appears well-developed and well-nourished. No distress.  HENT:  Head: Normocephalic and atraumatic.  Eyes: EOM are normal.  Neck: Normal range of motion.  Cardiovascular: Normal rate, regular rhythm and normal heart sounds.   Pulmonary/Chest: Effort normal and breath sounds normal.  Abdominal: Soft. She exhibits no distension. There is no tenderness.  Musculoskeletal: She  exhibits tenderness.  Laying with both hips flexed to 45 degrees. ROM severely limited by pain. Foot warm. Palpable DP pulse. Sensation intact to light touch.  Neurological: She is alert and oriented to person, place, and time.  Skin: Skin is warm and dry.  Psychiatric: She has a normal mood and affect. Judgment normal.  Nursing note and vitals reviewed.    ED Treatments / Results  DIAGNOSTIC STUDIES: Oxygen Saturation is 97% on RA, normal by my interpretation.   COORDINATION OF CARE: 6:34 PM- Will order imaging, pain and nausea medication. Pt verbalizes understanding and agrees to plan.  Medications  0.9 %  sodium chloride infusion (not administered)  HYDROmorphone (DILAUDID) injection 1 mg (not administered)  ondansetron (ZOFRAN) injection 4 mg (not administered)    Labs (all labs ordered are listed, but only abnormal results are displayed) Labs Reviewed  CBC WITH DIFFERENTIAL/PLATELET  BASIC METABOLIC PANEL    EKG  EKG Interpretation None       Radiology Dg Chest 1 View  Result Date: 11/02/2016 CLINICAL DATA:  Preop for right hip fracture fixation. EXAM: CHEST 1 VIEW COMPARISON:  06/16/2015 FINDINGS: The heart is within normal limits in size and stable. There is moderate tortuosity and calcification of the thoracic aorta. The lungs are clear of acute process. No pleural effusion. The bony thorax is intact. IMPRESSION: No acute cardiopulmonary findings. Electronically Signed   By: Marijo Sanes M.D.   On: 11/02/2016 20:01   Dg Hip Unilat W Or Wo Pelvis 2-3 Views Right  Result Date: 11/02/2016 CLINICAL DATA:  [address] in bathroom today at 1100 hours. Subsequent RIGHT hip pain. EXAM: DG HIP (WITH OR WITHOUT PELVIS) 2-3V RIGHT COMPARISON:  None. FINDINGS: Acute RIGHT femur intertrochanteric fracture in alignment. Avulsion of the RIGHT lesser trochanter. No dislocation. No destructive bony lesions. Mild bilateral hip osteoarthrosis. Moderate degenerative change of the included  lumbar spine. Moderate vascular calcifications. Phleboliths project in the pelvis. IMPRESSION: Acute nondisplaced RIGHT femur intertrochanteric fracture. No dislocation. Electronically Signed   By: Elon Alas M.D.   On: 11/02/2016 19:19    Procedures Procedures (including critical care time)  Medications Ordered in ED Medications  0.9 %  sodium chloride infusion (not administered)  HYDROmorphone (DILAUDID) injection 1 mg (not administered)  ondansetron (ZOFRAN) injection 4 mg (not administered)     Initial Impression / Assessment and Plan / ED Course  I have reviewed the triage vital signs and the nursing notes.  Pertinent labs & imaging results that were available during my care of the patient were reviewed by me and considered in my medical decision making (see chart for details).     82yF with R hip pain. Intertrochanteric fx. Closed injury. NVI. Ortho consult. Admit.   I personally preformed the services scribed in my presence.  The recorded information has been reviewed is accurate. Virgel Manifold, MD.   Final Clinical Impressions(s) / ED Diagnoses   Final diagnoses:  Hip fracture [facility])  Surgery, elective  Closed fracture of right hip, initial encounter [facility])    New Prescriptions New Prescriptions   No medications on file     Virgel Manifold, MD 11/14/16 1350

## 2016-11-02 NOTE — Progress Notes (Addendum)
Consult request has been received. CSW following up at present time.   10:12 PM CSW consult completed.  Alphonse Guild. Coriann Brouhard, Latanya Presser, LCAS Clinical Social Worker Ph: [phone]

## 2016-11-02 NOTE — Progress Notes (Signed)
Consult request has been received. Full evaluation to follow in the am.  Patient has sustained a right basocervical intertrochanteric hip fracture that will be treated with intramedullary hip nailing tomorrow unless medical service wishes to pursue further interventions.  At this time surgery at [facility] in the afternoon has been scheduled but I have contacted my colleague, Dr. Adriana Mccallum, regarding possible earlier surgery at [facility] if available.  Altamese Myrtle, MD [facility] [phone] (p)

## 2016-11-02 NOTE — ED Notes (Signed)
Bed: WA19 Expected date:  Expected time:  Means of arrival:  Comments: EMS fall/hip pain 

## 2016-11-02 NOTE — Clinical Social Work Note (Signed)
Clinical Social Work Assessment  Patient Details  Name: Cassie Peters MRN: [id number] Date of Birth: 12/04/1933  Date of referral:  11/02/16               Reason for consult:  Facility Placement                Permission sought to share information with:  Chartered certified accountant granted to share information::  Yes, Verbal Permission Granted  Name::        Agency::     Relationship::     Contact Information:     Housing/Transportation Living arrangements for the past 2 months:  Single Family Home Source of Information:  Patient, Adult Children Patient Interpreter Needed:  None Criminal Activity/Legal Involvement Pertinent to Current Situation/Hospitalization:    Significant Relationships:  Adult Children Lives with:  Adult Children Do you feel safe going back to the place where you live?  Yes Need for family participation in patient care:  No (Coment)  Care giving concerns:  None listed by pt/family   Social Worker assessment / plan:  CSW met with pt and pt's son and confirmed pt's plan to be discharged to SNF to live at discharge, if home is not appropriate, per PT recommendation.  CSW provided active listening and validated pt's son's concerns.  Pt's son would prefer, if needed, pt's D/C to [facility] as close as possible to [employer] in [place] where the pt and pt's son lives.  Pt has been living independently prior to being admitted to [facility] with her adult son Jinny Blossom at ph: [phone].  Employment status:    Insurance information:  Advice worker) PT Recommendations:  Not assessed at this time Information / Referral to community resources:    Patient/Family's Response to care:  Patient alert and oriented but demonstrated being in extreme pain at time of assessment.  Patient's son agreeable to plan.  Pt's son supportive and strongly involved in pt.'s care.  Pt.'s son pleasant and appreciated CSW intervention.    Patient/Family's  Understanding of and Emotional Response to Diagnosis, Current Treatment, and Prognosis:  Still assessing   Emotional Assessment Appearance:  Appears younger than stated age Attitude/Demeanor/Rapport:    Affect (typically observed):  Unable to Assess (Pt presented as if she were in pain miday thorugh assessment, completed with pt's son) Orientation:  Oriented to Place, Oriented to Self, Oriented to  Time, Oriented to Situation Alcohol / Substance use:    Psych involvement (Current and /or in the community):     Discharge Needs  Concerns to be addressed:  No discharge needs identified Readmission within the last 30 days:  No Current discharge risk:  None Barriers to Discharge:  No Barriers Identified   Claudine Mouton, LCSWA 11/02/2016, 10:15 PM

## 2016-11-02 NOTE — H&P (Signed)
History and Physical    Cassie Peters:[id number] DOB: 12/04/1933 DOA: 11/02/2016  PCP: [name], MD   Patient coming from: Home  Chief Complaint: Fall, hip pain  HPI: Cassie Peters is a 83 y.o. woman with a history of CVA with residual right sided weakness (she ambulates with a cane at baseline), HTN, probable CKD 3 (available labs reviewed in our system; erratic trend, best creatinine was 1.29 in May 2013), thyroid disease, and recent episode of angioedema (possibly related to ACE-I; she did not come to medical attention) who presents to the ED via EMS for evaluation of acute hip pain after having a mechanical fall in her bathroom today.  She feels that she was in her baseline state of health before the falls.  She denies any preceding aura, headache, chest pain, shortness of breath, palpitations, or light-headedness.  She did not lose consciousness.  Her son was present in the home and her heard her go down.  He rushed to her attention immediately.  Patient was awake and alert, unable to get up of the ground, and complaining of pain.  No stigmata of seizure activity.  No recent antibiotics for any reason.  No unexplained chest pain, SOB, or lower extremity edema.  No lower urinary tract symptoms.  ED Course: Xray shows acute nondisplaced right femur intertrochanteric fracture.  No dislocation.  Chest xray is clear.  Several abnormal labs including leukocytosis of 16, Hgb 10.5, platelet count 118, bicarb 20, BUN 48, Creatinine 2.63.  Orthopedic surgery has been consulted (Dr. Marcelino Scot); hospitalist asked to admit.  Review of Systems: As per HPI otherwise 10 systems reviewed and are negative.   Past Medical History:  Diagnosis Date  . Angio-edema    one episode in 1st week of March 2018 - did not see dr.- son and pt state mouth and tongue swelled so bad she could not talk  . Hypertension   . Stroke [facility]) 2011   right arm and right leg effected  . Thyroid disorder     Past  Surgical History:  Procedure Laterality Date  . ABDOMINAL HYSTERECTOMY     "partial hysterectomy"  . right kidney surgery  approx 2010   "cyst removed from kidney"     reports that she quit smoking about 25 years ago. Her smoking use included Cigarettes. She quit after 20.00 years of use. She has never used smokeless tobacco. She reports that she does not drink alcohol or use drugs.  She is a widow. Ambulated with cane at baseline.  Allergies  Allergen Reactions  . Cardura [Doxazosin Mesylate]     Allergic to pill form  . Clonidine Derivatives     Allergic to pill form  . Prinzide [Lisinopril-Hydrochlorothiazide]     Allergic to pill form   FAMILY HISTORY: Parents are deceased.  Father had a history of CVA.  Mother had a history of HTN.  Prior to Admission medications   Medication Sig Start Date End Date Taking? Authorizing Provider  ALPRAZolam Duanne Moron) 0.5 MG tablet Take 0.5-1 mg by mouth at bedtime as needed. For anxiety/sleep.    Historical Provider, MD  amLODipine (NORVASC) 10 MG tablet Take 10 mg by mouth daily.    Historical Provider, MD  aspirin 325 MG tablet Take 325 mg by mouth daily.    Historical Provider, MD  atorvastatin (LIPITOR) 20 MG tablet Take 20 mg by mouth at bedtime.    Historical Provider, MD  citalopram (CELEXA) 10 MG tablet Take 10 mg by  mouth daily.    Historical Provider, MD  cloNIDine (CATAPRES - DOSED IN MG/24 HR) 0.1 mg/24hr patch Place 1 patch onto the skin once a week. Applies on Fridays.    Historical Provider, MD  famotidine (PEPCID) 20 MG tablet Take 1 tablet (20 mg total) by mouth 2 (two) times daily. 01/01/12 12/31/12  Blair Heys, MD  ferrous sulfate 325 (65 FE) MG tablet Take 325 mg by mouth daily with breakfast.    Historical Provider, MD  hydrALAZINE (APRESOLINE) 50 MG tablet Take 50 mg by mouth 4 (four) times daily.    Historical Provider, MD  lisinopril (PRINIVIL,ZESTRIL) 40 MG tablet Take 40 mg by mouth daily.    Historical Provider, MD    nortriptyline (PAMELOR) 25 MG capsule Take 25 mg by mouth 2 (two) times daily.    Historical Provider, MD  pantoprazole (PROTONIX) 40 MG tablet Take 40 mg by mouth daily as needed. For indigestion.    Historical Provider, MD  senna (SENOKOT) 8.6 MG TABS Take 1 tablet by mouth daily as needed. For constipation.    Historical Provider, MD  Home medications to be reconciled by pharmacy; then reviewed by me.  Physical Exam: Vitals:   11/02/16 1811 11/02/16 1816  BP:  169/77  Pulse:  75  Resp:  16  Temp:  98.1 F (36.7 C)  TempSrc:  Oral  SpO2: 94% 97%      Constitutional: NAD, in acute pain at her right hip, anxious but nontoxic appearing Vitals:   11/02/16 1811 11/02/16 1816  BP:  169/77  Pulse:  75  Resp:  16  Temp:  98.1 F (36.7 C)  TempSrc:  Oral  SpO2: 94% 97%   Eyes: PERRL, lids and conjunctivae normal ENMT: Mucous membranes are dry. Posterior pharynx clear of any exudate or lesions. She has upper and lower dentures. Neck: normal appearance, supple, no masses Respiratory: clear to auscultation listening anteriorly.  No wheezing.  Normal respiratory effort. No accessory muscle use.  Cardiovascular: Normal rate, regular rhythm, no murmurs / rubs / gallops. No extremity edema. 2+ pedal pulses. GI: abdomen is soft and compressible.  No distention.  No tenderness.  No masses palpated.  Bowel sounds are present. Musculoskeletal:  No joint deformity in upper and lower extremities. Reduced ROM on her right side.  No contractures. Normal muscle tone.  Skin: no rashes, warm and dry Neurologic: Right sided weakness at baseline.  Otherwise, no focal deficits. Psychiatric: Normal judgment and insight. Alert and oriented x 3. Mood is appropriate.    Labs on Admission: I have personally reviewed following labs and imaging studies  CBC:  Recent Labs Lab 11/02/16 2006  WBC 16.3*  NEUTROABS 15.3*  HGB 10.5*  HCT 32.2*  MCV 84.5  PLT 570*   Basic Metabolic Panel:  Recent  Labs Lab 11/02/16 2006  NA 139  K 4.9  CL 109  CO2 20*  GLUCOSE 145*  BUN 48*  CREATININE 2.63*  CALCIUM 8.7*   GFR: CrCl cannot be calculated (Unknown ideal weight.).  Urine analysis: Pending  Radiological Exams on Admission: Dg Chest 1 View  Result Date: 11/02/2016 CLINICAL DATA:  Preop for right hip fracture fixation. EXAM: CHEST 1 VIEW COMPARISON:  06/16/2015 FINDINGS: The heart is within normal limits in size and stable. There is moderate tortuosity and calcification of the thoracic aorta. The lungs are clear of acute process. No pleural effusion. The bony thorax is intact. IMPRESSION: No acute cardiopulmonary findings. Electronically Signed   By: Mamie Nick.  Gallerani M.D.   On: 11/02/2016 20:01   Dg Hip Unilat W Or Wo Pelvis 2-3 Views Right  Result Date: 11/02/2016 CLINICAL DATA:  [address] in bathroom today at 1100 hours. Subsequent RIGHT hip pain. EXAM: DG HIP (WITH OR WITHOUT PELVIS) 2-3V RIGHT COMPARISON:  None. FINDINGS: Acute RIGHT femur intertrochanteric fracture in alignment. Avulsion of the RIGHT lesser trochanter. No dislocation. No destructive bony lesions. Mild bilateral hip osteoarthrosis. Moderate degenerative change of the included lumbar spine. Moderate vascular calcifications. Phleboliths project in the pelvis. IMPRESSION: Acute nondisplaced RIGHT femur intertrochanteric fracture. No dislocation. Electronically Signed   By: Elon Alas M.D.   On: 11/02/2016 19:19    EKG: Pre-op EKG pending  Assessment/Plan Principal Problem:   Hip fracture (HCC) Active Problems:   CKD (chronic kidney disease) stage 3, GFR 30-59 ml/min   History of CVA (cerebrovascular accident)   Right sided weakness   Anemia   Thrombocytopenia (HCC)   Leukocytosis      Acute nondisplaced right intertrochanteric femur fracture.  Based on the Revised Cardiac Risk Index, she would be considered high risk for peri-operative cardiac complications due to her history of CVA and creatinine greater  than 2. However, she does not have any signs of symptoms of unstable angina or decompensated heart failure.  Consider cardiology consult in the AM; defer to surgeon. --Prompt orthopedic response greatly appreciated --Pre-op EKG added to initial work-up done in the ED --Analgesics as needed --SCDs for now --NPO after midnight in anticipation of OR in the AM --Ancillary orders per Hip Fracture order set  Accelerated HTN secondary to acute pain --Will review home medications when they are available.  Will avoid ACE-I, ARB due to renal insufficiency --IV hydralazine prn with parametes --Pain control  Probable CKD 3 but degree of AKI unclear; I suspect mild acidosis and anemia also go along with her renal disease --Gentle hydration now --Repeat renal studies in the AM --Daily weights and strict I/O --Urinalysis --Avoid nephrotoxic agents  Leukocytosis is likely acute phase reactant in the setting of acute injury.  No signs of infection at this time. --Follow trend  Prior CVA --Aspirin, statin  Anemia, suspect AOCD --Anemia panel --Type and screen for OR  Thrombocytopenia --Check B12, folate --Follow trend  GI prophylaxis --PPI     DVT prophylaxis: SCDs Code Status: FULL Family Communication: Son and sister present in the ED at time of admission.  Son is next of kin. Disposition Plan: To be determined. Consults called: Orthopedic surgery (Dr. Marcelino Scot) Admission status: Inpatient, med surg.  I expect this patient will need inpatient services for greater than two midnights.   TIME SPENT: 60 minutes   Eber Jones MD [facility] Pager [phone]  If 7PM-7AM, please contact night-coverage [email or web address] Password [facility]  11/02/2016, 9:31 PM

## 2016-11-02 NOTE — ED Triage Notes (Signed)
Pt reports falling in bathroom today at around 1100 stating that she "missed the toilet". Pt was assisted up by son and placed in chair where pt has been seated since fall. Pt reports pain in right hip since fall. Pt presents seated in bed with knees drawn to chest

## 2016-11-02 NOTE — Progress Notes (Signed)
EKG reviewed by me.  P waves are clearly identifiable, although there is some irregularity to the QRS intervals.  This is a sinus arrhythmia, but the patient is not in atrial fibrillation.  Nortriptyline dose reduced to once daily (half of prior dose) for now due to concern for side effect profile, particularly in the elderly.  PCP should consider weaning this medication if possible.

## 2016-11-03 ENCOUNTER — Inpatient Hospital Stay (HOSPITAL_COMMUNITY): Payer: Medicare Other

## 2016-11-03 ENCOUNTER — Encounter (HOSPITAL_COMMUNITY): Payer: Self-pay | Admitting: Surgery

## 2016-11-03 ENCOUNTER — Inpatient Hospital Stay (HOSPITAL_COMMUNITY): Payer: Medicare Other | Admitting: Certified Registered Nurse Anesthetist

## 2016-11-03 ENCOUNTER — Encounter (HOSPITAL_COMMUNITY)
Admission: EM | Disposition: A | Discharge status: Skilled Nursing Facility | Payer: Self-pay | Source: Home / Self Care | Attending: Orthopedic Surgery

## 2016-11-03 DIAGNOSIS — Z9889 Other specified postprocedural states: Secondary | ICD-10-CM

## 2016-11-03 HISTORY — PX: INTRAMEDULLARY (IM) NAIL INTERTROCHANTERIC: SHX5875

## 2016-11-03 LAB — COMPREHENSIVE METABOLIC PANEL
ALK PHOS: 73 U/L (ref 38–126)
ALT: 14 U/L (ref 14–54)
ANION GAP: 9 (ref 5–15)
AST: 20 U/L (ref 15–41)
Albumin: 3.8 g/dL (ref 3.5–5.0)
BILIRUBIN TOTAL: 0.5 mg/dL (ref 0.3–1.2)
BUN: 46 mg/dL — ABNORMAL HIGH (ref 6–20)
CALCIUM: 8.9 mg/dL (ref 8.9–10.3)
CO2: 20 mmol/L — AB (ref 22–32)
CREATININE: 2.65 mg/dL — AB (ref 0.44–1.00)
Chloride: 110 mmol/L (ref 101–111)
GFR calc non Af Amer: 16 mL/min — ABNORMAL LOW (ref >60)
GFR, EST AFRICAN AMERICAN: 18 mL/min — AB (ref >60)
Glucose, Bld: 122 mg/dL — ABNORMAL HIGH (ref 65–99)
Potassium: 4.8 mmol/L (ref 3.5–5.1)
SODIUM: 139 mmol/L (ref 135–145)
TOTAL PROTEIN: 7 g/dL (ref 6.5–8.1)

## 2016-11-03 LAB — IRON AND TIBC
IRON: 14 ug/dL — AB (ref 28–170)
SATURATION RATIOS: 6 % — AB (ref 10.4–31.8)
TIBC: 230 ug/dL — AB (ref 250–450)
UIBC: 216 ug/dL

## 2016-11-03 LAB — CBC
HCT: 29.4 % — ABNORMAL LOW (ref 36.0–46.0)
HEMOGLOBIN: 9.1 g/dL — AB (ref 12.0–15.0)
MCH: 27.2 pg (ref 26.0–34.0)
MCHC: 31 g/dL (ref 30.0–36.0)
MCV: 88 fL (ref 78.0–100.0)
Platelets: 128 10*3/uL — ABNORMAL LOW (ref 150–400)
RBC: 3.34 MIL/uL — AB (ref 3.87–5.11)
RDW: 15.1 % (ref 11.5–15.5)
WBC: 19.2 10*3/uL — AB (ref 4.0–10.5)

## 2016-11-03 LAB — URINALYSIS, ROUTINE W REFLEX MICROSCOPIC
Bilirubin Urine: NEGATIVE
GLUCOSE, UA: NEGATIVE mg/dL
HGB URINE DIPSTICK: NEGATIVE
Ketones, ur: NEGATIVE mg/dL
Leukocytes, UA: NEGATIVE
NITRITE: NEGATIVE
PROTEIN: 100 mg/dL — AB
SPECIFIC GRAVITY, URINE: 1.012 (ref 1.005–1.030)
pH: 5 (ref 5.0–8.0)

## 2016-11-03 LAB — CBC WITH DIFFERENTIAL/PLATELET
Basophils Absolute: 0 10*3/uL (ref 0.0–0.1)
Basophils Relative: 0 %
Eosinophils Absolute: 0 10*3/uL (ref 0.0–0.7)
Eosinophils Relative: 0 %
HCT: 30.3 % — ABNORMAL LOW (ref 36.0–46.0)
Hemoglobin: 9.9 g/dL — ABNORMAL LOW (ref 12.0–15.0)
Lymphocytes Relative: 9 %
Lymphs Abs: 1.5 10*3/uL (ref 0.7–4.0)
MCH: 27.7 pg (ref 26.0–34.0)
MCHC: 32.7 g/dL (ref 30.0–36.0)
MCV: 84.9 fL (ref 78.0–100.0)
Monocytes Absolute: 1 10*3/uL (ref 0.1–1.0)
Monocytes Relative: 7 %
Neutro Abs: 13.4 10*3/uL — ABNORMAL HIGH (ref 1.7–7.7)
Neutrophils Relative %: 84 %
Platelets: 120 10*3/uL — ABNORMAL LOW (ref 150–400)
RBC: 3.57 MIL/uL — ABNORMAL LOW (ref 3.87–5.11)
RDW: 14.9 % (ref 11.5–15.5)
WBC: 16 10*3/uL — ABNORMAL HIGH (ref 4.0–10.5)

## 2016-11-03 LAB — RETICULOCYTES
RBC.: 3.52 MIL/uL — ABNORMAL LOW (ref 3.87–5.11)
RETIC CT PCT: 1.1 % (ref 0.4–3.1)
Retic Count, Absolute: 38.7 10*3/uL (ref 19.0–186.0)

## 2016-11-03 LAB — CREATININE, SERUM
Creatinine, Ser: 2.79 mg/dL — ABNORMAL HIGH (ref 0.44–1.00)
GFR calc non Af Amer: 15 mL/min — ABNORMAL LOW (ref >60)
GFR, EST AFRICAN AMERICAN: 17 mL/min — AB (ref >60)

## 2016-11-03 LAB — TSH: TSH: 0.075 u[IU]/mL — ABNORMAL LOW (ref 0.350–4.500)

## 2016-11-03 LAB — SURGICAL PCR SCREEN
MRSA, PCR: NEGATIVE
Staphylococcus aureus: NEGATIVE

## 2016-11-03 LAB — VITAMIN B12: VITAMIN B 12: 500 pg/mL (ref 180–914)

## 2016-11-03 LAB — TYPE AND SCREEN
ABO/RH(D): A POS
Antibody Screen: NEGATIVE

## 2016-11-03 LAB — FERRITIN: FERRITIN: 534 ng/mL — AB (ref 11–307)

## 2016-11-03 LAB — FOLATE: Folate: 8.8 ng/mL (ref >5.9)

## 2016-11-03 SURGERY — FIXATION, FRACTURE, INTERTROCHANTERIC, WITH INTRAMEDULLARY ROD
Anesthesia: General | Laterality: Right

## 2016-11-03 SURGERY — INSERTION, INTRAMEDULLARY ROD, FEMUR
Anesthesia: General | Laterality: Right

## 2016-11-03 MED ORDER — HYDROMORPHONE HCL 1 MG/ML IJ SOLN: 0.2500 mg | INTRAMUSCULAR | Status: DC | PRN

## 2016-11-03 MED ORDER — CHLORHEXIDINE GLUCONATE 4 % EX LIQD: 60.0000 mL | Freq: Once | CUTANEOUS | Status: DC

## 2016-11-03 MED ORDER — PROMETHAZINE HCL 25 MG/ML IJ SOLN: 6.2500 mg | INTRAMUSCULAR | Status: DC | PRN

## 2016-11-03 MED ORDER — SODIUM CHLORIDE 0.9 % IV SOLN
INTRAVENOUS | Status: DC
  Administered 2016-11-04: 01:00:00 via INTRAVENOUS

## 2016-11-03 MED ORDER — FENTANYL CITRATE (PF) 100 MCG/2ML IJ SOLN
INTRAMUSCULAR | Status: AC
  Filled 2016-11-03: qty 2

## 2016-11-03 MED ORDER — PROPOFOL 10 MG/ML IV BOLUS
INTRAVENOUS | Status: DC | PRN
  Administered 2016-11-03: 30 mg via INTRAVENOUS
  Administered 2016-11-03: 50 mg via INTRAVENOUS

## 2016-11-03 MED ORDER — ONDANSETRON HCL 4 MG/2ML IJ SOLN
INTRAMUSCULAR | Status: DC | PRN
  Administered 2016-11-03: 4 mg via INTRAVENOUS

## 2016-11-03 MED ORDER — METOCLOPRAMIDE HCL 5 MG PO TABS: 5.0000 mg | ORAL_TABLET | Freq: Three times a day (TID) | ORAL | Status: DC | PRN

## 2016-11-03 MED ORDER — METOCLOPRAMIDE HCL 5 MG/ML IJ SOLN: 5.0000 mg | Freq: Three times a day (TID) | INTRAMUSCULAR | Status: DC | PRN

## 2016-11-03 MED ORDER — HYDROMORPHONE HCL 2 MG/ML IJ SOLN
0.5000 mg | INTRAMUSCULAR | Status: DC | PRN
  Administered 2016-11-03: 0.5 mg via INTRAVENOUS
  Administered 2016-11-04: 1 mg via INTRAVENOUS
  Filled 2016-11-03 (×2): qty 1

## 2016-11-03 MED ORDER — CLONIDINE HCL 0.1 MG/24HR TD PTWK
0.1000 mg | MEDICATED_PATCH | TRANSDERMAL | Status: DC
  Administered 2016-11-03: 10:00:00 0.1 mg via TRANSDERMAL
  Filled 2016-11-03: qty 1

## 2016-11-03 MED ORDER — POLYETHYLENE GLYCOL 3350 17 G PO PACK
17.0000 g | PACK | Freq: Every day | ORAL | Status: DC | PRN
  Administered 2016-11-04: 17 g via ORAL
  Filled 2016-11-03: qty 1

## 2016-11-03 MED ORDER — CEFAZOLIN IN D5W 1 GM/50ML IV SOLN
1.0000 g | Freq: Four times a day (QID) | INTRAVENOUS | Status: AC
  Administered 2016-11-03 – 2016-11-04 (×2): 1 g via INTRAVENOUS
  Filled 2016-11-03 (×2): qty 50

## 2016-11-03 MED ORDER — PHENOL 1.4 % MT LIQD: 1.0000 | OROMUCOSAL | Status: DC | PRN

## 2016-11-03 MED ORDER — ENOXAPARIN SODIUM 30 MG/0.3ML ~~LOC~~ SOLN
30.0000 mg | SUBCUTANEOUS | Status: DC
  Administered 2016-11-04 – 2016-11-06 (×3): 30 mg via SUBCUTANEOUS
  Filled 2016-11-03 (×3): qty 0.3

## 2016-11-03 MED ORDER — METOCLOPRAMIDE HCL 5 MG/ML IJ SOLN
10.0000 mg | Freq: Four times a day (QID) | INTRAMUSCULAR | Status: DC | PRN
  Administered 2016-11-03: 10 mg via INTRAVENOUS
  Filled 2016-11-03: qty 2

## 2016-11-03 MED ORDER — ALUM & MAG HYDROXIDE-SIMETH 200-200-20 MG/5ML PO SUSP: 30.0000 mL | ORAL | Status: DC | PRN

## 2016-11-03 MED ORDER — SUGAMMADEX SODIUM 200 MG/2ML IV SOLN
INTRAVENOUS | Status: DC | PRN
Start: 2016-11-03 — End: 2016-11-04
  Administered 2016-11-03: 200 mg via INTRAVENOUS

## 2016-11-03 MED ORDER — DOCUSATE SODIUM 100 MG PO CAPS
100.0000 mg | ORAL_CAPSULE | Freq: Two times a day (BID) | ORAL | Status: DC
  Administered 2016-11-03 – 2016-11-05 (×5): 100 mg via ORAL
  Filled 2016-11-03 (×7): qty 1

## 2016-11-03 MED ORDER — ACETAMINOPHEN 650 MG RE SUPP: 650.0000 mg | Freq: Four times a day (QID) | RECTAL | Status: DC | PRN

## 2016-11-03 MED ORDER — FERROUS SULFATE 325 (65 FE) MG PO TABS
325.0000 mg | ORAL_TABLET | Freq: Two times a day (BID) | ORAL | Status: DC
  Administered 2016-11-04 – 2016-11-06 (×4): 325 mg via ORAL
  Filled 2016-11-03 (×3): qty 1

## 2016-11-03 MED ORDER — CEFAZOLIN SODIUM-DEXTROSE 2-4 GM/100ML-% IV SOLN: 2.0000 g | INTRAVENOUS | Status: DC

## 2016-11-03 MED ORDER — METHOCARBAMOL 500 MG PO TABS: 500.0000 mg | ORAL_TABLET | Freq: Four times a day (QID) | ORAL | Status: DC | PRN

## 2016-11-03 MED ORDER — HYDROCODONE-ACETAMINOPHEN 5-325 MG PO TABS
1.0000 | ORAL_TABLET | Freq: Four times a day (QID) | ORAL | Status: DC | PRN
  Administered 2016-11-04 (×2): 1 via ORAL
  Filled 2016-11-03 (×3): qty 1

## 2016-11-03 MED ORDER — FENTANYL CITRATE (PF) 100 MCG/2ML IJ SOLN
INTRAMUSCULAR | Status: AC
  Administered 2016-11-03: 50 ug via INTRAVENOUS
  Filled 2016-11-03: qty 2

## 2016-11-03 MED ORDER — ONDANSETRON HCL 4 MG PO TABS: 4.0000 mg | ORAL_TABLET | Freq: Four times a day (QID) | ORAL | Status: DC | PRN

## 2016-11-03 MED ORDER — ACETAMINOPHEN 10 MG/ML IV SOLN: 1000.0000 mg | Freq: Once | INTRAVENOUS | Status: DC

## 2016-11-03 MED ORDER — FENTANYL CITRATE (PF) 100 MCG/2ML IJ SOLN
25.0000 ug | INTRAMUSCULAR | Status: DC | PRN
  Administered 2016-11-03 (×2): 50 ug via INTRAVENOUS

## 2016-11-03 MED ORDER — METHOCARBAMOL 1000 MG/10ML IJ SOLN
500.0000 mg | Freq: Four times a day (QID) | INTRAVENOUS | Status: DC | PRN
  Filled 2016-11-03: qty 5

## 2016-11-03 MED ORDER — ONDANSETRON HCL 4 MG/2ML IJ SOLN
4.0000 mg | Freq: Four times a day (QID) | INTRAMUSCULAR | Status: DC | PRN
  Filled 2016-11-03: qty 2

## 2016-11-03 MED ORDER — FENTANYL CITRATE (PF) 100 MCG/2ML IJ SOLN
INTRAMUSCULAR | Status: DC | PRN
  Administered 2016-11-03: 75 ug via INTRAVENOUS

## 2016-11-03 MED ORDER — PROPOFOL 10 MG/ML IV BOLUS
INTRAVENOUS | Status: AC
  Filled 2016-11-03: qty 20

## 2016-11-03 MED ORDER — LACTATED RINGERS IV SOLN
INTRAVENOUS | Status: DC | PRN
  Administered 2016-11-03: 16:00:00 via INTRAVENOUS

## 2016-11-03 MED ORDER — ONDANSETRON HCL 4 MG/2ML IJ SOLN
4.0000 mg | Freq: Four times a day (QID) | INTRAMUSCULAR | Status: DC | PRN
  Administered 2016-11-04: 4 mg via INTRAVENOUS
  Filled 2016-11-03 (×2): qty 2

## 2016-11-03 MED ORDER — ACETAMINOPHEN 325 MG PO TABS: 650.0000 mg | ORAL_TABLET | Freq: Four times a day (QID) | ORAL | Status: DC | PRN

## 2016-11-03 MED ORDER — ENSURE ENLIVE PO LIQD
237.0000 mL | Freq: Two times a day (BID) | ORAL | Status: DC
  Administered 2016-11-04 – 2016-11-06 (×5): 237 mL via ORAL

## 2016-11-03 MED ORDER — DEXAMETHASONE SODIUM PHOSPHATE 10 MG/ML IJ SOLN: 10.0000 mg | Freq: Once | INTRAMUSCULAR | Status: DC

## 2016-11-03 MED ORDER — MENTHOL 3 MG MT LOZG: 1.0000 | LOZENGE | OROMUCOSAL | Status: DC | PRN

## 2016-11-03 MED ORDER — LIDOCAINE HCL (CARDIAC) 20 MG/ML IV SOLN
INTRAVENOUS | Status: DC | PRN
Start: 2016-11-03 — End: 2016-11-04
  Administered 2016-11-03: 60 mg via INTRAVENOUS

## 2016-11-03 MED ORDER — HYDRALAZINE HCL 20 MG/ML IJ SOLN
10.0000 mg | Freq: Four times a day (QID) | INTRAMUSCULAR | Status: DC | PRN
  Administered 2016-11-03 – 2016-11-04 (×2): 10 mg via INTRAVENOUS
  Filled 2016-11-03 (×2): qty 1

## 2016-11-03 MED ORDER — SODIUM CHLORIDE 0.9 % IR SOLN
Status: DC | PRN
  Administered 2016-11-03: 1000 mL

## 2016-11-03 MED ORDER — PROPOFOL 1000 MG/100ML IV EMUL
INTRAVENOUS | Status: AC
  Filled 2016-11-03: qty 100

## 2016-11-03 MED ORDER — ROCURONIUM BROMIDE 100 MG/10ML IV SOLN
INTRAVENOUS | Status: DC | PRN
  Administered 2016-11-03: 30 mg via INTRAVENOUS

## 2016-11-03 MED ORDER — POVIDONE-IODINE 10 % EX SWAB: 2.0000 "application " | Freq: Once | CUTANEOUS | Status: DC

## 2016-11-03 SURGICAL SUPPLY — 58 items
BIT DRILL CANN LG 4.3MM (BIT) IMPLANT
BNDG COHESIVE 6X5 TAN STRL LF (GAUZE/BANDAGES/DRESSINGS) IMPLANT
BRUSH SCRUB DISP (MISCELLANEOUS) ×2 IMPLANT
COVER PERINEAL POST (MISCELLANEOUS) ×3 IMPLANT
COVER SURGICAL LIGHT HANDLE (MISCELLANEOUS) ×4 IMPLANT
DRAPE C-ARMOR (DRAPES) IMPLANT
DRAPE ORTHO SPLIT 77X108 STRL (DRAPES)
DRAPE PROXIMA HALF (DRAPES) IMPLANT
DRAPE STERI IOBAN 125X83 (DRAPES) ×4 IMPLANT
DRAPE SURG 17X23 STRL (DRAPES) ×3 IMPLANT
DRAPE SURG ORHT 6 SPLT 77X108 (DRAPES) IMPLANT
DRAPE U-SHAPE 47X51 STRL (DRAPES) ×3 IMPLANT
DRILL BIT CANN LG 4.3MM (BIT) ×3
DRSG ADAPTIC 3X8 NADH LF (GAUZE/BANDAGES/DRESSINGS) ×3 IMPLANT
DRSG EMULSION OIL 3X3 NADH (GAUZE/BANDAGES/DRESSINGS) ×1 IMPLANT
DRSG MEPILEX BORDER 4X12 (GAUZE/BANDAGES/DRESSINGS) ×3 IMPLANT
DRSG MEPILEX BORDER 4X4 (GAUZE/BANDAGES/DRESSINGS) ×3 IMPLANT
DRSG MEPILEX BORDER 4X8 (GAUZE/BANDAGES/DRESSINGS) ×1 IMPLANT
DURAPREP 26ML APPLICATOR (WOUND CARE) ×3 IMPLANT
ELECT REM PT RETURN 9FT ADLT (ELECTROSURGICAL) ×3
ELECTRODE REM PT RTRN 9FT ADLT (ELECTROSURGICAL) ×2 IMPLANT
EVACUATOR 1/8 PVC DRAIN (DRAIN) IMPLANT
GAUZE SPONGE 4X4 12PLY STRL (GAUZE/BANDAGES/DRESSINGS) ×1 IMPLANT
GLOVE BIO SURGEON STRL SZ7.5 (GLOVE) ×3 IMPLANT
GLOVE BIO SURGEON STRL SZ8 (GLOVE) ×3 IMPLANT
GLOVE BIOGEL PI IND STRL 7.5 (GLOVE) ×2 IMPLANT
GLOVE BIOGEL PI IND STRL 8 (GLOVE) ×2 IMPLANT
GLOVE BIOGEL PI INDICATOR 7.5 (GLOVE) ×4
GLOVE BIOGEL PI INDICATOR 8 (GLOVE) ×2
GLOVE ORTHO TXT STRL SZ7.5 (GLOVE) ×3 IMPLANT
GLOVE SURG ORTHO 8.0 STRL STRW (GLOVE) ×3 IMPLANT
GOWN STRL REUS W/ TWL LRG LVL3 (GOWN DISPOSABLE) ×5 IMPLANT
GOWN STRL REUS W/ TWL XL LVL3 (GOWN DISPOSABLE) ×1 IMPLANT
GOWN STRL REUS W/TWL LRG LVL3 (GOWN DISPOSABLE) ×6
GOWN STRL REUS W/TWL XL LVL3 (GOWN DISPOSABLE)
GUIDEPIN VERSANAIL DSP 3.2X444 ×2 IMPLANT
KIT BASIN OR (CUSTOM PROCEDURE TRAY) ×4 IMPLANT
KIT ROOM TURNOVER OR (KITS) ×4 IMPLANT
LINER BOOT UNIVERSAL DISP (MISCELLANEOUS) ×4 IMPLANT
MANIFOLD NEPTUNE II (INSTRUMENTS) ×4 IMPLANT
NAIL HIP FRACT 130D 11X180 (Screw) ×2 IMPLANT
NS IRRIG 1000ML POUR BTL (IV SOLUTION) ×4 IMPLANT
PACK GENERAL/GYN (CUSTOM PROCEDURE TRAY) ×4 IMPLANT
PAD ARMBOARD 7.5X6 YLW CONV (MISCELLANEOUS) ×12 IMPLANT
SCREW BONE CORTICAL 5.0X36 (Screw) ×2 IMPLANT
SCREW LAG HIP NAIL 10.5X95 (Screw) ×2 IMPLANT
STAPLER VISISTAT 35W (STAPLE) ×4 IMPLANT
STOCKINETTE IMPERVIOUS LG (DRAPES) IMPLANT
SUT ETHILON 3 0 PS 1 (SUTURE) ×3 IMPLANT
SUT VIC AB 0 CT1 27 (SUTURE) ×3
SUT VIC AB 0 CT1 27XBRD ANBCTR (SUTURE) ×3 IMPLANT
SUT VIC AB 1 CT1 27 (SUTURE) ×3
SUT VIC AB 1 CT1 27XBRD ANBCTR (SUTURE) ×2 IMPLANT
SUT VIC AB 2-0 CT1 27 (SUTURE) ×3
SUT VIC AB 2-0 CT1 TAPERPNT 27 (SUTURE) ×2 IMPLANT
TOWEL OR 17X24 6PK STRL BLUE (TOWEL DISPOSABLE) ×2 IMPLANT
TOWEL OR 17X26 10 PK STRL BLUE (TOWEL DISPOSABLE) ×5 IMPLANT
WATER STERILE IRR 1000ML POUR (IV SOLUTION) ×2 IMPLANT

## 2016-11-03 NOTE — Transfer of Care (Signed)
Immediate Anesthesia Transfer of Care Note  Patient: Cassie Peters  Procedure(s) Performed: Procedure(s): INTRAMEDULLARY (IM) NAIL INTERTROCHANTRIC (Right)  Patient Location: PACU  Anesthesia Type:General  Level of Consciousness: awake  Airway & Oxygen Therapy: Patient Spontanous Breathing  Post-op Assessment: Report given to RN and Post -op Vital signs reviewed and stable  Post vital signs: Reviewed and stable  Last Vitals:  Vitals:   11/03/16 1444 11/03/16 1738  BP: (!) 184/62   Pulse: 91   Resp: 16   Temp: 37.3 C 36.6 C    Last Pain:  Vitals:   11/03/16 1738  TempSrc:   PainSc: Asleep      Patients Stated Pain Goal: 3 (81/85/63 1497)  Complications: No apparent anesthesia complications

## 2016-11-03 NOTE — Progress Notes (Signed)
PROGRESS NOTE  Cassie Peters  QIO:[id number] DOB: 12/04/1933 DOA: 11/02/2016 PCP: [name], MD  Brief Narrative:   Cassie Peters is a 83 y.o. woman with a history of CVA with residual right sided hemiparesis (she ambulates with a cane at baseline), HTN, probable CKD 3, thyroid disease, and recent episode of angioedema (possibly related to ACE-I; she did not come to medical attention) who presented with acute RIGHT hip pain after having a mechanical fall in her bathroom.  No associated chest pain, SOB, cold or UTI symptoms prior to her fall.  Xray demonstrated an acute nondisplaced right intertrochanteric fracture.  No dislocation.  Chest xray is clear.  UA negative.  She has been having nausea and vomiting since admission but no diarrhea.  Foley catheter placed for comfort and to OR this afternoon.  Assessment & Plan:   Principal Problem:   Hip fracture (HCC) Active Problems:   CKD (chronic kidney disease) stage 3, GFR 30-59 ml/min   History of CVA (cerebrovascular accident)   Right sided weakness   Anemia   Thrombocytopenia (HCC)   Leukocytosis   Nonspecific abnormal electrocardiogram (ECG) (EKG)  Acute nondisplaced right intertrochanteric femur fracture.   -  NPO with IVF -  Continue IV narcotics for pain pre-operatively -  To OR this afternoon -  DVT prophylaxis per orthopedic surgery  Accelerated HTN secondary to acute pain and nausea/vomiting --Avoid ACE-I, ARB due to renal insufficiency -  Continue pain management -- resume clonidine patch -  Reduce IV hydralazine prn   Nausea and vomiting, likely related to pain and pain medications.  Alternatively, could have a viral illness.   -  zofran and reglan prn  Probable CKD 3 but degree of AKI unclear; I suspect mild acidosis and anemia also go along with her renal disease.  --Gentle hydration now --Repeat renal studies in the AM -  UA negative --Avoid nephrotoxic agents  Leukocytosis. Possible she has stress  demargination and low grade fever due to fracture --repeat CBC in AM  Prior CVA --hold Aspirin prior to OR - continue statin  Anemia panel suggests AOCD --B12 and folate wnl   Thrombocytopenia, mild and asymptomatic and relatively unchanged from yesterday  GI prophylaxis --PPI  DVT prophylaxis:  SCDs Code Status:  full Family Communication:  Patient alone Disposition Plan:  Likely to SNF in 2-3 days   Consultants:   Orthopedic surgery, Dr. Alvan Dame  Procedures:  pending  Antimicrobials:  Anti-infectives    Start     Dose/Rate Route Frequency Ordered Stop   11/03/16 1700  ceFAZolin (ANCEF) IVPB 2g/100 mL premix     2 g 200 mL/hr over 30 Minutes Intravenous On call to O.R. 11/02/16 2123 11/04/16 0559   11/03/16 1000  ceFAZolin (ANCEF) IVPB 2g/100 mL premix  Status:  Discontinued     2 g 200 mL/hr over 30 Minutes Intravenous On call to O.R. 11/03/16 0950 11/03/16 1016       Subjective: Severe nausea and vomiting this morning requiring several doses of antiemetics.  Severe pain in the RLE with minimal movement.  States she feels terrible.  Denies chest pains, shortness of breath.  Unable to move right arm is chronic.  Inability of flex/ext of knee and foot on right also chronic due to previous stroke.    Objective: Vitals:   11/03/16 1140 11/03/16 1144 11/03/16 1325 11/03/16 1349  BP: (!) 205/56 (!) 200/49 (!) 194/58   Pulse:   91   Resp:  17  Temp:    99.9 F (37.7 C)  TempSrc:    Axillary  SpO2:    95%  Weight:  61.6 kg (135 lb 12.9 oz)    Height:        Intake/Output Summary (Last 24 hours) at 11/03/16 1438 Last data filed at 11/03/16 1430  Gross per 24 hour  Intake              725 ml  Output             1150 ml  Net             -425 ml   Filed Weights   11/02/16 2300 11/03/16 1144  Weight: 60.1 kg (132 lb 7.9 oz) 61.6 kg (135 lb 12.9 oz)    Examination:  General exam:  Adult female, in pain being moved in bed by RN and tech HEENT:  NCAT,  MMM Respiratory system: Clear to auscultation bilaterally Cardiovascular system: Regular rate and rhythm, normal S9/Q3. 3/6 systolic murmur  Warm extremities Gastrointestinal system: Normal active bowel sounds, soft, nondistended, nontender. MSK:  Normal tone and bulk in left extremities.  Foreshortening of the right lower extremity.  Contractures of the right upper extremity at the hand.  No lower extremity edema Neuro:  Almost complete right hemiplegia with facial droop except has enough trunkal tone and ability to move right hip slightly to walk with a cane.  Left upper and lower extremities 5/5.      Data Reviewed: I have personally reviewed following labs and imaging studies  CBC:  Recent Labs Lab 11/02/16 2006 11/03/16 0417  WBC 16.3* 16.0*  NEUTROABS 15.3* 13.4*  HGB 10.5* 9.9*  HCT 32.2* 30.3*  MCV 84.5 84.9  PLT 118* 300*   Basic Metabolic Panel:  Recent Labs Lab 11/02/16 2006 11/03/16 0417  NA 139 139  K 4.9 4.8  CL 109 110  CO2 20* 20*  GLUCOSE 145* 122*  BUN 48* 46*  CREATININE 2.63* 2.65*  CALCIUM 8.7* 8.9   GFR: Estimated Creatinine Clearance: 15.2 mL/min (by C-G formula based on SCr of 2.65 mg/dL (H)). Liver Function Tests:  Recent Labs Lab 11/03/16 0417  AST 20  ALT 14  ALKPHOS 73  BILITOT 0.5  PROT 7.0  ALBUMIN 3.8   No results for input(s): LIPASE, AMYLASE in the last 168 hours. No results for input(s): AMMONIA in the last 168 hours. Coagulation Profile:  Recent Labs Lab 11/02/16 2135  INR 1.07   Cardiac Enzymes: No results for input(s): CKTOTAL, CKMB, CKMBINDEX, TROPONINI in the last 168 hours. BNP (last 3 results) No results for input(s): PROBNP in the last 8760 hours. HbA1C: No results for input(s): HGBA1C in the last 72 hours. CBG: No results for input(s): GLUCAP in the last 168 hours. Lipid Profile: No results for input(s): CHOL, HDL, LDLCALC, TRIG, CHOLHDL, LDLDIRECT in the last 72 hours. Thyroid Function Tests:  Recent  Labs  11/02/16 2343  TSH 0.075*   Anemia Panel:  Recent Labs  11/02/16 2343  VITAMINB12 500  FOLATE 8.8  FERRITIN 534*  TIBC 230*  IRON 14*  RETICCTPCT 1.1   Urine analysis:    Component Value Date/Time   COLORURINE YELLOW 11/03/2016 0158   APPEARANCEUR CLEAR 11/03/2016 0158   LABSPEC 1.012 11/03/2016 0158   PHURINE 5.0 11/03/2016 0158   GLUCOSEU NEGATIVE 11/03/2016 0158   HGBUR NEGATIVE 11/03/2016 0158   BILIRUBINUR NEGATIVE 11/03/2016 0158   KETONESUR NEGATIVE 11/03/2016 0158   PROTEINUR 100 (A)  11/03/2016 0158   UROBILINOGEN 0.2 01/01/2012 1745   NITRITE NEGATIVE 11/03/2016 0158   LEUKOCYTESUR NEGATIVE 11/03/2016 0158   Sepsis Labs: @LABRCNTIP (procalcitonin:4,lacticidven:4)  ) Recent Results (from the past 240 hour(s))  Surgical PCR screen     Status: None   Collection Time: 11/03/16 12:09 AM  Result Value Ref Range Status   MRSA, PCR NEGATIVE NEGATIVE Final   Staphylococcus aureus NEGATIVE NEGATIVE Final    Comment:        The Xpert SA Assay (FDA approved for NASAL specimens in patients over 25 years of age), is one component of a comprehensive surveillance program.  Test performance has been validated by [facility] for patients greater than or equal to 34 year old. It is not intended to diagnose infection nor to guide or monitor treatment.       Radiology Studies: Dg Chest 1 View  Result Date: 11/02/2016 CLINICAL DATA:  Preop for right hip fracture fixation. EXAM: CHEST 1 VIEW COMPARISON:  06/16/2015 FINDINGS: The heart is within normal limits in size and stable. There is moderate tortuosity and calcification of the thoracic aorta. The lungs are clear of acute process. No pleural effusion. The bony thorax is intact. IMPRESSION: No acute cardiopulmonary findings. Electronically Signed   By: Marijo Sanes M.D.   On: 11/02/2016 20:01   Dg Knee Right Port  Result Date: 11/02/2016 CLINICAL DATA:  [place] in bathroom this morning. Known RIGHT hip fracture.  EXAM: PORTABLE RIGHT KNEE - 1-2 VIEW COMPARISON:  None. FINDINGS: No evidence of fracture, dislocation, or joint effusion. No evidence of arthropathy or other focal bone abnormality. Mild meniscal calcifications. Moderate vascular calcifications. IMPRESSION: No acute fracture deformity or dislocation. Electronically Signed   By: Elon Alas M.D.   On: 11/02/2016 21:50   Dg Hip Unilat W Or Wo Pelvis 2-3 Views Right  Result Date: 11/02/2016 CLINICAL DATA:  [place] in bathroom today at 1100 hours. Subsequent RIGHT hip pain. EXAM: DG HIP (WITH OR WITHOUT PELVIS) 2-3V RIGHT COMPARISON:  None. FINDINGS: Acute RIGHT femur intertrochanteric fracture in alignment. Avulsion of the RIGHT lesser trochanter. No dislocation. No destructive bony lesions. Mild bilateral hip osteoarthrosis. Moderate degenerative change of the included lumbar spine. Moderate vascular calcifications. Phleboliths project in the pelvis. IMPRESSION: Acute nondisplaced RIGHT femur intertrochanteric fracture. No dislocation. Electronically Signed   By: Elon Alas M.D.   On: 11/02/2016 19:19     Scheduled Meds: . acetaminophen  1,000 mg Intravenous Once  . amLODipine  10 mg Oral Daily  . atorvastatin  20 mg Oral QHS  .  ceFAZolin (ANCEF) IV  2 g Intravenous On Call to OR  . chlorhexidine  60 mL Topical Once  . citalopram  10 mg Oral Daily  . cloNIDine  0.1 mg Transdermal Weekly  . dexamethasone  10 mg Intravenous Once  . feeding supplement (ENSURE ENLIVE)  237 mL Oral BID BM  . ferrous sulfate  325 mg Oral Q breakfast  . hydrALAZINE  50 mg Oral QID  . LORazepam  0.5 mg Intravenous Once  . nortriptyline  25 mg Oral QHS  . pantoprazole  20 mg Oral Daily  . povidone-iodine  2 application Topical Once  . senna  1 tablet Oral BID  . sodium chloride  500 mL Intravenous Once   Continuous Infusions: . sodium chloride 75 mL/hr at 11/03/16 1321     LOS: 1 day    Time spent: 30 min    Janece Canterbury, MD [facility] Pager [phone]  If 7PM-7AM, please contact night-coverage [email or web address] Password [facility] 11/03/2016, 2:38 PM

## 2016-11-03 NOTE — Clinical Social Work Placement (Signed)
   CLINICAL SOCIAL WORK PLACEMENT  NOTE  Date:  11/03/2016  Patient Details  Name: Cassie Peters MRN: [id number] Date of Birth: 12/04/1933  Clinical Social Work is seeking post-discharge placement for this patient at the [place] level of care (*CSW will initial, date and re-position this form in  chart as items are completed):      Patient/family provided with [facility] Department's list of facilities offering this level of care within the geographic area requested by the patient (or if unable, by the patient's family).  Yes   Patient/family informed of their freedom to choose among providers that offer the needed level of care, that participate in Medicare, Medicaid or managed care program needed by the patient, have an available bed and are willing to accept the patient.  Yes   Patient/family informed of [place]'s ownership interest in [facility] and [facility], as well as of the fact that they are under no obligation to receive care at these facilities.  PASRR submitted to EDS on 11/03/16     PASRR number received on 11/03/16     Existing PASRR number confirmed on       FL2 transmitted to all facilities in geographic area requested by pt/family on 11/03/16     FL2 transmitted to all facilities within larger geographic area on       Patient informed that his/her managed care company has contracts with or will negotiate with certain facilities, including the following:            Patient/family informed of bed offers received.  Patient chooses bed at       Physician recommends and patient chooses bed at      Patient to be transferred to   on  .  Patient to be transferred to facility by       Patient family notified on   of transfer.  Name of family member notified:        PHYSICIAN       Additional Comment:    _______________________________________________ Luretha Rued, [place] 11/03/2016, 9:38  AM

## 2016-11-03 NOTE — H&P (View-Only) (Signed)
Reason for Consult:  Right hip pain following a fall Referring Physician: Eber Jones MD  Cassie Peters is an 83 y.o. female.  HPI: Patient is an 83 year old female who sustained right hip fracture following a fall at home.  She unfortunately fell at home in her bathroom and had immediate onset of pain. She was transferred to [facility] after care transferred over to Dr. Adriana Mccallum.  Radiographs reviewed and found to have a nondisplaced intertrochanteric hip fracture that would require surgery.  Plan for surgical fixation as per Dr. Alvan Dame  Past Medical History:  Diagnosis Date  . Angio-edema    one episode in 1st week of March 2018 - did not see dr.- son and pt state mouth and tongue swelled so bad she could not talk  . Hypertension   . Stroke [facility]) 2011   right arm and right leg effected  . Thyroid disorder     Past Surgical History:  Procedure Laterality Date  . ABDOMINAL HYSTERECTOMY     "partial hysterectomy"  . right kidney surgery  approx 2010   "cyst removed from kidney"    History reviewed. No pertinent family history.  Social History:  reports that she quit smoking about 25 years ago. Her smoking use included Cigarettes. She quit after 20.00 years of use. She has never used smokeless tobacco. She reports that she does not drink alcohol or use drugs.  Allergies:  Allergies  Allergen Reactions  . Cardura [Doxazosin Mesylate]     Allergic to pill form  . Clonidine Derivatives     Allergic to pill form  . Prinzide [Lisinopril-Hydrochlorothiazide]     Allergic to pill form    Medications:  Prior to Admission:  Prescriptions Prior to Admission  Medication Sig Dispense Refill Last Dose  . ALPRAZolam (XANAX) 0.5 MG tablet Take 0.5-1 mg by mouth at bedtime as needed. For anxiety/sleep.   11/01/2016 at Unknown time  . amLODipine (NORVASC) 10 MG tablet Take 10 mg by mouth daily.   11/02/2016 at Unknown time  . aspirin 325 MG tablet Take 325 mg by mouth daily.   11/02/2016 at  Unknown time  . atorvastatin (LIPITOR) 20 MG tablet Take 20 mg by mouth at bedtime.   11/01/2016 at Unknown time  . cholecalciferol (VITAMIN D) 1000 units tablet Take 1,000 Units by mouth daily.   11/02/2016 at Unknown time  . citalopram (CELEXA) 10 MG tablet Take 10 mg by mouth daily.   11/02/2016 at Unknown time  . cloNIDine (CATAPRES - DOSED IN MG/24 HR) 0.1 mg/24hr patch Place 1 patch onto the skin once a week. Applies on Fridays.   Past Week at Unknown time  . ferrous sulfate 325 (65 FE) MG tablet Take 325 mg by mouth daily with breakfast.   11/02/2016 at Unknown time  . hydrALAZINE (APRESOLINE) 50 MG tablet Take 50 mg by mouth 4 (four) times daily.   11/02/2016 at Unknown time  . lisinopril (PRINIVIL,ZESTRIL) 40 MG tablet Take 40 mg by mouth daily.   11/02/2016 at Unknown time  . nortriptyline (PAMELOR) 25 MG capsule Take 25 mg by mouth 2 (two) times daily.   11/02/2016 at Unknown time  . pantoprazole (PROTONIX) 40 MG tablet Take 40 mg by mouth daily as needed. For indigestion.   11/02/2016 at Unknown time  . senna (SENOKOT) 8.6 MG TABS Take 1 tablet by mouth daily as needed. For constipation.   11/02/2016 at Unknown time  . famotidine (PEPCID) 20 MG tablet Take 1  tablet (20 mg total) by mouth 2 (two) times daily. 30 tablet 0     Results for orders placed or performed during the hospital encounter of 11/02/16 (from the past 48 hour(s))  CBC with Differential     Status: Abnormal   Collection Time: 11/02/16  8:06 PM  Result Value Ref Range   WBC 16.3 (H) 4.0 - 10.5 K/uL   RBC 3.81 (L) 3.87 - 5.11 MIL/uL   Hemoglobin 10.5 (L) 12.0 - 15.0 g/dL   HCT 32.2 (L) 36.0 - 46.0 %   MCV 84.5 78.0 - 100.0 fL   MCH 27.6 26.0 - 34.0 pg   MCHC 32.6 30.0 - 36.0 g/dL   RDW 14.7 11.5 - 15.5 %   Platelets 118 (L) 150 - 400 K/uL    Comment: SPECIMEN CHECKED FOR CLOTS REPEATED TO VERIFY PLATELET COUNT CONFIRMED BY SMEAR    Neutrophils Relative % 94 %   Lymphocytes Relative 4 %   Monocytes Relative 2 %   Eosinophils  Relative 0 %   Basophils Relative 0 %   Neutro Abs 15.3 (H) 1.7 - 7.7 K/uL   Lymphs Abs 0.7 0.7 - 4.0 K/uL   Monocytes Absolute 0.3 0.1 - 1.0 K/uL   Eosinophils Absolute 0.0 0.0 - 0.7 K/uL   Basophils Absolute 0.0 0.0 - 0.1 K/uL   Smear Review LARGE PLATELETS PRESENT   Basic metabolic panel     Status: Abnormal   Collection Time: 11/02/16  8:06 PM  Result Value Ref Range   Sodium 139 135 - 145 mmol/L   Potassium 4.9 3.5 - 5.1 mmol/L   Chloride 109 101 - 111 mmol/L   CO2 20 (L) 22 - 32 mmol/L   Glucose, Bld 145 (H) 65 - 99 mg/dL   BUN 48 (H) 6 - 20 mg/dL   Creatinine, Ser 2.63 (H) 0.44 - 1.00 mg/dL   Calcium 8.7 (L) 8.9 - 10.3 mg/dL   GFR calc non Af Amer 16 (L) >60 mL/min   GFR calc Af Amer 19 (L) >60 mL/min    Comment: (NOTE) The eGFR has been calculated using the CKD EPI equation. This calculation has not been validated in all clinical situations. eGFR's persistently <60 mL/min signify possible Chronic Kidney Disease.    Anion gap 10 5 - 15  Protime-INR     Status: None   Collection Time: 11/02/16  9:35 PM  Result Value Ref Range   Prothrombin Time 13.9 11.4 - 15.2 seconds   INR 1.07   APTT     Status: Abnormal   Collection Time: 11/02/16  9:35 PM  Result Value Ref Range   aPTT 23 (L) 24 - 36 seconds  Vitamin B12     Status: None   Collection Time: 11/02/16 11:43 PM  Result Value Ref Range   Vitamin B-12 500 180 - 914 pg/mL    Comment: (NOTE) This assay is not validated for testing neonatal or myeloproliferative syndrome specimens for Vitamin B12 levels. Performed at [facility], [place] [address] [place], [place] [address]   Folate     Status: None   Collection Time: 11/02/16 11:43 PM  Result Value Ref Range   Folate 8.8 >5.9 ng/mL    Comment: Performed at [facility], [place] [address], [place], [place] [address]  Iron and TIBC     Status: Abnormal   Collection Time: 11/02/16 11:43 PM  Result Value Ref Range   Iron 14 (L) 28 - 170 ug/dL  TIBC 230  (L) 250 - 450 ug/dL   Saturation Ratios 6 (L) 10.4 - 31.8 %   UIBC 216 ug/dL    Comment: Performed at [facility], [place] [address], [place], [place] [address]  Ferritin     Status: Abnormal   Collection Time: 11/02/16 11:43 PM  Result Value Ref Range   Ferritin 534 (H) 11 - 307 ng/mL    Comment: Performed at [facility], [place] [address], [place], [place] [address]  Reticulocytes     Status: Abnormal   Collection Time: 11/02/16 11:43 PM  Result Value Ref Range   Retic Ct Pct 1.1 0.4 - 3.1 %   RBC. 3.52 (L) 3.87 - 5.11 MIL/uL   Retic Count, Manual 38.7 19.0 - 186.0 K/uL  TSH     Status: Abnormal   Collection Time: 11/02/16 11:43 PM  Result Value Ref Range   TSH 0.075 (L) 0.350 - 4.500 uIU/mL    Comment: Performed by a 3rd Generation assay with a functional sensitivity of <=0.01 uIU/mL.  Surgical PCR screen     Status: None   Collection Time: 11/03/16 12:09 AM  Result Value Ref Range   MRSA, PCR NEGATIVE NEGATIVE   Staphylococcus aureus NEGATIVE NEGATIVE    Comment:        The Xpert SA Assay (FDA approved for NASAL specimens in patients over 3 years of age), is one component of a comprehensive surveillance program.  Test performance has been validated by [facility] for patients greater than or equal to 72 year old. It is not intended to diagnose infection nor to guide or monitor treatment.   Urinalysis, Routine w reflex microscopic     Status: Abnormal   Collection Time: 11/03/16  1:58 AM  Result Value Ref Range   Color, Urine YELLOW YELLOW   APPearance CLEAR CLEAR   Specific Gravity, Urine 1.012 1.005 - 1.030   pH 5.0 5.0 - 8.0   Glucose, UA NEGATIVE NEGATIVE mg/dL   Hgb urine dipstick NEGATIVE NEGATIVE   Bilirubin Urine NEGATIVE NEGATIVE   Ketones, ur NEGATIVE NEGATIVE mg/dL   Protein, ur 100 (A) NEGATIVE mg/dL   Nitrite NEGATIVE NEGATIVE   Leukocytes, UA NEGATIVE NEGATIVE   RBC / HPF 0-5 0 - 5 RBC/hpf   WBC, UA 0-5 0 - 5 WBC/hpf   Bacteria, UA RARE  (A) NONE SEEN   Squamous Epithelial / LPF 0-5 (A) NONE SEEN   Mucous PRESENT   Type and screen Order type and screen if day of surgery is less than 15 days from draw of preadmission visit or order morning of surgery if day of surgery is greater than 6 days from preadmission visit.     Status: None   Collection Time: 11/03/16  4:11 AM  Result Value Ref Range   ABO/RH(D) A POS    Antibody Screen NEG    Sample Expiration 11/06/2016   CBC WITH DIFFERENTIAL     Status: Abnormal   Collection Time: 11/03/16  4:17 AM  Result Value Ref Range   WBC 16.0 (H) 4.0 - 10.5 K/uL   RBC 3.57 (L) 3.87 - 5.11 MIL/uL   Hemoglobin 9.9 (L) 12.0 - 15.0 g/dL   HCT 30.3 (L) 36.0 - 46.0 %   MCV 84.9 78.0 - 100.0 fL   MCH 27.7 26.0 - 34.0 pg   MCHC 32.7 30.0 - 36.0 g/dL   RDW 14.9 11.5 - 15.5 %   Platelets 120 (L) 150 - 400 K/uL  Comment: REPEATED TO VERIFY   Neutrophils Relative % 84 %   Neutro Abs 13.4 (H) 1.7 - 7.7 K/uL   Lymphocytes Relative 9 %   Lymphs Abs 1.5 0.7 - 4.0 K/uL   Monocytes Relative 7 %   Monocytes Absolute 1.0 0.1 - 1.0 K/uL   Eosinophils Relative 0 %   Eosinophils Absolute 0.0 0.0 - 0.7 K/uL   Basophils Relative 0 %   Basophils Absolute 0.0 0.0 - 0.1 K/uL  Comprehensive metabolic panel     Status: Abnormal   Collection Time: 11/03/16  4:17 AM  Result Value Ref Range   Sodium 139 135 - 145 mmol/L   Potassium 4.8 3.5 - 5.1 mmol/L   Chloride 110 101 - 111 mmol/L   CO2 20 (L) 22 - 32 mmol/L   Glucose, Bld 122 (H) 65 - 99 mg/dL   BUN 46 (H) 6 - 20 mg/dL   Creatinine, Ser 2.65 (H) 0.44 - 1.00 mg/dL   Calcium 8.9 8.9 - 10.3 mg/dL   Total Protein 7.0 6.5 - 8.1 g/dL   Albumin 3.8 3.5 - 5.0 g/dL   AST 20 15 - 41 U/L   ALT 14 14 - 54 U/L   Alkaline Phosphatase 73 38 - 126 U/L   Total Bilirubin 0.5 0.3 - 1.2 mg/dL   GFR calc non Af Amer 16 (L) >60 mL/min   GFR calc Af Amer 18 (L) >60 mL/min    Comment: (NOTE) The eGFR has been calculated using the CKD EPI equation. This  calculation has not been validated in all clinical situations. eGFR's persistently <60 mL/min signify possible Chronic Kidney Disease.    Anion gap 9 5 - 15    Dg Chest 1 View  Result Date: 11/02/2016 CLINICAL DATA:  Preop for right hip fracture fixation. EXAM: CHEST 1 VIEW COMPARISON:  06/16/2015 FINDINGS: The heart is within normal limits in size and stable. There is moderate tortuosity and calcification of the thoracic aorta. The lungs are clear of acute process. No pleural effusion. The bony thorax is intact. IMPRESSION: No acute cardiopulmonary findings. Electronically Signed   By: Marijo Sanes M.D.   On: 11/02/2016 20:01   Dg Knee Right Port  Result Date: 11/02/2016 CLINICAL DATA:  [place] in bathroom this morning. Known RIGHT hip fracture. EXAM: PORTABLE RIGHT KNEE - 1-2 VIEW COMPARISON:  None. FINDINGS: No evidence of fracture, dislocation, or joint effusion. No evidence of arthropathy or other focal bone abnormality. Mild meniscal calcifications. Moderate vascular calcifications. IMPRESSION: No acute fracture deformity or dislocation. Electronically Signed   By: Elon Alas M.D.   On: 11/02/2016 21:50   Dg Hip Unilat W Or Wo Pelvis 2-3 Views Right  Result Date: 11/02/2016 CLINICAL DATA:  [place] in bathroom today at 1100 hours. Subsequent RIGHT hip pain. EXAM: DG HIP (WITH OR WITHOUT PELVIS) 2-3V RIGHT COMPARISON:  None. FINDINGS: Acute RIGHT femur intertrochanteric fracture in alignment. Avulsion of the RIGHT lesser trochanter. No dislocation. No destructive bony lesions. Mild bilateral hip osteoarthrosis. Moderate degenerative change of the included lumbar spine. Moderate vascular calcifications. Phleboliths project in the pelvis. IMPRESSION: Acute nondisplaced RIGHT femur intertrochanteric fracture. No dislocation. Electronically Signed   By: Elon Alas M.D.   On: 11/02/2016 19:19    ROS Blood pressure (!) 182/54, pulse 91, temperature 100 F (37.8 C), temperature source  Axillary, resp. rate 18, height _0  (1.651 m), weight 60.1 kg (132 lb 7.9 oz), SpO2 94 %. Physical Exam 83 year old female  Right lower  extremity -  NVI to the right leg. No obvious deformity. Pain noted on hip roll on exam.  Assessment/Plan: Acute nondisplaced righ femur intertrochanteric fracture.  NPO, Consent, Preop Plan for ORIF Right Troch Nailing.  Rane Dumm 11/03/2016, 9:36 AM

## 2016-11-03 NOTE — Interval H&P Note (Signed)
History and Physical Interval Note:  11/03/2016 3:51 PM  Cassie Peters  has presented today for surgery, with the diagnosis of right intertroch fracture  The various methods of treatment have been discussed with the patient and family. After consideration of risks, benefits and other options for treatment, the patient has consented to  Procedure(s): INTRAMEDULLARY (IM) NAIL INTERTROCHANTRIC (Right) as a surgical intervention .  The patient's history has been reviewed, patient examined, no change in status, stable for surgery.  I have reviewed the patient's chart and labs.  Questions were answered to the patient's satisfaction.     Mauri Pole

## 2016-11-03 NOTE — Anesthesia Procedure Notes (Signed)
Procedure Name: Intubation Date/Time: 11/03/2016 4:34 PM Performed by: Manus Gunning, Josia Cueva J Pre-anesthesia Checklist: Patient identified, Emergency Drugs available, Suction available, Patient being monitored and Timeout performed Patient Re-evaluated:Patient Re-evaluated prior to inductionOxygen Delivery Method: Circle system utilized Preoxygenation: Pre-oxygenation with 100% oxygen Intubation Type: IV induction Ventilation: Mask ventilation without difficulty Laryngoscope Size: Mac and 3 Grade View: Grade I Tube type: Oral Tube size: 7.0 mm Number of attempts: 1 Placement Confirmation: ETT inserted through vocal cords under direct vision,  positive ETCO2 and breath sounds checked- equal and bilateral Secured at: 21 cm Tube secured with: Tape Dental Injury: Teeth and Oropharynx as per pre-operative assessment

## 2016-11-03 NOTE — Progress Notes (Signed)
Initial Nutrition Assessment  DOCUMENTATION CODES:   Not applicable  INTERVENTION:   Ensure Enlive po BID, each supplement provides 350 kcal and 20 grams of protein  NUTRITION DIAGNOSIS:   Increased nutrient needs related to other (see comment) (hip surgery ) as evidenced by increased estimated needs from protein.   GOAL:   Patient will meet greater than or equal to 90% of their needs  MONITOR:   PO intake, Supplement acceptance, Labs, Weight trends  REASON FOR ASSESSMENT:   Consult Assessment of nutrition requirement/status  ASSESSMENT:   83 y.o. woman with a history of CVA with residual right sided weakness (she ambulates with a cane at baseline), HTN, probable CKD 3 (available labs reviewed in our system; erratic trend, best creatinine was 1.29 in May 2013), thyroid disease, and recent episode of angioedema (possibly related to ACE-I; she did not come to medical attention) who presents to the ED via EMS for evaluation of acute hip pain after having a mechanical fall in her bathroom today.  Pt found to have acute nondisplaced right femur intertrochanteric fracture.   Met with pt in room today. Pt reports good appetite and oral intake pta. Pt is nauseas and currently NPO for surgery today. Pt likes Ensure; RD will order. Per pt, he weight has been stable for a while. No weight history in chart for this patient.   Medications reviewed and include: cefazolin, dexamethasone, ferrous sulfate, protonix, senokot, hydromorphone  Labs reviewed: BUN 46(H), creat 2.65(H) Wbc- 16(H), Hgb 9.9(L), Hct 30.3(L)  Nutrition-Focused physical exam completed. Findings are no fat depletion, no muscle depletion, and no edema.   Diet Order:  Diet NPO time specified Except for: Sips with Meds Diet NPO time specified Except for: Sips with Meds  Skin:  Reviewed, no issues  Last BM:  3/7  Height:   Ht Readings from Last 1 Encounters:  11/02/16 5' 5"  (1.651 m)    Weight:   Wt Readings  from Last 1 Encounters:  11/03/16 135 lb 12.9 oz (61.6 kg)    Ideal Body Weight:  56.8 kg  BMI:  Body mass index is 22.6 kg/m.  Estimated Nutritional Needs:   Kcal:  1500-1800kcal/day   Protein:  72-84g/day   Fluid:  >1.5L/day   EDUCATION NEEDS:   No education needs identified at this time  Koleen Distance, RD, LDN Pager #[phone]

## 2016-11-03 NOTE — NC FL2 (Signed)
[place] MEDICAID FL2 LEVEL OF CARE SCREENING TOOL     IDENTIFICATION  Patient Name: Cassie Peters Birthdate: 12/04/1933 Sex: female Admission Date (Current Location): 11/02/2016  [facility] and [place] Number:  Herbalist and Address:  [facility],  [place] [address], [place]      Provider Number: [id number]  Attending Physician Name and Address:  Janece Canterbury, MD  Relative Name and Phone Number:       Current Level of Care: SNF Recommended Level of Care: [place] Prior Approval Number:    Date Approved/Denied:   PASRR Number: [id number]  Discharge Plan: SNF    Current Diagnoses: Patient Active Problem List   Diagnosis Date Noted  . Hip fracture ([place]) 11/02/2016  . CKD (chronic kidney disease) stage 3, GFR 30-59 ml/min 11/02/2016  . History of CVA (cerebrovascular accident) 11/02/2016  . Right sided weakness 11/02/2016  . Anemia 11/02/2016  . Thrombocytopenia ([place]) 11/02/2016  . Leukocytosis 11/02/2016  . Nonspecific abnormal electrocardiogram (ECG) (EKG)     Orientation RESPIRATION BLADDER Height & Weight     Self, Time, Situation, Place  Normal Continent Weight: 132 lb 7.9 oz (60.1 kg) Height:  5\' 5"  (165.1 cm)  BEHAVIORAL SYMPTOMS/MOOD NEUROLOGICAL BOWEL NUTRITION STATUS  Other (Comment) (No Behaviors)   Continent Diet  AMBULATORY STATUS COMMUNICATION OF NEEDS Skin   Extensive Assist Verbally Surgical wounds                       Personal Care Assistance Level of Assistance  Bathing, Feeding, Dressing Bathing Assistance: Limited assistance   Dressing Assistance: Limited assistance     Functional Limitations Info  Sight, Hearing, Speech Sight Info: Adequate Hearing Info: Adequate Speech Info: Adequate    SPECIAL CARE FACTORS FREQUENCY  PT (By licensed PT), OT (By licensed OT)     PT Frequency: 5x wk OT Frequency: 5x wk            Contractures Contractures Info: Not present     Additional Factors Info  Code Status Code Status Info: Full Code             Current Medications (11/03/2016):  This is the current hospital active medication list Current Facility-Administered Medications  Medication Dose Route Frequency Provider Last Rate Last Dose  . 0.9 %  sodium chloride infusion   Intravenous Continuous Virgel Manifold, MD 75 mL/hr at 11/02/16 2324    . ALPRAZolam Duanne Moron) tablet 0.5 mg  0.5 mg Oral QHS PRN Lily Kocher, MD      . amLODipine (NORVASC) tablet 10 mg  10 mg Oral Daily Lily Kocher, MD      . aspirin tablet 325 mg  325 mg Oral Daily Lily Kocher, MD      . atorvastatin (LIPITOR) tablet 20 mg  20 mg Oral QHS Lily Kocher, MD   20 mg at 11/03/16 0036  . ceFAZolin (ANCEF) IVPB 2g/100 mL premix  2 g Intravenous On Call to [place], PA-C      . chlorhexidine (HIBICLENS) 4 % liquid 4 application  60 mL Topical Once Ainsley Spinner, PA-C      . citalopram (CELEXA) tablet 10 mg  10 mg Oral Daily Lily Kocher, MD      . cloNIDine (CATAPRES - Dosed in mg/24 hr) patch 0.1 mg  0.1 mg Transdermal Weekly Janece Canterbury, MD      . ferrous sulfate tablet 325 mg  325 mg Oral Q breakfast  Lily Kocher, MD      . hydrALAZINE (APRESOLINE) injection 10 mg  10 mg Intravenous Q6H PRN Janece Canterbury, MD      . hydrALAZINE (APRESOLINE) tablet 50 mg  50 mg Oral QID Lily Kocher, MD   50 mg at 11/03/16 0036  . HYDROcodone-acetaminophen (NORCO/VICODIN) 5-325 MG per tablet 1-2 tablet  1-2 tablet Oral Q6H PRN Lily Kocher, MD      . HYDROmorphone (DILAUDID) injection 0.5 mg  0.5 mg Intravenous Q3H PRN Lily Kocher, MD   0.5 mg at 11/03/16 0340  . LORazepam (ATIVAN) injection 0.5 mg  0.5 mg Intravenous Once Virgel Manifold, MD      . nortriptyline (PAMELOR) capsule 25 mg  25 mg Oral QHS Lily Kocher, MD      . ondansetron [facility]) injection 4 mg  4 mg Intravenous Q6H PRN Janece Canterbury, MD      . pantoprazole (PROTONIX) EC tablet 20 mg  20 mg Oral Daily Lily Kocher, MD      .  povidone-iodine 10 % swab 2 application  2 application Topical Once Ainsley Spinner, PA-C      . senna (SENOKOT) tablet 8.6 mg  1 tablet Oral BID Lily Kocher, MD   8.6 mg at 11/03/16 0009  . sodium chloride 0.9 % bolus 500 mL  500 mL Intravenous Once Virgel Manifold, MD         Discharge Medications: Please see discharge summary for a list of discharge medications.  Relevant Imaging Results:  Relevant Lab Results:   Additional Information SS # [id number]  Avir Deruiter, Randall An, LCSW

## 2016-11-03 NOTE — Progress Notes (Signed)
SLP Cancellation Note  Patient Details Name: Cassie Peters MRN: [id number] DOB: 12/04/1933   Cancelled treatment:        Pt is NPO for potential surgery.   Will reattempt at later time.   Luanna Salk, [place] [facility] SLP [place], [place] 11/03/2016, 8:04 AM

## 2016-11-03 NOTE — Brief Op Note (Signed)
11/02/2016 - 11/03/2016  4:32 PM  PATIENT:  Cassie Peters  83 y.o. female  PRE-OPERATIVE DIAGNOSIS:  right intertrochanteric femur fracture  POST-OPERATIVE DIAGNOSIS:  right intertrochanteric femur fracture   PROCEDURE:  Procedure(s): INTRAMEDULLARY (IM) NAIL INTERTROCHANTRIC (Right)  SURGEON:  Surgeon(s) and Role:    * Paralee Cancel, MD - Primary  PHYSICIAN ASSISTANT: None  ASSISTANTS: surgical team  ANESTHESIA:   General  EBL:  Total I/O In: 680 [P.O.:80; I.V.:600] Out: 700 [Urine:700]  <150cc  BLOOD ADMINISTERED:none  DRAINS: none   LOCAL MEDICATIONS USED:  NONE  SPECIMEN:  No Specimen  DISPOSITION OF SPECIMEN:  N/A  COUNTS:  YES  TOURNIQUET:  * No tourniquets in log *  DICTATION: .Other Dictation: Dictation Number [id number]  PLAN OF CARE: Admit to inpatient   PATIENT DISPOSITION:  PACU - hemodynamically stable.   Delay start of Pharmacological VTE agent (>24hrs) due to surgical blood loss or risk of bleeding: no

## 2016-11-03 NOTE — Consult Note (Signed)
Reason for Consult:  Right hip pain following a fall Referring Physician: Eber Jones MD  Cassie Peters is an 83 y.o. female.  HPI: Patient is an 83 year old female who sustained right hip fracture following a fall at home.  She unfortunately fell at home in her bathroom and had immediate onset of pain. She was transferred to [facility] after care transferred over to Dr. Adriana Mccallum.  Radiographs reviewed and found to have a nondisplaced intertrochanteric hip fracture that would require surgery.  Plan for surgical fixation as per Dr. Alvan Dame  Past Medical History:  Diagnosis Date  . Angio-edema    one episode in 1st week of March 2018 - did not see dr.- son and pt state mouth and tongue swelled so bad she could not talk  . Hypertension   . Stroke [facility]) 2011   right arm and right leg effected  . Thyroid disorder     Past Surgical History:  Procedure Laterality Date  . ABDOMINAL HYSTERECTOMY     "partial hysterectomy"  . right kidney surgery  approx 2010   "cyst removed from kidney"    History reviewed. No pertinent family history.  Social History:  reports that she quit smoking about 25 years ago. Her smoking use included Cigarettes. She quit after 20.00 years of use. She has never used smokeless tobacco. She reports that she does not drink alcohol or use drugs.  Allergies:  Allergies  Allergen Reactions  . Cardura [Doxazosin Mesylate]     Allergic to pill form  . Clonidine Derivatives     Allergic to pill form  . Prinzide [Lisinopril-Hydrochlorothiazide]     Allergic to pill form    Medications:  Prior to Admission:  Prescriptions Prior to Admission  Medication Sig Dispense Refill Last Dose  . ALPRAZolam (XANAX) 0.5 MG tablet Take 0.5-1 mg by mouth at bedtime as needed. For anxiety/sleep.   11/01/2016 at Unknown time  . amLODipine (NORVASC) 10 MG tablet Take 10 mg by mouth daily.   11/02/2016 at Unknown time  . aspirin 325 MG tablet Take 325 mg by mouth daily.   11/02/2016 at  Unknown time  . atorvastatin (LIPITOR) 20 MG tablet Take 20 mg by mouth at bedtime.   11/01/2016 at Unknown time  . cholecalciferol (VITAMIN D) 1000 units tablet Take 1,000 Units by mouth daily.   11/02/2016 at Unknown time  . citalopram (CELEXA) 10 MG tablet Take 10 mg by mouth daily.   11/02/2016 at Unknown time  . cloNIDine (CATAPRES - DOSED IN MG/24 HR) 0.1 mg/24hr patch Place 1 patch onto the skin once a week. Applies on Fridays.   Past Week at Unknown time  . ferrous sulfate 325 (65 FE) MG tablet Take 325 mg by mouth daily with breakfast.   11/02/2016 at Unknown time  . hydrALAZINE (APRESOLINE) 50 MG tablet Take 50 mg by mouth 4 (four) times daily.   11/02/2016 at Unknown time  . lisinopril (PRINIVIL,ZESTRIL) 40 MG tablet Take 40 mg by mouth daily.   11/02/2016 at Unknown time  . nortriptyline (PAMELOR) 25 MG capsule Take 25 mg by mouth 2 (two) times daily.   11/02/2016 at Unknown time  . pantoprazole (PROTONIX) 40 MG tablet Take 40 mg by mouth daily as needed. For indigestion.   11/02/2016 at Unknown time  . senna (SENOKOT) 8.6 MG TABS Take 1 tablet by mouth daily as needed. For constipation.   11/02/2016 at Unknown time  . famotidine (PEPCID) 20 MG tablet Take 1  tablet (20 mg total) by mouth 2 (two) times daily. 30 tablet 0     Results for orders placed or performed during the hospital encounter of 11/02/16 (from the past 48 hour(s))  CBC with Differential     Status: Abnormal   Collection Time: 11/02/16  8:06 PM  Result Value Ref Range   WBC 16.3 (H) 4.0 - 10.5 K/uL   RBC 3.81 (L) 3.87 - 5.11 MIL/uL   Hemoglobin 10.5 (L) 12.0 - 15.0 g/dL   HCT 32.2 (L) 36.0 - 46.0 %   MCV 84.5 78.0 - 100.0 fL   MCH 27.6 26.0 - 34.0 pg   MCHC 32.6 30.0 - 36.0 g/dL   RDW 14.7 11.5 - 15.5 %   Platelets 118 (L) 150 - 400 K/uL    Comment: SPECIMEN CHECKED FOR CLOTS REPEATED TO VERIFY PLATELET COUNT CONFIRMED BY SMEAR    Neutrophils Relative % 94 %   Lymphocytes Relative 4 %   Monocytes Relative 2 %   Eosinophils  Relative 0 %   Basophils Relative 0 %   Neutro Abs 15.3 (H) 1.7 - 7.7 K/uL   Lymphs Abs 0.7 0.7 - 4.0 K/uL   Monocytes Absolute 0.3 0.1 - 1.0 K/uL   Eosinophils Absolute 0.0 0.0 - 0.7 K/uL   Basophils Absolute 0.0 0.0 - 0.1 K/uL   Smear Review LARGE PLATELETS PRESENT   Basic metabolic panel     Status: Abnormal   Collection Time: 11/02/16  8:06 PM  Result Value Ref Range   Sodium 139 135 - 145 mmol/L   Potassium 4.9 3.5 - 5.1 mmol/L   Chloride 109 101 - 111 mmol/L   CO2 20 (L) 22 - 32 mmol/L   Glucose, Bld 145 (H) 65 - 99 mg/dL   BUN 48 (H) 6 - 20 mg/dL   Creatinine, Ser 2.63 (H) 0.44 - 1.00 mg/dL   Calcium 8.7 (L) 8.9 - 10.3 mg/dL   GFR calc non Af Amer 16 (L) >60 mL/min   GFR calc Af Amer 19 (L) >60 mL/min    Comment: (NOTE) The eGFR has been calculated using the CKD EPI equation. This calculation has not been validated in all clinical situations. eGFR's persistently <60 mL/min signify possible Chronic Kidney Disease.    Anion gap 10 5 - 15  Protime-INR     Status: None   Collection Time: 11/02/16  9:35 PM  Result Value Ref Range   Prothrombin Time 13.9 11.4 - 15.2 seconds   INR 1.07   APTT     Status: Abnormal   Collection Time: 11/02/16  9:35 PM  Result Value Ref Range   aPTT 23 (L) 24 - 36 seconds  Vitamin B12     Status: None   Collection Time: 11/02/16 11:43 PM  Result Value Ref Range   Vitamin B-12 500 180 - 914 pg/mL    Comment: (NOTE) This assay is not validated for testing neonatal or myeloproliferative syndrome specimens for Vitamin B12 levels. Performed at [facility], [place] [address] [place], [place] [address]   Folate     Status: None   Collection Time: 11/02/16 11:43 PM  Result Value Ref Range   Folate 8.8 >5.9 ng/mL    Comment: Performed at [facility], [place] [address], [place], [place] [address]  Iron and TIBC     Status: Abnormal   Collection Time: 11/02/16 11:43 PM  Result Value Ref Range   Iron 14 (L) 28 - 170 ug/dL  TIBC 230  (L) 250 - 450 ug/dL   Saturation Ratios 6 (L) 10.4 - 31.8 %   UIBC 216 ug/dL    Comment: Performed at [facility], [address] [place], [place] [address]  Ferritin     Status: Abnormal   Collection Time: 11/02/16 11:43 PM  Result Value Ref Range   Ferritin 534 (H) 11 - 307 ng/mL    Comment: Performed at [facility], [address] [place], [place] [address]  Reticulocytes     Status: Abnormal   Collection Time: 11/02/16 11:43 PM  Result Value Ref Range   Retic Ct Pct 1.1 0.4 - 3.1 %   RBC. 3.52 (L) 3.87 - 5.11 MIL/uL   Retic Count, Manual 38.7 19.0 - 186.0 K/uL  TSH     Status: Abnormal   Collection Time: 11/02/16 11:43 PM  Result Value Ref Range   TSH 0.075 (L) 0.350 - 4.500 uIU/mL    Comment: Performed by a 3rd Generation assay with a functional sensitivity of <=0.01 uIU/mL.  Surgical PCR screen     Status: None   Collection Time: 11/03/16 12:09 AM  Result Value Ref Range   MRSA, PCR NEGATIVE NEGATIVE   Staphylococcus aureus NEGATIVE NEGATIVE    Comment:        The Xpert SA Assay (FDA approved for NASAL specimens in patients over 21 years of age), is one component of a comprehensive surveillance program.  Test performance has been validated by [facility] for patients greater than or equal to 1 year old. It is not intended to diagnose infection nor to guide or monitor treatment.   Urinalysis, Routine w reflex microscopic     Status: Abnormal   Collection Time: 11/03/16  1:58 AM  Result Value Ref Range   Color, Urine YELLOW YELLOW   APPearance CLEAR CLEAR   Specific Gravity, Urine 1.012 1.005 - 1.030   pH 5.0 5.0 - 8.0   Glucose, UA NEGATIVE NEGATIVE mg/dL   Hgb urine dipstick NEGATIVE NEGATIVE   Bilirubin Urine NEGATIVE NEGATIVE   Ketones, ur NEGATIVE NEGATIVE mg/dL   Protein, ur 100 (A) NEGATIVE mg/dL   Nitrite NEGATIVE NEGATIVE   Leukocytes, UA NEGATIVE NEGATIVE   RBC / HPF 0-5 0 - 5 RBC/hpf   WBC, UA 0-5 0 - 5 WBC/hpf   Bacteria, UA RARE  (A) NONE SEEN   Squamous Epithelial / LPF 0-5 (A) NONE SEEN   Mucous PRESENT   Type and screen Order type and screen if day of surgery is less than 15 days from draw of preadmission visit or order morning of surgery if day of surgery is greater than 6 days from preadmission visit.     Status: None   Collection Time: 11/03/16  4:11 AM  Result Value Ref Range   ABO/RH(D) A POS    Antibody Screen NEG    Sample Expiration 11/06/2016   CBC WITH DIFFERENTIAL     Status: Abnormal   Collection Time: 11/03/16  4:17 AM  Result Value Ref Range   WBC 16.0 (H) 4.0 - 10.5 K/uL   RBC 3.57 (L) 3.87 - 5.11 MIL/uL   Hemoglobin 9.9 (L) 12.0 - 15.0 g/dL   HCT 30.3 (L) 36.0 - 46.0 %   MCV 84.9 78.0 - 100.0 fL   MCH 27.7 26.0 - 34.0 pg   MCHC 32.7 30.0 - 36.0 g/dL   RDW 14.9 11.5 - 15.5 %   Platelets 120 (L) 150 - 400 K/uL      Comment: REPEATED TO VERIFY   Neutrophils Relative % 84 %   Neutro Abs 13.4 (H) 1.7 - 7.7 K/uL   Lymphocytes Relative 9 %   Lymphs Abs 1.5 0.7 - 4.0 K/uL   Monocytes Relative 7 %   Monocytes Absolute 1.0 0.1 - 1.0 K/uL   Eosinophils Relative 0 %   Eosinophils Absolute 0.0 0.0 - 0.7 K/uL   Basophils Relative 0 %   Basophils Absolute 0.0 0.0 - 0.1 K/uL  Comprehensive metabolic panel     Status: Abnormal   Collection Time: 11/03/16  4:17 AM  Result Value Ref Range   Sodium 139 135 - 145 mmol/L   Potassium 4.8 3.5 - 5.1 mmol/L   Chloride 110 101 - 111 mmol/L   CO2 20 (L) 22 - 32 mmol/L   Glucose, Bld 122 (H) 65 - 99 mg/dL   BUN 46 (H) 6 - 20 mg/dL   Creatinine, Ser 2.65 (H) 0.44 - 1.00 mg/dL   Calcium 8.9 8.9 - 10.3 mg/dL   Total Protein 7.0 6.5 - 8.1 g/dL   Albumin 3.8 3.5 - 5.0 g/dL   AST 20 15 - 41 U/L   ALT 14 14 - 54 U/L   Alkaline Phosphatase 73 38 - 126 U/L   Total Bilirubin 0.5 0.3 - 1.2 mg/dL   GFR calc non Af Amer 16 (L) >60 mL/min   GFR calc Af Amer 18 (L) >60 mL/min    Comment: (NOTE) The eGFR has been calculated using the CKD EPI equation. This  calculation has not been validated in all clinical situations. eGFR's persistently <60 mL/min signify possible Chronic Kidney Disease.    Anion gap 9 5 - 15    Dg Chest 1 View  Result Date: 11/02/2016 CLINICAL DATA:  Preop for right hip fracture fixation. EXAM: CHEST 1 VIEW COMPARISON:  06/16/2015 FINDINGS: The heart is within normal limits in size and stable. There is moderate tortuosity and calcification of the thoracic aorta. The lungs are clear of acute process. No pleural effusion. The bony thorax is intact. IMPRESSION: No acute cardiopulmonary findings. Electronically Signed   By: Marijo Sanes M.D.   On: 11/02/2016 20:01   Dg Knee Right Port  Result Date: 11/02/2016 CLINICAL DATA:  [place] in bathroom this morning. Known RIGHT hip fracture. EXAM: PORTABLE RIGHT KNEE - 1-2 VIEW COMPARISON:  None. FINDINGS: No evidence of fracture, dislocation, or joint effusion. No evidence of arthropathy or other focal bone abnormality. Mild meniscal calcifications. Moderate vascular calcifications. IMPRESSION: No acute fracture deformity or dislocation. Electronically Signed   By: Elon Alas M.D.   On: 11/02/2016 21:50   Dg Hip Unilat W Or Wo Pelvis 2-3 Views Right  Result Date: 11/02/2016 CLINICAL DATA:  [place] in bathroom today at 1100 hours. Subsequent RIGHT hip pain. EXAM: DG HIP (WITH OR WITHOUT PELVIS) 2-3V RIGHT COMPARISON:  None. FINDINGS: Acute RIGHT femur intertrochanteric fracture in alignment. Avulsion of the RIGHT lesser trochanter. No dislocation. No destructive bony lesions. Mild bilateral hip osteoarthrosis. Moderate degenerative change of the included lumbar spine. Moderate vascular calcifications. Phleboliths project in the pelvis. IMPRESSION: Acute nondisplaced RIGHT femur intertrochanteric fracture. No dislocation. Electronically Signed   By: Elon Alas M.D.   On: 11/02/2016 19:19    ROS Blood pressure (!) 182/54, pulse 91, temperature 100 F (37.8 C), temperature source  Axillary, resp. rate 18, height _0  (1.651 m), weight 60.1 kg (132 lb 7.9 oz), SpO2 94 %. Physical Exam 83 year old female  Right lower  extremity -  NVI to the right leg. No obvious deformity. Pain noted on hip roll on exam.  Assessment/Plan: Acute nondisplaced righ femur intertrochanteric fracture.  NPO, Consent, Preop Plan for ORIF Right Troch Nailing.  Cassie Peters 11/03/2016, 9:36 AM

## 2016-11-03 NOTE — Anesthesia Preprocedure Evaluation (Addendum)
Anesthesia Evaluation  Patient identified by MRN, date of birth, ID band Patient awake    Reviewed: Allergy & Precautions, H&P , NPO status , Patient's Chart, lab work & pertinent test results  Airway Mallampati: II  TM Distance: >3 FB Neck ROM: Full    Dental no notable dental hx.    Pulmonary neg pulmonary ROS, former smoker,    Pulmonary exam normal breath sounds clear to auscultation       Cardiovascular hypertension, Pt. on medications Normal cardiovascular exam Rhythm:Regular Rate:Normal     Neuro/Psych CVA, Residual Symptoms negative psych ROS   GI/Hepatic negative GI ROS, Neg liver ROS,   Endo/Other  negative endocrine ROS  Renal/GU Renal InsufficiencyRenal disease  negative genitourinary   Musculoskeletal negative musculoskeletal ROS (+)   Abdominal   Peds negative pediatric ROS (+)  Hematology negative hematology ROS (+) anemia ,   Anesthesia Other Findings   Reproductive/Obstetrics negative OB ROS                            Anesthesia Physical Anesthesia Plan  ASA: III  Anesthesia Plan: General   Post-op Pain Management:    Induction: Intravenous  Airway Management Planned: Oral ETT  Additional Equipment:   Intra-op Plan:   Post-operative Plan: Extubation in OR  Informed Consent: I have reviewed the patients History and Physical, chart, labs and discussed the procedure including the risks, benefits and alternatives for the proposed anesthesia with the patient or authorized representative who has indicated his/her understanding and acceptance.   Dental advisory given  Plan Discussed with: CRNA and Surgeon  Anesthesia Plan Comments:       Anesthesia Quick Evaluation

## 2016-11-04 DIAGNOSIS — S72001A Fracture of unspecified part of neck of right femur, initial encounter for closed fracture: Secondary | ICD-10-CM

## 2016-11-04 DIAGNOSIS — R531 Weakness: Secondary | ICD-10-CM

## 2016-11-04 DIAGNOSIS — Z8673 Personal history of transient ischemic attack (TIA), and cerebral infarction without residual deficits: Secondary | ICD-10-CM

## 2016-11-04 DIAGNOSIS — Z9889 Other specified postprocedural states: Secondary | ICD-10-CM

## 2016-11-04 DIAGNOSIS — D696 Thrombocytopenia, unspecified: Secondary | ICD-10-CM

## 2016-11-04 DIAGNOSIS — D72829 Elevated white blood cell count, unspecified: Secondary | ICD-10-CM

## 2016-11-04 DIAGNOSIS — N183 Chronic kidney disease, stage 3 (moderate): Secondary | ICD-10-CM

## 2016-11-04 DIAGNOSIS — R9431 Abnormal electrocardiogram [ECG] [EKG]: Secondary | ICD-10-CM

## 2016-11-04 DIAGNOSIS — D649 Anemia, unspecified: Secondary | ICD-10-CM

## 2016-11-04 LAB — CBC
HCT: 27.3 % — ABNORMAL LOW (ref 36.0–46.0)
Hemoglobin: 8.3 g/dL — ABNORMAL LOW (ref 12.0–15.0)
MCH: 26.7 pg (ref 26.0–34.0)
MCHC: 30.4 g/dL (ref 30.0–36.0)
MCV: 87.8 fL (ref 78.0–100.0)
PLATELETS: 108 10*3/uL — AB (ref 150–400)
RBC: 3.11 MIL/uL — AB (ref 3.87–5.11)
RDW: 15.3 % (ref 11.5–15.5)
WBC: 13.1 10*3/uL — ABNORMAL HIGH (ref 4.0–10.5)

## 2016-11-04 LAB — BASIC METABOLIC PANEL
Anion gap: 8 (ref 5–15)
BUN: 45 mg/dL — AB (ref 6–20)
CO2: 20 mmol/L — ABNORMAL LOW (ref 22–32)
CREATININE: 2.88 mg/dL — AB (ref 0.44–1.00)
Calcium: 8.5 mg/dL — ABNORMAL LOW (ref 8.9–10.3)
Chloride: 116 mmol/L — ABNORMAL HIGH (ref 101–111)
GFR calc Af Amer: 17 mL/min — ABNORMAL LOW (ref >60)
GFR, EST NON AFRICAN AMERICAN: 14 mL/min — AB (ref >60)
Glucose, Bld: 116 mg/dL — ABNORMAL HIGH (ref 65–99)
Potassium: 4.4 mmol/L (ref 3.5–5.1)
SODIUM: 144 mmol/L (ref 135–145)

## 2016-11-04 LAB — URINE CULTURE

## 2016-11-04 LAB — VITAMIN D 25 HYDROXY (VIT D DEFICIENCY, FRACTURES): Vit D, 25-Hydroxy: 23 ng/mL — ABNORMAL LOW (ref 30.0–100.0)

## 2016-11-04 MED ORDER — ASPIRIN 325 MG PO TABS
325.0000 mg | ORAL_TABLET | Freq: Every day | ORAL | Status: DC
  Administered 2016-11-04 – 2016-11-06 (×3): 325 mg via ORAL
  Filled 2016-11-04 (×3): qty 1

## 2016-11-04 MED ORDER — AMLODIPINE BESYLATE 10 MG PO TABS
10.0000 mg | ORAL_TABLET | Freq: Every day | ORAL | Status: DC
  Administered 2016-11-04 – 2016-11-06 (×3): 10 mg via ORAL
  Filled 2016-11-04 (×3): qty 1

## 2016-11-04 MED ORDER — HYDRALAZINE HCL 20 MG/ML IJ SOLN
10.0000 mg | Freq: Once | INTRAMUSCULAR | Status: AC
  Administered 2016-11-04: 10 mg via INTRAVENOUS
  Filled 2016-11-04: qty 1

## 2016-11-04 MED ORDER — HYDRALAZINE HCL 20 MG/ML IJ SOLN
20.0000 mg | Freq: Four times a day (QID) | INTRAMUSCULAR | Status: DC | PRN
Start: 2016-11-04 — End: 2016-11-06
  Administered 2016-11-06: 20 mg via INTRAVENOUS
  Filled 2016-11-04: qty 1

## 2016-11-04 NOTE — Progress Notes (Signed)
Subjective: 1 Day Post-Op Procedure(s) (LRB): INTRAMEDULLARY (IM) NAIL INTERTROCHANTRIC (Right) Patient reports pain as moderate.  She says the hip is sore.  Working with PT.  No c/o otherwise.  Objective: Vital signs in last 24 hours: Temp:  [97.8 F (36.6 C)-99.9 F (37.7 C)] 99.3 F (37.4 C) (03/10 0606) Pulse Rate:  [89-93] 93 (03/10 0606) Resp:  [16-18] 18 (03/10 0606) BP: (156-206)/(49-64) 156/50 (03/10 0606) SpO2:  [95 %-98 %] 98 % (03/10 0606) Weight:  [61.6 kg (135 lb 12.9 oz)] 61.6 kg (135 lb 12.9 oz) (03/09 1144)  Intake/Output from previous day: 03/09 0701 - 03/10 0700 In: 1280 [P.O.:80; I.V.:1200] Out: 950 [Urine:900; Blood:50] Intake/Output this shift: No intake/output data recorded.   Recent Labs  11/02/16 2006 11/03/16 0417 11/03/16 1952 11/04/16 0719  HGB 10.5* 9.9* 9.1* 8.3*    Recent Labs  11/03/16 1952 11/04/16 0719  WBC 19.2* 13.1*  RBC 3.34* 3.11*  HCT 29.4* 27.3*  PLT 128* 108*    Recent Labs  11/03/16 0417 11/03/16 1952 11/04/16 0719  NA 139  --  144  K 4.8  --  4.4  CL 110  --  116*  CO2 20*  --  20*  BUN 46*  --  45*  CREATININE 2.65* 2.79* 2.88*  GLUCOSE 122*  --  116*  CALCIUM 8.9  --  8.5*    Recent Labs  11/02/16 2135  INR 1.07    PE:  elderly female in nad.  R hip wounds dressed and dry.  Assessment/Plan: 1 Day Post-Op Procedure(s) (LRB): INTRAMEDULLARY (IM) NAIL INTERTROCHANTRIC (Right) Up with PT today.  WBAT on R LE.  Lovenox for DVT prophylaxis.  Wylene Simmer 11/04/2016, 9:29 AM

## 2016-11-04 NOTE — Evaluation (Signed)
Clinical/Bedside Swallow Evaluation Patient Details  Name: Cassie Peters MRN: [id number] Date of Birth: 12/04/1933  Today's Date: 11/04/2016 Time: SLP Start Time (ACUTE ONLY): 7989 SLP Stop Time (ACUTE ONLY): 1715 SLP Time Calculation (min) (ACUTE ONLY): 10 min  Past Medical History:  Past Medical History:  Diagnosis Date  . Angio-edema    one episode in 1st week of March 2018 - did not see dr.- son and pt state mouth and tongue swelled so bad she could not talk  . Hypertension   . Stroke [facility]) 2011   right arm and right leg effected  . Thyroid disorder    Past Surgical History:  Past Surgical History:  Procedure Laterality Date  . ABDOMINAL HYSTERECTOMY     "partial hysterectomy"  . right kidney surgery  approx 2010   "cyst removed from kidney"   HPI:  Pt is an 83 yo female admitted on 11/02/16 following a fall resulting in a right hip fracture. Pt underwent an IM nail on 11/03/16. PMH significant for CVA affecting right side 2011, HTN, CKD3. Thyroid disease, angiodema from medications. CXR 11/02/16 no acute findings. No prior swallowing evaluations in chart.    Assessment / Plan / Recommendation Clinical Impression  Patient presents with oropharyngeal swallow which appears within functional limits at bedside with appearance of timely swallow initiation to palpation and adequate airway protection. No overt signs of aspiration noted despite challenging with consecutive sips of thin liquids via straw in excess of 3oz. Limited trials of solids due to patient's nausea. With dentures, rotary mastication appears within functional limits. Mild residue which patient clears with liquid wash. Patient remains at mild risk for aspiration given prior CVA, ill-fitting dentition. Recommend continuing regular diet with thin liquids. No further skilled SLP needs identified. SLP will s/o.  SLP Visit Diagnosis: Dysphagia, unspecified (R13.10)    Aspiration Risk  Mild aspiration risk    Diet Recommendation  Regular;Thin liquid   Liquid Administration via: Cup;Straw Medication Administration: Whole meds with liquid Supervision: Patient able to self feed Compensations: Slow rate;Small sips/bites Postural Changes: Seated upright at 90 degrees    Other  Recommendations Oral Care Recommendations: Oral care BID   Follow up Recommendations None      Frequency and Duration            Prognosis Prognosis for Safe Diet Advancement: Good      Swallow Study   General Date of Onset: 11/02/16 HPI: Pt is an 83 yo female admitted on 11/02/16 following a fall resulting in a right hip fracture. Pt underwent an IM nail on 11/03/16. PMH significant for CVA affecting right side 2011, HTN, CKD3. Thyroid disease, angiodema from medications. CXR 11/02/16 no acute findings. No prior swallowing evaluations in chart.  Type of Study: Bedside Swallow Evaluation Previous Swallow Assessment: none in chart Diet Prior to this Study: Regular;Thin liquids Temperature Spikes Noted: No Respiratory Status: Room air History of Recent Intubation: No Behavior/Cognition: Alert;Cooperative;Pleasant mood Oral Cavity Assessment: Within Functional Limits Oral Care Completed by SLP: No Oral Cavity - Dentition: Dentures, top;Dentures, bottom Vision: Functional for self-feeding Self-Feeding Abilities: Able to feed self Patient Positioning: Upright in bed Baseline Vocal Quality: Normal Volitional Cough: Other (Comment) (declines, due to nausea) Volitional Swallow: Able to elicit    Oral/Motor/Sensory Function Overall Oral Motor/Sensory Function: Within functional limits   Ice Chips Ice chips: Within functional limits   Thin Liquid Thin Liquid: Within functional limits Presentation: Straw    Nectar Thick Nectar Thick Liquid: Not tested  Honey Thick Honey Thick Liquid: Not tested   Puree Puree: Not tested   Solid   GO   Solid: Within functional limits        Aliene Altes 11/04/2016,5:30 PM  Deneise Lever, [place]  CF-SLP Speech-Language Pathologist [phone]

## 2016-11-04 NOTE — Evaluation (Signed)
Physical Therapy Evaluation Patient Details Name: Cassie Peters MRN: [id number] DOB: 12/04/1933 Today's Date: 11/04/2016   History of Present Illness  Pt is an 83 yo female admitted on 11/02/16 following a fall resulting in a right hip fracture. Pt underwent an IM nail on 11/03/16. PMH significant for CVA affecting right side 2011, HTN, CKD3. Thyroid disease, angiodema from medications.   Clinical Impression  Pt is POD 1 following the above procedure. Prior to admission, pt lived with her son and was receiving occasional assistance for mobility and ADL's and IADL's. Pt would like to return home with her son, but with assistance required, recommending SNF for short term rehab in order to improve her mobility prior to returning home. Pt requires mod to max A in order to perform mobilities. Pt will require further acute PT services in order to assist with transition to venue listed below.     Follow Up Recommendations SNF;Supervision/Assistance - 24 hour    Equipment Recommendations  None recommended by PT    Recommendations for Other Services       Precautions / Restrictions Precautions Precautions: Fall Restrictions Weight Bearing Restrictions: Yes RLE Weight Bearing: Weight bearing as tolerated      Mobility  Bed Mobility Overal bed mobility: Needs Assistance Bed Mobility: Supine to Sit     Supine to sit: Mod assist     General bed mobility comments: Mod A to get EOB with assistance to move RLE and for bringing trunk upright at EOB  Transfers Overall transfer level: Needs assistance Equipment used: None Transfers: Sit to/from [facility] Sit to Stand: Max assist;Mod assist Stand pivot transfers: Max assist;Mod assist       General transfer comment: Max A initally to stand and pivot to BSC, Mod A from commode to recliner. pt is able to maintain standing with assistance to get cleaned up before transferring to recliner.   Ambulation/Gait                 Stairs            Wheelchair Mobility    Modified Rankin (Stroke Patients Only)       Balance Overall balance assessment: Needs assistance Sitting-balance support: Feet supported;Bilateral upper extremity supported Sitting balance-Leahy Scale: Fair     Standing balance support: Bilateral upper extremity supported Standing balance-Leahy Scale: Zero Standing balance comment: Relies on clincian to maintain standing                             Pertinent Vitals/Pain Pain Assessment: 0-10 Pain Score: 5  Pain Location: right hip Pain Descriptors / Indicators: Aching;Grimacing;Guarding;Sore Pain Intervention(s): Monitored during session;Premedicated before session;Ice applied;Patient requesting pain meds-RN notified    Home Living Family/patient expects to be discharged to:: Private residence Living Arrangements: Children Available Help at Discharge: Family;Available 24 hours/day Type of Home: House Home Access: Stairs to enter Entrance Stairs-Rails: None Entrance Stairs-Number of Steps: 5 Home Layout: One level Home Equipment: Bedside commode;Walker - 2 wheels;Cane - single point;Shower seat - built in;Hand held shower head;Adaptive equipment      Prior Function Level of Independence: Needs assistance;Independent with assistive device(s)   Gait / Transfers Assistance Needed: used [place] for mobility and needed help up front steps  ADL's / Homemaking Assistance Needed: son helps with drying off her back and fastening her bra, son does all cooking  Comments: Son assists with all Adls and IADLs     Hand  Dominance   Dominant Hand: Left    Extremity/Trunk Assessment   Upper Extremity Assessment Upper Extremity Assessment: Defer to OT evaluation    Lower Extremity Assessment Lower Extremity Assessment: RLE deficits/detail RLE Deficits / Details: pt with post op pain and weakness as well as weakness from previous CVA affecting right side.  RLE: Unable  to fully assess due to pain       Communication   Communication: No difficulties  Cognition Arousal/Alertness: Awake/alert Behavior During Therapy: WFL for tasks assessed/performed Overall Cognitive Status: Within Functional Limits for tasks assessed                      General Comments      Exercises     Assessment/Plan    PT Assessment Patient needs continued PT services  PT Problem List Decreased strength;Decreased range of motion;Decreased activity tolerance;Decreased balance;Decreased mobility;Decreased knowledge of use of DME;Pain       PT Treatment Interventions DME instruction;Gait training;Functional mobility training;Therapeutic activities;Therapeutic exercise;Balance training;Patient/family education    PT Goals (Current goals can be found in the Care Plan section)  Acute Rehab PT Goals Patient Stated Goal: to go home.  PT Goal Formulation: With patient Time For Goal Achievement: 11/11/16 Potential to Achieve Goals: Fair    Frequency Min 3X/week   Barriers to discharge        Co-evaluation               End of Session Equipment Utilized During Treatment: Gait belt Activity Tolerance: Patient limited by pain Patient left: in chair;with call bell/phone within reach;with chair alarm set Nurse Communication: Mobility status PT Visit Diagnosis: Difficulty in walking, not elsewhere classified (R26.2);Pain;History of falling (Z91.81) Pain - Right/Left: Right Pain - part of body: Hip         Time: 5465-6812 PT Time Calculation (min) (ACUTE ONLY): 43 min   Charges:   PT Evaluation $PT Eval Moderate Complexity: 1 Procedure PT Treatments $Therapeutic Activity: 23-37 mins   PT G Codes:         Scheryl Marten PT, DPT  [phone]  11/04/2016, 12:45 PM

## 2016-11-04 NOTE — Discharge Instructions (Signed)
Weight bearing as tolerated right leg

## 2016-11-04 NOTE — Evaluation (Signed)
Occupational Therapy Evaluation Patient Details Name: Cassie Peters MRN: [id number] DOB: 12/04/1933 Today's Date: 11/04/2016    History of Present Illness Pt is an 83 yo female admitted on 11/02/16 following a fall resulting in a right hip fracture. Pt underwent an IM nail on 11/03/16. PMH significant for CVA affecting right side 2011, HTN, CKD3. Thyroid disease, angiodema from medications.    Clinical Impression   Pt reports she was mod I with ADL PTA. Currently pt overall max assist for squat pivot transfers; limited by pain and R sided weakness from prior CVA. Pt mod-max assist for ADL at this time. Recommending SNF for follow up to maximize independence and safety with ADL and functional mobility prior to return home. Pt would benefit from continued skilled OT to address established goals.    Follow Up Recommendations  SNF;Supervision/Assistance - 24 hour    Equipment Recommendations  Other (comment) (TBD at next venue)    Recommendations for Other Services       Precautions / Restrictions Precautions Precautions: Fall Restrictions Weight Bearing Restrictions: Yes RLE Weight Bearing: Weight bearing as tolerated      Mobility Bed Mobility Overal bed mobility: Needs Assistance Bed Mobility: Sit to Supine     Supine to sit: Mod assist Sit to supine: Mod assist   General bed mobility comments: Assist for LEs back to bed  Transfers Overall transfer level: Needs assistance Equipment used: None Transfers: Squat Pivot Transfers Sit to Stand: Max assist;Mod assist Stand pivot transfers: Max assist;Mod assist Squat pivot transfers: Max assist     General transfer comment: Pt very painful and not assisting much with standing. Only able to perform squat pivot x3 with max assist    Balance Overall balance assessment: Needs assistance Sitting-balance support: Feet supported;Single extremity supported Sitting balance-Leahy Scale: Fair     Standing balance support: Bilateral  upper extremity supported Standing balance-Leahy Scale: Zero Standing balance comment: max external assist to maintain standing balance                            ADL Overall ADL's : Needs assistance/impaired     Grooming: Minimal assistance;Sitting   Upper Body Bathing: Moderate assistance;Sitting   Lower Body Bathing: Maximal assistance;+2 for physical assistance;Sit to/from stand   Upper Body Dressing : Minimal assistance;Sitting   Lower Body Dressing: Maximal assistance;+2 for physical assistance;Sit to/from stand   Toilet Transfer: Maximal assistance;Squat-pivot;BSC   Toileting- Clothing Manipulation and Hygiene: Total assistance;Sit to/from stand;+2 for physical assistance       Functional mobility during ADLs: Maximal assistance (for squat pivot only)       Vision         Perception     Praxis      Pertinent Vitals/Pain Pain Assessment: Faces Pain Score: 5  Faces Pain Scale: Hurts whole lot Pain Location: R hip Pain Descriptors / Indicators: Aching;Moaning Pain Intervention(s): Monitored during session;Limited activity within patient's tolerance;Repositioned;Patient requesting pain meds-RN notified     Hand Dominance Left   Extremity/Trunk Assessment Upper Extremity Assessment Upper Extremity Assessment: RUE deficits/detail RUE Deficits / Details: limited strength and ROM from prior CVA RUE Coordination: decreased fine motor;decreased gross motor   Lower Extremity Assessment Lower Extremity Assessment: Defer to PT evaluation RLE Deficits / Details: pt with post op pain and weakness as well as weakness from previous CVA affecting right side.  RLE: Unable to fully assess due to pain   Cervical / Trunk Assessment Cervical /  Trunk Assessment: Kyphotic   Communication Communication Communication: No difficulties   Cognition Arousal/Alertness: Awake/alert Behavior During Therapy: WFL for tasks assessed/performed Overall Cognitive Status:  Within Functional Limits for tasks assessed                     General Comments       Exercises       Shoulder Instructions      Home Living Family/patient expects to be discharged to:: Private residence Living Arrangements: Children Available Help at Discharge: Family;Available 24 hours/day Type of Home: House Home Access: Stairs to enter [employer] of Steps: 5 Entrance Stairs-Rails: None Home Layout: One level     Bathroom Shower/Tub: Occupational psychologist: Standard     Home Equipment: Bedside commode;Walker - 2 wheels;Cane - single point;Shower seat - built in;Hand held shower head;Adaptive equipment Adaptive Equipment: Reacher;Sock aid;Long-handled shoe horn;Long-handled sponge        Prior Functioning/Environment Level of Independence: Needs assistance;Independent with assistive device(s)  Gait / Transfers Assistance Needed: used [place] for mobility and needed help up front steps ADL's / Homemaking Assistance Needed: son helps with drying off her back and fastening her bra, son does all cooking Communication / Swallowing Assistance Needed: no issues per client Comments: Son assists with all Adls and IADLs        OT Problem List: Decreased strength;Decreased range of motion;Decreased activity tolerance;Impaired balance (sitting and/or standing);Decreased coordination;Decreased knowledge of use of DME or AE;Decreased knowledge of precautions;Pain;Impaired UE functional use      OT Treatment/Interventions: Self-care/ADL training;Therapeutic exercise;Energy conservation;DME and/or AE instruction;Therapeutic activities;Patient/family education;Balance training    OT Goals(Current goals can be found in the care plan section) Acute Rehab OT Goals Patient Stated Goal: to go home.  OT Goal Formulation: With patient Time For Goal Achievement: 11/18/16 Potential to Achieve Goals: Good ADL Goals Pt Will Perform Grooming: with  supervision;sitting Pt Will Perform Lower Body Bathing: with min assist;sit to/from stand Pt Will Perform Lower Body Dressing: with min assist;sit to/from stand Pt Will Transfer to Toilet: with min assist;stand pivot transfer;bedside commode Pt Will Perform Toileting - Clothing Manipulation and hygiene: with min assist;sit to/from stand  OT Frequency: Min 2X/week   Barriers to D/C:            Co-evaluation              End of Session Equipment Utilized During Treatment: Gait belt Nurse Communication: Mobility status;Patient requests pain meds  Activity Tolerance: Patient limited by pain Patient left: in bed;with call bell/phone within reach;with nursing/sitter in room  OT Visit Diagnosis: Other abnormalities of gait and mobility (R26.89);Pain Pain - Right/Left: Right Pain - part of body: Hip                ADL either performed or assessed with clinical judgement  Time: 1100-1130 OT Time Calculation (min): 30 min Charges:  OT General Charges $OT Visit: 1 Procedure OT Evaluation $OT Eval Moderate Complexity: 1 Procedure OT Treatments $Self Care/Home Management : 8-22 mins G-Codes:     Mel Almond A. Ulice Brilliant, M.S., OTR/L Pager: [place] 11/04/2016, 3:36 PM

## 2016-11-04 NOTE — Anesthesia Postprocedure Evaluation (Signed)
Anesthesia Post Note  Patient: Cassie Peters  Procedure(s) Performed: Procedure(s) (LRB): INTRAMEDULLARY (IM) NAIL INTERTROCHANTRIC (Right)  Patient location during evaluation: PACU Anesthesia Type: General Level of consciousness: awake Pain management: pain level controlled Vital Signs Assessment: post-procedure vital signs reviewed and stable Respiratory status: spontaneous breathing Cardiovascular status: stable Anesthetic complications: no       Last Vitals:  Vitals:   11/04/16 0026 11/04/16 0606  BP: (!) 199/64 (!) 156/50  Pulse: 93 93  Resp: 18 18  Temp: 37.3 C 37.4 C    Last Pain:  Vitals:   11/04/16 0606  TempSrc: Oral  PainSc:                  Patricia Perales

## 2016-11-04 NOTE — Progress Notes (Signed)
Cassie NOTE    Cassie Peters  DTO:[id number] DOB: 12/04/1933 DOA: 11/02/2016 PCP: [name], Cassie Peters   Chief Complaint  Patient presents with  . Hip Pain     Brief Narrative:  HPI on 11/02/2016 by Dr. Lily Kocher Cassie Peters is a 83 y.o. woman with a history of CVA with residual right sided weakness (she ambulates with a cane at baseline), HTN, probable CKD 3 (available labs reviewed in our system; erratic trend, best creatinine was 1.29 in May 2013), thyroid disease, and recent episode of angioedema (possibly related to ACE-I; she did not come to medical attention) who presents to the ED via EMS for evaluation of acute hip pain after having a mechanical fall in her bathroom today.  She feels that she was in her baseline state of health before the falls.  She denies any preceding aura, headache, chest pain, shortness of breath, palpitations, or light-headedness.  She did not lose consciousness.  Her son was present in the home and her heard her go down.  He rushed to her attention immediately.  Patient was awake and alert, unable to get up of the ground, and complaining of pain.  No stigmata of seizure activity.  No recent antibiotics for any reason.  No unexplained chest pain, SOB, or lower extremity edema.  No lower urinary tract symptoms. Assessment & Plan   Acute nondisplaced right intertrochanteric femur fracture  -Orthopedics consulted and appreciated -S/p INTRAMEDULLARY (IM) NAIL INTERTROCHANTRIC (Right) -Continue pain control -PT and OT consulted  Accelerated HTN  -secondary to acute pain and nausea/vomiting -Better controlled today -Avoid ACE-I, ARB due to renal insufficiency -Continue pain management -Continue hydralazine and clonidine patch -Restart amlodipine  Nausea and vomiting -likely related to pain and pain medications -Seems to have improved -Continue antiemetics PRN  AKI vs CKD, Stage III -Creatinine upon admission was 2.63 -Last creatinine was 1.29  in 2013 -Currently creatinine 2.88 -Will continue to monitor BMP -continue gentle IVF  Leukocytosis Possible she has stress demargination and low grade fever due to fracture -repeat CBC in AM  Prior CVA -Continue statin -Restart aspirin   Anemia  -Anemia panel suggests AOCD -B12 and folate wnl  -Continue iron supplementation  -Hemoglobin currently 8.3 -will continue to monitor CBC   Thrombocytopenia, chronic  -Continue to monitor CBC  Depression/Anxiety -Continue celexa and PRN Xanax  GI prophylaxis -PPI  DVT Prophylaxis  SCDs  Code Status: Full  Family Communication: None at bedside  Disposition Plan: Admitted, pending PT/OT   Consultants Orthopedic surgery  Procedures  INTRAMEDULLARY (IM) NAIL INTERTROCHANTRIC (Right)  Antibiotics   Anti-infectives    Start     Dose/Rate Route Frequency Ordered Stop   11/03/16 2200  ceFAZolin (ANCEF) IVPB 1 g/50 mL premix     1 g 100 mL/hr over 30 Minutes Intravenous Every 6 hours 11/03/16 1847 11/04/16 0450   11/03/16 1700  ceFAZolin (ANCEF) IVPB 2g/100 mL premix     2 g 200 mL/hr over 30 Minutes Intravenous On call to O.R. 11/02/16 2123 11/03/16 1627   11/03/16 1000  ceFAZolin (ANCEF) IVPB 2g/100 mL premix  Status:  Discontinued     2 g 200 mL/hr over 30 Minutes Intravenous On call to O.R. 11/03/16 0950 11/03/16 1016      Subjective:   [name] seen and examined today.  Patient complains of her leg/hip pain and soreness.  Denies nausea or vomiting. Denies chest pain, shortness of breath, abdominal pain, headache, dizziness.   Objective:   Vitals:  11/03/16 1830 11/03/16 2018 11/04/16 0026 11/04/16 0606  BP: (!) 171/64 (!) 167/50 (!) 199/64 (!) 156/50  Pulse:  92 93 93  Resp:  18 18 18   Temp: 97.9 F (36.6 C) 99.1 F (37.3 C) 99.2 F (37.3 C) 99.3 F (37.4 C)  TempSrc:  Oral Oral Oral  SpO2:  95% 97% 98%  Weight:      Height:        Intake/Output Summary (Last 24 hours) at 11/04/16  1138 Last data filed at 11/04/16 1100  Gross per 24 hour  Intake          1840.83 ml  Output              600 ml  Net          1240.83 ml   Filed Weights   11/02/16 2300 11/03/16 1144  Weight: 60.1 kg (132 lb 7.9 oz) 61.6 kg (135 lb 12.9 oz)    Exam  General: Well developed, well nourished, NAD, appears stated age  HEENT: NCAT,  mucous membranes moist.   Cardiovascular: S1 S2 auscultated, no rubs, murmurs or gallops. Regular rate and rhythm.  Respiratory: Clear to auscultation bilaterally with equal chest rise  Abdomen: Soft, nontender, nondistended, + bowel sounds  Extremities: warm dry without cyanosis clubbing or edema. RUE contracted  Neuro: AAOx3, right sided weakness  Psych: Flat affected   Data Reviewed: I have personally reviewed following labs and imaging studies  CBC:  Recent Labs Lab 11/02/16 2006 11/03/16 0417 11/03/16 1952 11/04/16 0719  WBC 16.3* 16.0* 19.2* 13.1*  NEUTROABS 15.3* 13.4*  --   --   HGB 10.5* 9.9* 9.1* 8.3*  HCT 32.2* 30.3* 29.4* 27.3*  MCV 84.5 84.9 88.0 87.8  PLT 118* 120* 128* 563*   Basic Metabolic Panel:  Recent Labs Lab 11/02/16 2006 11/03/16 0417 11/03/16 1952 11/04/16 0719  NA 139 139  --  144  K 4.9 4.8  --  4.4  CL 109 110  --  116*  CO2 20* 20*  --  20*  GLUCOSE 145* 122*  --  116*  BUN 48* 46*  --  45*  CREATININE 2.63* 2.65* 2.79* 2.88*  CALCIUM 8.7* 8.9  --  8.5*   GFR: Estimated Creatinine Clearance: 14 mL/min (by C-G formula based on SCr of 2.88 mg/dL (H)). Liver Function Tests:  Recent Labs Lab 11/03/16 0417  AST 20  ALT 14  ALKPHOS 73  BILITOT 0.5  PROT 7.0  ALBUMIN 3.8   No results for input(s): LIPASE, AMYLASE in the last 168 hours. No results for input(s): AMMONIA in the last 168 hours. Coagulation Profile:  Recent Labs Lab 11/02/16 2135  INR 1.07   Cardiac Enzymes: No results for input(s): CKTOTAL, CKMB, CKMBINDEX, TROPONINI in the last 168 hours. BNP (last 3 results) No  results for input(s): PROBNP in the last 8760 hours. HbA1C: No results for input(s): HGBA1C in the last 72 hours. CBG: No results for input(s): GLUCAP in the last 168 hours. Lipid Profile: No results for input(s): CHOL, HDL, LDLCALC, TRIG, CHOLHDL, LDLDIRECT in the last 72 hours. Thyroid Function Tests:  Recent Labs  11/02/16 2343  TSH 0.075*   Anemia Panel:  Recent Labs  11/02/16 2343  VITAMINB12 500  FOLATE 8.8  FERRITIN 534*  TIBC 230*  IRON 14*  RETICCTPCT 1.1   Urine analysis:    Component Value Date/Time   COLORURINE YELLOW 11/03/2016 0158   APPEARANCEUR CLEAR 11/03/2016 0158   LABSPEC 1.012 11/03/2016  0158   PHURINE 5.0 11/03/2016 0158   GLUCOSEU NEGATIVE 11/03/2016 0158   HGBUR NEGATIVE 11/03/2016 0158   BILIRUBINUR NEGATIVE 11/03/2016 0158   KETONESUR NEGATIVE 11/03/2016 0158   PROTEINUR 100 (A) 11/03/2016 0158   UROBILINOGEN 0.2 01/01/2012 1745   NITRITE NEGATIVE 11/03/2016 0158   LEUKOCYTESUR NEGATIVE 11/03/2016 0158   Sepsis Labs: @LABRCNTIP (procalcitonin:4,lacticidven:4)  ) Recent Results (from the past 240 hour(s))  Culture, Urine     Status: Abnormal   Collection Time: 11/02/16  1:58 AM  Result Value Ref Range Status   Specimen Description URINE, RANDOM  Final   Special Requests NONE  Final   Culture MULTIPLE SPECIES PRESENT, SUGGEST RECOLLECTION (A)  Final   Report Status 11/04/2016 FINAL  Final  Surgical PCR screen     Status: None   Collection Time: 11/03/16 12:09 AM  Result Value Ref Range Status   MRSA, PCR NEGATIVE NEGATIVE Final   Staphylococcus aureus NEGATIVE NEGATIVE Final    Comment:        The Xpert SA Assay (FDA approved for NASAL specimens in patients over 29 years of age), is one component of a comprehensive surveillance program.  Test performance has been validated by [facility] for patients greater than or equal to 39 year old. It is not intended to diagnose infection nor to guide or monitor treatment.        Radiology Studies: Dg Chest 1 View  Result Date: 11/02/2016 CLINICAL DATA:  Preop for right hip fracture fixation. EXAM: CHEST 1 VIEW COMPARISON:  06/16/2015 FINDINGS: The heart is within normal limits in size and stable. There is moderate tortuosity and calcification of the thoracic aorta. The lungs are clear of acute process. No pleural effusion. The bony thorax is intact. IMPRESSION: No acute cardiopulmonary findings. Electronically Signed   By: Marijo Sanes M.D.   On: 11/02/2016 20:01   Dg Knee Right Port  Result Date: 11/02/2016 CLINICAL DATA:  [place] in bathroom this morning. Known RIGHT hip fracture. EXAM: PORTABLE RIGHT KNEE - 1-2 VIEW COMPARISON:  None. FINDINGS: No evidence of fracture, dislocation, or joint effusion. No evidence of arthropathy or other focal bone abnormality. Mild meniscal calcifications. Moderate vascular calcifications. IMPRESSION: No acute fracture deformity or dislocation. Electronically Signed   By: Elon Alas M.D.   On: 11/02/2016 21:50   Dg C-arm 1-60 Min  Result Date: 11/03/2016 CLINICAL DATA:  Right hip fracture repair EXAM: OPERATIVE RIGHT HIP (WITH PELVIS IF PERFORMED)  VIEWS TECHNIQUE: Fluoroscopic spot image(s) were submitted for interpretation post-operatively. Fluoroscopic time: 64 seconds. Images: 2 COMPARISON:  None. FINDINGS: An intramedullary rod and nail have been placed across the intertrochanteric fracture. No dislocation. The inferior trochanter remains displaced. IMPRESSION: Fracture repair as above Electronically Signed   By: Dorise Bullion III M.D   On: 11/03/2016 17:50   Dg Hip Operative Unilat W Or W/o Pelvis Right  Result Date: 11/03/2016 CLINICAL DATA:  Right hip fracture repair EXAM: OPERATIVE RIGHT HIP (WITH PELVIS IF PERFORMED)  VIEWS TECHNIQUE: Fluoroscopic spot image(s) were submitted for interpretation post-operatively. Fluoroscopic time: 64 seconds. Images: 2 COMPARISON:  None. FINDINGS: An intramedullary rod and nail have been  placed across the intertrochanteric fracture. No dislocation. The inferior trochanter remains displaced. IMPRESSION: Fracture repair as above Electronically Signed   By: Dorise Bullion III M.D   On: 11/03/2016 17:50   Dg Hip Unilat W Or Wo Pelvis 2-3 Views Right  Result Date: 11/02/2016 CLINICAL DATA:  [place] in bathroom today at 1100 hours. Subsequent RIGHT  hip pain. EXAM: DG HIP (WITH OR WITHOUT PELVIS) 2-3V RIGHT COMPARISON:  None. FINDINGS: Acute RIGHT femur intertrochanteric fracture in alignment. Avulsion of the RIGHT lesser trochanter. No dislocation. No destructive bony lesions. Mild bilateral hip osteoarthrosis. Moderate degenerative change of the included lumbar spine. Moderate vascular calcifications. Phleboliths project in the pelvis. IMPRESSION: Acute nondisplaced RIGHT femur intertrochanteric fracture. No dislocation. Electronically Signed   By: Elon Alas M.D.   On: 11/02/2016 19:19     Scheduled Meds: . atorvastatin  20 mg Oral QHS  . citalopram  10 mg Oral Daily  . cloNIDine  0.1 mg Transdermal Weekly  . docusate sodium  100 mg Oral BID  . enoxaparin (LOVENOX) injection  30 mg Subcutaneous Q24H  . feeding supplement (ENSURE ENLIVE)  237 mL Oral BID BM  . ferrous sulfate  325 mg Oral Q breakfast  . ferrous sulfate  325 mg Oral BID WC  . hydrALAZINE  50 mg Oral QID  . nortriptyline  25 mg Oral QHS  . pantoprazole  20 mg Oral Daily  . senna  1 tablet Oral BID  . sodium chloride  500 mL Intravenous Once   Continuous Infusions: . sodium chloride 50 mL/hr at 11/04/16 0035     LOS: 2 days   Time Spent in minutes   30 minutes  Jeremey Bascom D.O. on 11/04/2016 at 11:38 AM  Between 7am to 7pm - Pager - [phone]  After 7pm go to [email or web address] - password [email or web address]  And look for the night coverage person covering for me after hours  [facility] Office  [phone]

## 2016-11-04 NOTE — Plan of Care (Signed)
Problem: Safety: Goal: Ability to remain free from injury will improve Outcome: Progressing Safety precautions maintained  Problem: Tissue Perfusion: Goal: Risk factors for ineffective tissue perfusion will decrease Outcome: Progressing SCDs are on , patient is on Lovenox  Problem: Bowel/Gastric: Goal: Will not experience complications related to bowel motility Outcome: Progressing No bowel issues reported  Problem: Physical Regulation: Goal: Will remain free from infection Outcome: Progressing No s/s of infection noted Goal: Postoperative complications will be avoided or minimized Outcome: Progressing No post op complications noted  Problem: Self-Concept: Goal: Verbalizations of decreased anxiety will increase Outcome: Progressing Had severe anxiety earlier this shift, medicated with Ativan as ordered with full relief, resting in the bed with eyes closed at present time, no acute distress noted, will continue to monitor.  Problem: Pain Management: Goal: Pain level will decrease Outcome: Progressing Medicated once for pain with moderate relief relief

## 2016-11-05 LAB — BASIC METABOLIC PANEL
ANION GAP: 8 (ref 5–15)
BUN: 41 mg/dL — ABNORMAL HIGH (ref 6–20)
CALCIUM: 8.6 mg/dL — AB (ref 8.9–10.3)
CO2: 21 mmol/L — ABNORMAL LOW (ref 22–32)
Chloride: 112 mmol/L — ABNORMAL HIGH (ref 101–111)
Creatinine, Ser: 2.79 mg/dL — ABNORMAL HIGH (ref 0.44–1.00)
GFR calc Af Amer: 17 mL/min — ABNORMAL LOW (ref >60)
GFR, EST NON AFRICAN AMERICAN: 15 mL/min — AB (ref >60)
GLUCOSE: 118 mg/dL — AB (ref 65–99)
Potassium: 3.7 mmol/L (ref 3.5–5.1)
Sodium: 141 mmol/L (ref 135–145)

## 2016-11-05 LAB — CBC
HCT: 25.4 % — ABNORMAL LOW (ref 36.0–46.0)
Hemoglobin: 7.8 g/dL — ABNORMAL LOW (ref 12.0–15.0)
MCH: 26.9 pg (ref 26.0–34.0)
MCHC: 30.7 g/dL (ref 30.0–36.0)
MCV: 87.6 fL (ref 78.0–100.0)
PLATELETS: 94 10*3/uL — AB (ref 150–400)
RBC: 2.9 MIL/uL — ABNORMAL LOW (ref 3.87–5.11)
RDW: 15.2 % (ref 11.5–15.5)
WBC: 12.5 10*3/uL — AB (ref 4.0–10.5)

## 2016-11-05 MED ORDER — HYDROCODONE-ACETAMINOPHEN 5-325 MG PO TABS: 1.0000 | ORAL_TABLET | Freq: Four times a day (QID) | ORAL | 0 refills | Status: DC | PRN

## 2016-11-05 MED ORDER — HYDRALAZINE HCL 50 MG PO TABS
100.0000 mg | ORAL_TABLET | Freq: Four times a day (QID) | ORAL | Status: DC
Start: 2016-11-05 — End: 2016-11-06
  Administered 2016-11-05 – 2016-11-06 (×5): 100 mg via ORAL
  Filled 2016-11-05 (×5): qty 2

## 2016-11-05 MED ORDER — ENOXAPARIN SODIUM 30 MG/0.3ML ~~LOC~~ SOLN: 30.0000 mg | SUBCUTANEOUS | 0 refills | Status: DC

## 2016-11-05 NOTE — Progress Notes (Signed)
CSW left voicemail for son to return call to discuss bed offers once patient is medically stable for discharge.   Kingsley Spittle, [place] Clinical Social Worker [phone]

## 2016-11-05 NOTE — Plan of Care (Signed)
Problem: Pain Managment: Goal: General experience of comfort will improve Outcome: Progressing Denies pain  Problem: Physical Regulation: Goal: Will remain free from infection Outcome: Progressing No s/s of infection Goal: Postoperative complications will be avoided or minimized Outcome: Progressing No post op complications noted  Problem: Self-Concept: Goal: Verbalizations of decreased anxiety will increase Outcome: Progressing No anxiety noted tonight

## 2016-11-05 NOTE — Progress Notes (Signed)
PROGRESS NOTE    Cassie Peters  ZLD:[id number] DOB: 12/04/1933 DOA: 11/02/2016 PCP: [name], MD   Chief Complaint  Patient presents with  . Hip Pain     Brief Narrative:  HPI on 11/02/2016 by Dr. Lily Kocher Cassie Peters is a 83 y.o. woman with a history of CVA with residual right sided weakness (she ambulates with a cane at baseline), HTN, probable CKD 3 (available labs reviewed in our system; erratic trend, best creatinine was 1.29 in May 2013), thyroid disease, and recent episode of angioedema (possibly related to ACE-I; she did not come to medical attention) who presents to the ED via EMS for evaluation of acute hip pain after having a mechanical fall in her bathroom today.  She feels that she was in her baseline state of health before the falls.  She denies any preceding aura, headache, chest pain, shortness of breath, palpitations, or light-headedness.  She did not lose consciousness.  Her son was present in the home and her heard her go down.  He rushed to her attention immediately.  Patient was awake and alert, unable to get up of the ground, and complaining of pain.  No stigmata of seizure activity.  No recent antibiotics for any reason.  No unexplained chest pain, SOB, or lower extremity edema.  No lower urinary tract symptoms. Assessment & Plan   Acute nondisplaced right intertrochanteric femur fracture  -Orthopedics consulted and appreciated -S/p INTRAMEDULLARY (IM) NAIL INTERTROCHANTRIC (Right) -Continue pain control -PT and OT consulted and recommended SNF, however, patient wishes to go home.  Accelerated HTN  -secondary to acute pain and nausea/vomiting -Better controlled today -Avoid ACE-I, ARB due to renal insufficiency -Continue pain management -Continue hydralazine and clonidine patch, amlodipine -Will incraase hydralazine today and continue IV hydralazine  Nausea and vomiting -likely related to pain and pain medications -Seems to have  improved -Continue antiemetics PRN  AKI vs CKD, Stage III -Creatinine upon admission was 2.63 -Last creatinine was 1.29 in 2013 -Currently creatinine 2.79 -Will continue to monitor BMP -continue gentle IVF  Leukocytosis -Possible she has stress demargination and low grade fever due to fracture -Currently trending downward -repeat CBC in AM  Prior CVA -Continue statin, aspirin  Anemia  -Anemia panel suggests AOCD -B12 and folate wnl  -Continue iron supplementation  -Hemoglobin currently 7.8 -will continue to monitor CBC   Thrombocytopenia, chronic  -Continue to monitor CBC  Depression/Anxiety -Continue celexa and PRN Xanax  GI prophylaxis -PPI  DVT Prophylaxis  SCDs/Lovenox  Code Status: Full  Family Communication: None at bedside  Disposition Plan: Admitted, pending decision on SNF vs HH.  Continue to monitor CBC  Consultants Orthopedic surgery  Procedures  INTRAMEDULLARY (IM) NAIL INTERTROCHANTRIC (Right)  Antibiotics   Anti-infectives    Start     Dose/Rate Route Frequency Ordered Stop   11/03/16 2200  ceFAZolin (ANCEF) IVPB 1 g/50 mL premix     1 g 100 mL/hr over 30 Minutes Intravenous Every 6 hours 11/03/16 1847 11/04/16 0450   11/03/16 1700  ceFAZolin (ANCEF) IVPB 2g/100 mL premix     2 g 200 mL/hr over 30 Minutes Intravenous On call to O.R. 11/02/16 2123 11/03/16 1627   11/03/16 1000  ceFAZolin (ANCEF) IVPB 2g/100 mL premix  Status:  Discontinued     2 g 200 mL/hr over 30 Minutes Intravenous On call to O.R. 11/03/16 0950 11/03/16 1016      Subjective:   [name] seen and examined today.  Patient feels pain is  mildly better. Wishes to go home not to nursing facility. Denies nausea or vomiting. Denies chest pain, shortness of breath, abdominal pain, headache, dizziness.   Objective:   Vitals:   11/04/16 1656 11/04/16 1840 11/04/16 1924 11/05/16 0431  BP: (!) 192/57 (!) 173/53 (!) 188/72 (!) 189/72  Pulse: 88 87 92 92  Resp:   16  18  Temp:   98.7 F (37.1 C) 99.3 F (37.4 C)  TempSrc:   Oral Oral  SpO2:   94% 95%  Weight:      Height:        Intake/Output Summary (Last 24 hours) at 11/05/16 1040 Last data filed at 11/04/16 1700  Gross per 24 hour  Intake           790.83 ml  Output              100 ml  Net           690.83 ml   Filed Weights   11/02/16 2300 11/03/16 1144  Weight: 60.1 kg (132 lb 7.9 oz) 61.6 kg (135 lb 12.9 oz)    Exam  General: Well developed, well nourished, NAD, appears stated age  HEENT: NCAT,  mucous membranes moist.   Cardiovascular: S1 S2 auscultated, no rubs, murmurs or gallops. Regular rate and rhythm.  Respiratory: Clear to auscultation bilaterally with equal chest rise  Abdomen: Soft, nontender, nondistended, + bowel sounds  Extremities: warm dry without cyanosis clubbing or edema. RUE contracted  Neuro: AAOx3, right sided weakness- moreso in RUE  Psych: Appropriate mood and affect   Data Reviewed: I have personally reviewed following labs and imaging studies  CBC:  Recent Labs Lab 11/02/16 2006 11/03/16 0417 11/03/16 1952 11/04/16 0719 11/05/16 0400  WBC 16.3* 16.0* 19.2* 13.1* 12.5*  NEUTROABS 15.3* 13.4*  --   --   --   HGB 10.5* 9.9* 9.1* 8.3* 7.8*  HCT 32.2* 30.3* 29.4* 27.3* 25.4*  MCV 84.5 84.9 88.0 87.8 87.6  PLT 118* 120* 128* 108* 94*   Basic Metabolic Panel:  Recent Labs Lab 11/02/16 2006 11/03/16 0417 11/03/16 1952 11/04/16 0719 11/05/16 0400  NA 139 139  --  144 141  K 4.9 4.8  --  4.4 3.7  CL 109 110  --  116* 112*  CO2 20* 20*  --  20* 21*  GLUCOSE 145* 122*  --  116* 118*  BUN 48* 46*  --  45* 41*  CREATININE 2.63* 2.65* 2.79* 2.88* 2.79*  CALCIUM 8.7* 8.9  --  8.5* 8.6*   GFR: Estimated Creatinine Clearance: 14.5 mL/min (by C-G formula based on SCr of 2.79 mg/dL (H)). Liver Function Tests:  Recent Labs Lab 11/03/16 0417  AST 20  ALT 14  ALKPHOS 73  BILITOT 0.5  PROT 7.0  ALBUMIN 3.8   No results for  input(s): LIPASE, AMYLASE in the last 168 hours. No results for input(s): AMMONIA in the last 168 hours. Coagulation Profile:  Recent Labs Lab 11/02/16 2135  INR 1.07   Cardiac Enzymes: No results for input(s): CKTOTAL, CKMB, CKMBINDEX, TROPONINI in the last 168 hours. BNP (last 3 results) No results for input(s): PROBNP in the last 8760 hours. HbA1C: No results for input(s): HGBA1C in the last 72 hours. CBG: No results for input(s): GLUCAP in the last 168 hours. Lipid Profile: No results for input(s): CHOL, HDL, LDLCALC, TRIG, CHOLHDL, LDLDIRECT in the last 72 hours. Thyroid Function Tests:  Recent Labs  11/02/16 2343  TSH 0.075*  Anemia Panel:  Recent Labs  11/02/16 2343  VITAMINB12 500  FOLATE 8.8  FERRITIN 534*  TIBC 230*  IRON 14*  RETICCTPCT 1.1   Urine analysis:    Component Value Date/Time   COLORURINE YELLOW 11/03/2016 0158   APPEARANCEUR CLEAR 11/03/2016 0158   LABSPEC 1.012 11/03/2016 0158   PHURINE 5.0 11/03/2016 0158   GLUCOSEU NEGATIVE 11/03/2016 0158   HGBUR NEGATIVE 11/03/2016 0158   BILIRUBINUR NEGATIVE 11/03/2016 0158   KETONESUR NEGATIVE 11/03/2016 0158   PROTEINUR 100 (A) 11/03/2016 0158   UROBILINOGEN 0.2 01/01/2012 1745   NITRITE NEGATIVE 11/03/2016 0158   LEUKOCYTESUR NEGATIVE 11/03/2016 0158   Sepsis Labs: @LABRCNTIP (procalcitonin:4,lacticidven:4)  ) Recent Results (from the past 240 hour(s))  Culture, Urine     Status: Abnormal   Collection Time: 11/02/16  1:58 AM  Result Value Ref Range Status   Specimen Description URINE, RANDOM  Final   Special Requests NONE  Final   Culture MULTIPLE SPECIES PRESENT, SUGGEST RECOLLECTION (A)  Final   Report Status 11/04/2016 FINAL  Final  Surgical PCR screen     Status: None   Collection Time: 11/03/16 12:09 AM  Result Value Ref Range Status   MRSA, PCR NEGATIVE NEGATIVE Final   Staphylococcus aureus NEGATIVE NEGATIVE Final    Comment:        The Xpert SA Assay (FDA approved for  NASAL specimens in patients over 63 years of age), is one component of a comprehensive surveillance program.  Test performance has been validated by [facility] for patients greater than or equal to 9 year old. It is not intended to diagnose infection nor to guide or monitor treatment.       Radiology Studies: Dg C-arm 1-60 Min  Result Date: 11/03/2016 CLINICAL DATA:  Right hip fracture repair EXAM: OPERATIVE RIGHT HIP (WITH PELVIS IF PERFORMED)  VIEWS TECHNIQUE: Fluoroscopic spot image(s) were submitted for interpretation post-operatively. Fluoroscopic time: 64 seconds. Images: 2 COMPARISON:  None. FINDINGS: An intramedullary rod and nail have been placed across the intertrochanteric fracture. No dislocation. The inferior trochanter remains displaced. IMPRESSION: Fracture repair as above Electronically Signed   By: Dorise Bullion III M.D   On: 11/03/2016 17:50   Dg Hip Operative Unilat W Or W/o Pelvis Right  Result Date: 11/03/2016 CLINICAL DATA:  Right hip fracture repair EXAM: OPERATIVE RIGHT HIP (WITH PELVIS IF PERFORMED)  VIEWS TECHNIQUE: Fluoroscopic spot image(s) were submitted for interpretation post-operatively. Fluoroscopic time: 64 seconds. Images: 2 COMPARISON:  None. FINDINGS: An intramedullary rod and nail have been placed across the intertrochanteric fracture. No dislocation. The inferior trochanter remains displaced. IMPRESSION: Fracture repair as above Electronically Signed   By: Dorise Bullion III M.D   On: 11/03/2016 17:50     Scheduled Meds: . amLODipine  10 mg Oral Daily  . aspirin  325 mg Oral Daily  . atorvastatin  20 mg Oral QHS  . citalopram  10 mg Oral Daily  . cloNIDine  0.1 mg Transdermal Weekly  . docusate sodium  100 mg Oral BID  . enoxaparin (LOVENOX) injection  30 mg Subcutaneous Q24H  . feeding supplement (ENSURE ENLIVE)  237 mL Oral BID BM  . ferrous sulfate  325 mg Oral BID WC  . hydrALAZINE  50 mg Oral QID  . nortriptyline  25 mg Oral QHS  .  pantoprazole  20 mg Oral Daily  . senna  1 tablet Oral BID  . sodium chloride  500 mL Intravenous Once   Continuous Infusions: . sodium  chloride 50 mL/hr at 11/04/16 0035     LOS: 3 days   Time Spent in minutes   30 minutes  Noemi Ishmael D.O. on 11/05/2016 at 10:40 AM  Between 7am to 7pm - Pager - [phone]  After 7pm go to [email or web address] - password [email or web address]  And look for the night coverage person covering for me after hours  [facility] Office  [phone]

## 2016-11-05 NOTE — Progress Notes (Signed)
Patient ID: Cassie Peters, female   DOB: 12/04/1933, 83 y.o.   MRN: [id number] Subjective: 2 Days Post-Op Procedure(s) (LRB): INTRAMEDULLARY (IM) NAIL INTERTROCHANTRIC (Right)    Patient reports pain as mild.  Disposition concerns at this point as patient wants to go home versus current PT recs based on assistance required  Objective:   VITALS:   Vitals:   11/04/16 1924 11/05/16 0431  BP: (!) 188/72 (!) 189/72  Pulse: 92 92  Resp: 16 18  Temp: 98.7 F (37.1 C) 99.3 F (37.4 C)    Neurovascular intact Incision: dressing C/D/I, right hip  LABS  Recent Labs  11/03/16 1952 11/04/16 0719 11/05/16 0400  HGB 9.1* 8.3* 7.8*  HCT 29.4* 27.3* 25.4*  WBC 19.2* 13.1* 12.5*  PLT 128* 108* 94*     Recent Labs  11/03/16 0417 11/03/16 1952 11/04/16 0719 11/05/16 0400  NA 139  --  144 141  K 4.8  --  4.4 3.7  BUN 46*  --  45* 41*  CREATININE 2.65* 2.79* 2.88* 2.79*  GLUCOSE 122*  --  116* 118*     Recent Labs  11/02/16 2135  INR 1.07     Assessment/Plan: 2 Days Post-Op Procedure(s) (LRB): INTRAMEDULLARY (IM) NAIL INTERTROCHANTRIC (Right)   Up with therapy Plan for discharge tomorrow - location pending PT, SW, and family involvement with decision process RTC in 2 weeks WBAT Aspirin for DVT prophylaxis due to fall risk

## 2016-11-06 ENCOUNTER — Encounter (HOSPITAL_COMMUNITY): Payer: Self-pay | Admitting: Orthopedic Surgery

## 2016-11-06 LAB — BASIC METABOLIC PANEL
Anion gap: 12 (ref 5–15)
BUN: 38 mg/dL — ABNORMAL HIGH (ref 6–20)
CALCIUM: 8.6 mg/dL — AB (ref 8.9–10.3)
CO2: 20 mmol/L — ABNORMAL LOW (ref 22–32)
CREATININE: 2.57 mg/dL — AB (ref 0.44–1.00)
Chloride: 109 mmol/L (ref 101–111)
GFR calc Af Amer: 19 mL/min — ABNORMAL LOW (ref >60)
GFR, EST NON AFRICAN AMERICAN: 17 mL/min — AB (ref >60)
GLUCOSE: 113 mg/dL — AB (ref 65–99)
Potassium: 3.8 mmol/L (ref 3.5–5.1)
Sodium: 141 mmol/L (ref 135–145)

## 2016-11-06 LAB — CBC
HEMATOCRIT: 24.3 % — AB (ref 36.0–46.0)
HEMOGLOBIN: 7.8 g/dL — AB (ref 12.0–15.0)
MCH: 27.7 pg (ref 26.0–34.0)
MCHC: 32.1 g/dL (ref 30.0–36.0)
MCV: 86.2 fL (ref 78.0–100.0)
Platelets: 92 10*3/uL — ABNORMAL LOW (ref 150–400)
RBC: 2.82 MIL/uL — AB (ref 3.87–5.11)
RDW: 15.1 % (ref 11.5–15.5)
WBC: 14.6 10*3/uL — ABNORMAL HIGH (ref 4.0–10.5)

## 2016-11-06 MED ORDER — POLYETHYLENE GLYCOL 3350 17 G PO PACK: 17.0000 g | PACK | Freq: Every day | ORAL | 0 refills | Status: AC | PRN

## 2016-11-06 MED ORDER — NORTRIPTYLINE HCL 25 MG PO CAPS: 25.0000 mg | ORAL_CAPSULE | Freq: Every day | ORAL | Status: AC

## 2016-11-06 MED ORDER — ALPRAZOLAM 0.5 MG PO TABS: 0.5000 mg | ORAL_TABLET | Freq: Every evening | ORAL | 0 refills | Status: DC | PRN

## 2016-11-06 MED ORDER — DOCUSATE SODIUM 100 MG PO CAPS: 100.0000 mg | ORAL_CAPSULE | Freq: Two times a day (BID) | ORAL | 0 refills | Status: AC

## 2016-11-06 MED ORDER — ENSURE ENLIVE PO LIQD: 237.0000 mL | Freq: Two times a day (BID) | ORAL | 12 refills | Status: DC

## 2016-11-06 MED ORDER — HYDRALAZINE HCL 100 MG PO TABS: 100.0000 mg | ORAL_TABLET | Freq: Four times a day (QID) | ORAL | 0 refills | Status: DC

## 2016-11-06 NOTE — Progress Notes (Signed)
[employer] auth received for pt transfer to [facility] today- #[id number] [facility]  Patient will discharge to [facility] Anticipated discharge date: 3/12 Family notified: Percell Miller (son) Transportation by [facility]- scheduled for 2:30pm  CSW signing off.  Jorge Ny, LCSW Clinical Social Worker [phone]

## 2016-11-06 NOTE — Discharge Summary (Signed)
Physician Discharge Summary  Cassie Peters LZJ:[id number] DOB: 12/04/1933 DOA: 11/02/2016  PCP: [name], MD  Admit date: 11/02/2016 Discharge date: 11/06/2016  Time spent: 45 minutes  Recommendations for Outpatient Follow-up:  Patient will be discharged to skilled nursing facility.  Patient will need to follow up with primary care provider within one week of discharge, repeat CBC and BMP.  Patient should continue medications as prescribed.  Patient should follow a heart healthy diet.   Discharge Diagnoses:  Acute nondisplaced right intertrochanteric femur fracture  Accelerated HTN  Nausea and vomiting AKI vs CKD, Stage III Leukocytosis Prior CVA Anemia  Thrombocytopenia, chronic  Depression/Anxiety  Discharge Condition: Stable  Diet recommendation: heart healthy  Filed Weights   11/02/16 2300 11/03/16 1144  Weight: 60.1 kg (132 lb 7.9 oz) 61.6 kg (135 lb 12.9 oz)    History of present illness:  on 11/02/2016 by Dr. Lily Kocher Alcus Dad a 83 y.o.woman with a history of CVA with residual right sided weakness (she ambulates with a cane at baseline), HTN, probable CKD 3 (available labs reviewed in our system; erratic trend, best creatinine was 1.29 in May 2013), thyroid disease, and recent episode of angioedema (possibly related to ACE-I; she did not come to medical attention) who presents to the ED via EMS for evaluation of acute hip pain after having a mechanical fall in her bathroom today. She feels that she was in her baseline state of health before the falls. She denies any preceding aura, headache, chest pain, shortness of breath, palpitations, or light-headedness. She did not lose consciousness. Her son was present in the home and her heard her go down. He rushed to her attention immediately. Patient was awake and alert, unable to get up of the ground, and complaining of pain. No stigmata of seizure activity.  No recent antibiotics for any reason. No  unexplained chest pain, SOB, or lower extremity edema. No lower urinary tract symptoms  Hospital Course:  Acute nondisplaced right intertrochanteric femur fracture  -Orthopedics consulted and appreciated -S/p INTRAMEDULLARY (IM) NAIL INTERTROCHANTRIC (Right) -Continue pain control -PT and OT consulted and recommended SNF  Accelerated HTN  -secondary to acute pain and nausea/vomiting -Avoid ACE-I, ARB due to renal insufficiency -Continue pain management -Continue hydralazine and clonidine patch, amlodipine -increased hydralazine  Nausea and vomiting -likely related to pain and pain medications -Seems to have improved -Continue antiemetics PRN  AKI vs CKD, Stage III -Creatinine upon admission was 2.63 -Last creatinine was 1.29 in 2013 -Currently creatinine 2.57 -repeat BMP in one week  Leukocytosis -Possible she has stress demargination and low grade fever due to fracture -Currently trending downward  Prior CVA -Continue statin, aspirin  Anemia  -Anemia panel suggests AOCD -B12 and folate wnl -Continue iron supplementation  -Hemoglobin currently 7.8- stable -Repeat CBC in one week  Thrombocytopenia, chronic  -Repeat CBC in one week  Depression/Anxiety -Continue celexa and PRN Xanax  Consultants Orthopedic surgery  Procedures  INTRAMEDULLARY (IM) NAIL INTERTROCHANTRIC (Right)  Discharge Exam: Vitals:   11/06/16 1300 11/06/16 1455  BP: (!) 185/51 (!) 152/54  Pulse:  86  Resp:  17  Temp:  98.6 F (37 C)     General: Well developed, well nourished, NAD, appears stated age  HEENT: NCAT, PERRLA, EOMI, Anicteic Sclera, mucous membranes moist.  Neck: Supple, no JVD, no masses  Cardiovascular: S1 S2 auscultated, no rubs, murmurs or gallops. Regular rate and rhythm.  Respiratory: Clear to auscultation bilaterally with equal chest rise  Abdomen: Soft, nontender, nondistended, +  bowel sounds  Extremities: warm dry without cyanosis clubbing or  edema  Neuro: AAOx3, cranial nerves grossly intact. Strength 5/5 in patient's upper and lower extremities bilaterally  Skin: Without rashes exudates or nodules  Psych: Normal affect and demeanor with intact judgement and insight  Discharge Instructions Discharge Instructions    Discharge instructions    Complete by:  As directed    Patient will be discharged to skilled nursing facility.  Patient will need to follow up with primary care provider within one week of discharge, repeat CBC and BMP.  Patient should continue medications as prescribed.  Patient should follow a heart healthy diet.   Weight bearing as tolerated    Complete by:  As directed      Current Discharge Medication List    START taking these medications   Details  docusate sodium (COLACE) 100 MG capsule Take 1 capsule (100 mg total) by mouth 2 (two) times daily. Qty: 10 capsule, Refills: 0    enoxaparin (LOVENOX) 30 MG/0.3ML injection Inject 0.3 mLs (30 mg total) into the skin daily. Qty: 10 Syringe, Refills: 0    feeding supplement, ENSURE ENLIVE, (ENSURE ENLIVE) LIQD Take 237 mLs by mouth 2 (two) times daily between meals. Qty: 237 mL, Refills: 12    HYDROcodone-acetaminophen (NORCO/VICODIN) 5-325 MG tablet Take 1-2 tablets by mouth every 6 (six) hours as needed for moderate pain. Qty: 50 tablet, Refills: 0    polyethylene glycol (MIRALAX / GLYCOLAX) packet Take 17 g by mouth daily as needed for mild constipation. Qty: 14 each, Refills: 0      CONTINUE these medications which have CHANGED   Details  ALPRAZolam (XANAX) 0.5 MG tablet Take 1-2 tablets (0.5-1 mg total) by mouth at bedtime as needed. For anxiety/sleep. Qty: 15 tablet, Refills: 0    hydrALAZINE (APRESOLINE) 100 MG tablet Take 1 tablet (100 mg total) by mouth 4 (four) times daily. Qty: 120 tablet, Refills: 0    nortriptyline (PAMELOR) 25 MG capsule Take 1 capsule (25 mg total) by mouth at bedtime.      CONTINUE these medications which have  NOT CHANGED   Details  amLODipine (NORVASC) 10 MG tablet Take 10 mg by mouth daily.    aspirin 325 MG tablet Take 325 mg by mouth daily.    atorvastatin (LIPITOR) 20 MG tablet Take 20 mg by mouth at bedtime.    cholecalciferol (VITAMIN D) 1000 units tablet Take 1,000 Units by mouth daily.    citalopram (CELEXA) 10 MG tablet Take 10 mg by mouth daily.    cloNIDine (CATAPRES - DOSED IN MG/24 HR) 0.1 mg/24hr patch Place 1 patch onto the skin once a week. Applies on Fridays.    ferrous sulfate 325 (65 FE) MG tablet Take 325 mg by mouth daily with breakfast.    pantoprazole (PROTONIX) 40 MG tablet Take 40 mg by mouth daily as needed. For indigestion.    senna (SENOKOT) 8.6 MG TABS Take 1 tablet by mouth daily as needed. For constipation.      STOP taking these medications     lisinopril (PRINIVIL,ZESTRIL) 40 MG tablet      famotidine (PEPCID) 20 MG tablet        Allergies  Allergen Reactions  . Cardura [Doxazosin Mesylate]     Allergic to pill form  . Clonidine Derivatives     Allergic to pill form  . Prinzide [Lisinopril-Hydrochlorothiazide]     Allergic to pill form    Contact information for follow-up providers  Mauri Pole, MD Follow up in 2 week(s).   Specialty:  Orthopedic Surgery Why:  for wound check, staple removal and X-rays Contact information: [address] [place] [address] [phone]        Mauri Pole, MD Follow up.   Specialty:  Orthopedic Surgery Contact information: [address] [place] [place] [address]        [name], MD. Schedule an appointment as soon as possible for a visit in 1 week(s).   Specialty:  Family Medicine Why:  Hospital follow up Contact information: [place] [address] [phone]            Contact information for after-discharge care    Destination    [facility] Follow up.   Specialty:  [place] information: [address] [place] [phone]                   The results of significant diagnostics from this hospitalization (including imaging, microbiology, ancillary and laboratory) are listed below for reference.    Significant Diagnostic Studies: Dg Chest 1 View  Result Date: 11/02/2016 CLINICAL DATA:  Preop for right hip fracture fixation. EXAM: CHEST 1 VIEW COMPARISON:  06/16/2015 FINDINGS: The heart is within normal limits in size and stable. There is moderate tortuosity and calcification of the thoracic aorta. The lungs are clear of acute process. No pleural effusion. The bony thorax is intact. IMPRESSION: No acute cardiopulmonary findings. Electronically Signed   By: Marijo Sanes M.D.   On: 11/02/2016 20:01   Dg Knee Right Port  Result Date: 11/02/2016 CLINICAL DATA:  [place] in bathroom this morning. Known RIGHT hip fracture. EXAM: PORTABLE RIGHT KNEE - 1-2 VIEW COMPARISON:  None. FINDINGS: No evidence of fracture, dislocation, or joint effusion. No evidence of arthropathy or other focal bone abnormality. Mild meniscal calcifications. Moderate vascular calcifications. IMPRESSION: No acute fracture deformity or dislocation. Electronically Signed   By: Elon Alas M.D.   On: 11/02/2016 21:50   Dg C-arm 1-60 Min  Result Date: 11/03/2016 CLINICAL DATA:  Right hip fracture repair EXAM: OPERATIVE RIGHT HIP (WITH PELVIS IF PERFORMED)  VIEWS TECHNIQUE: Fluoroscopic spot image(s) were submitted for interpretation post-operatively. Fluoroscopic time: 64 seconds. Images: 2 COMPARISON:  None. FINDINGS: An intramedullary rod and nail have been placed across the intertrochanteric fracture. No dislocation. The inferior trochanter remains displaced. IMPRESSION: Fracture repair as above Electronically Signed   By: Dorise Bullion III M.D   On: 11/03/2016 17:50   Dg Hip Operative Unilat W Or W/o Pelvis Right  Result Date:  11/03/2016 CLINICAL DATA:  Right hip fracture repair EXAM: OPERATIVE RIGHT HIP (WITH PELVIS IF PERFORMED)  VIEWS TECHNIQUE: Fluoroscopic spot image(s) were submitted for interpretation post-operatively. Fluoroscopic time: 64 seconds. Images: 2 COMPARISON:  None. FINDINGS: An intramedullary rod and nail have been placed across the intertrochanteric fracture. No dislocation. The inferior trochanter remains displaced. IMPRESSION: Fracture repair as above Electronically Signed   By: Dorise Bullion III M.D   On: 11/03/2016 17:50   Dg Hip Unilat W Or Wo Pelvis 2-3 Views Right  Result Date: 11/02/2016 CLINICAL DATA:  [place] in bathroom today at 1100 hours. Subsequent RIGHT hip pain. EXAM: DG HIP (WITH OR WITHOUT PELVIS) 2-3V RIGHT COMPARISON:  None. FINDINGS: Acute RIGHT femur intertrochanteric fracture in alignment. Avulsion of the RIGHT lesser trochanter. No dislocation. No destructive bony lesions. Mild bilateral hip osteoarthrosis. Moderate degenerative change  of the included lumbar spine. Moderate vascular calcifications. Phleboliths project in the pelvis. IMPRESSION: Acute nondisplaced RIGHT femur intertrochanteric fracture. No dislocation. Electronically Signed   By: Elon Alas M.D.   On: 11/02/2016 19:19    Microbiology: Recent Results (from the past 240 hour(s))  Culture, Urine     Status: Abnormal   Collection Time: 11/02/16  1:58 AM  Result Value Ref Range Status   Specimen Description URINE, RANDOM  Final   Special Requests NONE  Final   Culture MULTIPLE SPECIES PRESENT, SUGGEST RECOLLECTION (A)  Final   Report Status 11/04/2016 FINAL  Final  Surgical PCR screen     Status: None   Collection Time: 11/03/16 12:09 AM  Result Value Ref Range Status   MRSA, PCR NEGATIVE NEGATIVE Final   Staphylococcus aureus NEGATIVE NEGATIVE Final    Comment:        The Xpert SA Assay (FDA approved for NASAL specimens in patients over 37 years of age), is one component of a comprehensive  surveillance program.  Test performance has been validated by [facility] for patients greater than or equal to 68 year old. It is not intended to diagnose infection nor to guide or monitor treatment.      Labs: Basic Metabolic Panel:  Recent Labs Lab 11/02/16 2006 11/03/16 0417 11/03/16 1952 11/04/16 0719 11/05/16 0400 11/06/16 0500  NA 139 139  --  144 141 141  K 4.9 4.8  --  4.4 3.7 3.8  CL 109 110  --  116* 112* 109  CO2 20* 20*  --  20* 21* 20*  GLUCOSE 145* 122*  --  116* 118* 113*  BUN 48* 46*  --  45* 41* 38*  CREATININE 2.63* 2.65* 2.79* 2.88* 2.79* 2.57*  CALCIUM 8.7* 8.9  --  8.5* 8.6* 8.6*   Liver Function Tests:  Recent Labs Lab 11/03/16 0417  AST 20  ALT 14  ALKPHOS 73  BILITOT 0.5  PROT 7.0  ALBUMIN 3.8   No results for input(s): LIPASE, AMYLASE in the last 168 hours. No results for input(s): AMMONIA in the last 168 hours. CBC:  Recent Labs Lab 11/02/16 2006 11/03/16 0417 11/03/16 1952 11/04/16 0719 11/05/16 0400 11/06/16 0500  WBC 16.3* 16.0* 19.2* 13.1* 12.5* 14.6*  NEUTROABS 15.3* 13.4*  --   --   --   --   HGB 10.5* 9.9* 9.1* 8.3* 7.8* 7.8*  HCT 32.2* 30.3* 29.4* 27.3* 25.4* 24.3*  MCV 84.5 84.9 88.0 87.8 87.6 86.2  PLT 118* 120* 128* 108* 94* 92*   Cardiac Enzymes: No results for input(s): CKTOTAL, CKMB, CKMBINDEX, TROPONINI in the last 168 hours. BNP: BNP (last 3 results) No results for input(s): BNP in the last 8760 hours.  ProBNP (last 3 results) No results for input(s): PROBNP in the last 8760 hours.  CBG: No results for input(s): GLUCAP in the last 168 hours.     SignedALINA, GILKEY  [facility] 11/06/2016, 2:58 PM

## 2016-11-06 NOTE — Progress Notes (Signed)
Patient ID: Cassie Peters, female   DOB: 12/04/1933, 83 y.o.   MRN: [id number] Subjective: 3 Days Post-Op Procedure(s) (LRB): INTRAMEDULLARY (IM) NAIL INTERTROCHANTRIC (Right)    Patient reports pain as moderate.  Stable over all  Objective:   VITALS:   Vitals:   11/05/16 2118 11/06/16 0426  BP: (!) 174/60 (!) 181/63  Pulse: 85 85  Resp: 16 18  Temp: 99.2 F (37.3 C) 98.5 F (36.9 C)    Neurovascular intact Incision: dressing C/D/I  LABS  Recent Labs  11/04/16 0719 11/05/16 0400 11/06/16 0500  HGB 8.3* 7.8* 7.8*  HCT 27.3* 25.4* 24.3*  WBC 13.1* 12.5* 14.6*  PLT 108* 94* 92*     Recent Labs  11/04/16 0719 11/05/16 0400 11/06/16 0500  NA 144 141 141  K 4.4 3.7 3.8  BUN 45* 41* 38*  CREATININE 2.88* 2.79* 2.57*  GLUCOSE 116* 118* 113*    No results for input(s): LABPT, INR in the last 72 hours.   Assessment/Plan: 3 Days Post-Op Procedure(s) (LRB): INTRAMEDULLARY (IM) NAIL INTERTROCHANTRIC (Right)   Up with therapy Discharge home with home health versus SNF based on PT recs, family discussion on help (as is patient's desire to be at home) RTC in 2 weeks

## 2016-11-06 NOTE — Op Note (Signed)
NAMEMarland Kitchen  Cassie Peters, Cassie Peters NO.:  [id number]  MEDICAL RECORD NO.:  [id number]  LOCATION:                                 FACILITY:  PHYSICIAN:  Pietro Cassis. Alvan Dame, M.D.  DATE OF BIRTH:  12/04/1933  DATE OF PROCEDURE:  11/03/2016 DATE OF DISCHARGE:                              OPERATIVE REPORT   PREOPERATIVE DIAGNOSIS:  Right intertrochanteric femur fracture.  POSTOPERATIVE DIAGNOSIS:  Right intertrochanteric femur fracture.  PROCEDURE:  Open reduction and internal fixation with an intramedullary nail hip screw for right proximal femur fracture utilizing a [employer] Affixus nail 11 x 180 mm, 130 degree lag screw measuring 100 mm with a distal interlock.  SURGEON:  Pietro Cassis. Alvan Dame, M.D.  ASSISTANT:  Surgical team.  ANESTHESIA:  General.  SPECIMENS:  None.  COMPLICATION:  None.  BLOOD LOSS:  Probably less than 100 mL.  INDICATIONS FOR PROCEDURE:  The patient is an 83 year old female who lives independently with her son.  She unfortunately ground level fall at her home, landing on her right side.  Upon evaluation, she had pain on the right hip very predominantly.  Workup revealed intertrochanteric femur fracture.  She was admitted to [facility].  She was seen and evaluated.  Indications for the procedure reviewed, risks of nonunion, malunion, need for future surgery were discussed and reviewed.  Consent was obtained after reviewing other risks of infection and DVT.  Due to the timing of surgery, she was actually transferred to [facility] to get it done to facilitate the time of operation.  PROCEDURE IN DETAIL:  The patient was brought to the operative theater. Once adequate anesthesia, preoperative antibiotics, Ancef administered. She was positioned supine on the fracture table.  Her right foot was placed in traction boot.  The left leg was flexed and abducted out of the way with bony prominences padded, particularly the peroneal nerve laterally.  A  padded peroneal post was placed.  Once adequately and safely positioned, traction internal rotation was applied to the right femur.  Fluoroscopy was utilized to confirm a near anatomic reduction given the significant comminution of the lesser trochanter as well as the medial calcar.  This was confirmed in AP and lateral planes.  At this point, the right hip was prepped and draped in sterile fashion using shower curtain technique.  Fluoroscopy was brought back to the field identifying the tip of the trochanter.  Incision was then made proximal to the tip of the trochanter.  Soft tissue dissection was carried down to the gluteal fascia which was incised.  A guidewire was inserted into the tip of the trochanter and confirmed that it had bypassed the fracture.  Starting proximal femoral drill was used to open up the proximal femur.  An 11 x 180 mm nail was then passed by hand to its appropriate depth.  Once in position, the guidewire was inserted into the center of the femoral head in AP and lateral planes, slightly posterior, slightly inferior and once confirmed radiographically, measured the depth and selected a 100 mm lag screw.  I then drilled for the lag screw over the guidewire.  The 100 mm lag screw was  then passed.  Once it was in appropriate position, I did use the compression wheel and medialized the shaft to the fracture.  This was done with traction off the leg.  Once this was done, the 34 mm distal interlock screw was placed through the insertion jig.  The insertion jig was removed.  All wounds were irrigated with normal saline solution.  The proximal gluteal fascia was reapproximated using #1 Vicryl.  The remainder of the wounds was closed with 2-0 Vicryl and staples on the skin.  The hip skin was then cleaned, dried, and dressed sterilely using Mepilex dressing.  She was then extubated and brought to the recovery room in stable condition tolerating the procedure  well. Findings were reviewed with the family.  She will be weightbearing as tolerated.  She will be on aspirin for DVT prophylaxis.  We will see her back in the office in 2 weeks for clinical and radiographic followup.     Pietro Cassis Alvan Dame, M.D.   ______________________________ Pietro Cassis. Alvan Dame, M.D.    MDO/MEDQ  D:  11/03/2016  T:  11/03/2016  Job:  [id number]

## 2016-11-06 NOTE — Progress Notes (Signed)
Pt and son agreeable to SNF placement at this time- choose [facility]  CSW has submitted clinicals to [facility] for approval- awaiting response  Jorge Ny, [place] Social Worker [phone]

## 2016-11-06 NOTE — Clinical Social Work Placement (Signed)
   CLINICAL SOCIAL WORK PLACEMENT  NOTE  Date:  11/06/2016  Patient Details  Name: Cassie Peters MRN: [id number] Date of Birth: 12/04/1933  Clinical Social Work is seeking post-discharge placement for this patient at the [place] level of care (*CSW will initial, date and re-position this form in  chart as items are completed):      Patient/family provided with [facility] Work Department's list of facilities offering this level of care within the geographic area requested by the patient (or if unable, by the patient's family).  Yes   Patient/family informed of their freedom to choose among providers that offer the needed level of care, that participate in Medicare, Medicaid or managed care program needed by the patient, have an available bed and are willing to accept the patient.  Yes   Patient/family informed of [facility] ownership interest in [facility] and [facility], as well as of the fact that they are under no obligation to receive care at these facilities.  PASRR submitted to EDS on 11/03/16     PASRR number received on 11/03/16     Existing PASRR number confirmed on       FL2 transmitted to all facilities in geographic area requested by pt/family on 11/03/16     FL2 transmitted to all facilities within larger geographic area on       Patient informed that his/her managed care company has contracts with or will negotiate with certain facilities, including the following:        Yes   Patient/family informed of bed offers received.  Patient chooses bed at [facility]     Physician recommends and patient chooses bed at      Patient to be transferred to [facility] on 11/06/16.  Patient to be transferred to facility by ptar     Patient family notified on 11/06/16 of transfer.  Name of family member notified:  Percell Miller     PHYSICIAN Please sign FL2     Additional Comment:     _______________________________________________ Jorge Ny, LCSW 11/06/2016, 1:28 PM

## 2016-11-06 NOTE — Progress Notes (Signed)
Physical Therapy Treatment Patient Details Name: Cassie Peters MRN: [id number] DOB: 12/04/1933 Today's Date: 11/06/2016    History of Present Illness Pt is an 83 yo female admitted on 11/02/16 following a fall resulting in a right hip fracture. Pt underwent an IM nail on 11/03/16. PMH significant for CVA affecting right side 2011, HTN, CKD3. Thyroid disease, angiodema from medications.     PT Comments    Pt just returned to bed with nursing staff, but agreeable to sit EOB and did 2 semi-stands.  Pt fearful of moving due to fear of increase in pain.  Her mobility is impacted by hx of CVA and decreased use of R UE. Continue to recommend SNF.   Follow Up Recommendations  SNF;Supervision/Assistance - 24 hour     Equipment Recommendations  None recommended by PT    Recommendations for Other Services       Precautions / Restrictions Precautions Precautions: Fall Restrictions RLE Weight Bearing: Weight bearing as tolerated    Mobility  Bed Mobility Overal bed mobility: Needs Assistance Bed Mobility: Supine to Sit;Sit to Supine     Supine to sit: Mod assist;+2 for physical assistance Sit to supine: Max assist;+2 for physical assistance   General bed mobility comments: Assist for LEs back to bed. pt very guarded due to fear of pain.  Transfers Overall transfer level: Needs assistance Equipment used: 1 person hand held assist Transfers: Sit to/from Stand Sit to Stand: Max assist         General transfer comment: semi-stand at EOB and scooted to [facility] and then semi-stand again and repositioned pads.    Ambulation/Gait                 Stairs            Wheelchair Mobility    Modified Rankin (Stroke Patients Only)       Balance                                    Cognition Arousal/Alertness: Awake/alert Behavior During Therapy: WFL for tasks assessed/performed Overall Cognitive Status: Within Functional Limits for tasks assessed                       Exercises      General Comments        Pertinent Vitals/Pain Pain Assessment: Faces Faces Pain Scale: Hurts little more Pain Location: R hip Pain Descriptors / Indicators: Discomfort;Grimacing;Guarding Pain Intervention(s): Limited activity within patient's tolerance;Monitored during session;Repositioned    Home Living                      Prior Function            PT Goals (current goals can now be found in the care plan section) Acute Rehab PT Goals Patient Stated Goal: to go home.  PT Goal Formulation: With patient/family Time For Goal Achievement: 11/11/16 Potential to Achieve Goals: Fair Progress towards PT goals: Progressing toward goals    Frequency    Min 3X/week      PT Plan Current plan remains appropriate    Co-evaluation             End of Session Equipment Utilized During Treatment: Gait belt Activity Tolerance: Patient limited by pain Patient left: in bed;with call bell/phone within reach;with bed alarm set;with family/visitor present Nurse Communication: Mobility status PT Visit Diagnosis:  History of falling (Z91.81);Pain Pain - Right/Left: Right Pain - part of body: Hip     Time: 7588-3254 PT Time Calculation (min) (ACUTE ONLY): 12 min  Charges:  $Therapeutic Activity: 8-22 mins                    G Codes:       Cassie Peters 11/06/2016, 12:00 PM

## 2016-11-06 NOTE — Care Management Important Message (Signed)
Important Message  Patient Details  Name: Cassie Peters MRN: [id number] Date of Birth: 12/04/1933   Medicare Important Message Given:  Yes    Lucill Mauck [address] 11/06/2016, 12:21 PM

## 2016-11-09 ENCOUNTER — Non-Acute Institutional Stay (SKILLED_NURSING_FACILITY): Payer: Medicare Other | Admitting: Internal Medicine

## 2016-11-09 ENCOUNTER — Encounter: Payer: Self-pay | Admitting: Internal Medicine

## 2016-11-09 DIAGNOSIS — F329 Major depressive disorder, single episode, unspecified: Secondary | ICD-10-CM

## 2016-11-09 DIAGNOSIS — D696 Thrombocytopenia, unspecified: Secondary | ICD-10-CM

## 2016-11-09 DIAGNOSIS — R2681 Unsteadiness on feet: Secondary | ICD-10-CM | POA: Diagnosis not present

## 2016-11-09 DIAGNOSIS — S72144S Nondisplaced intertrochanteric fracture of right femur, sequela: Secondary | ICD-10-CM

## 2016-11-09 DIAGNOSIS — I6930 Unspecified sequelae of cerebral infarction: Secondary | ICD-10-CM

## 2016-11-09 DIAGNOSIS — N183 Chronic kidney disease, stage 3 unspecified: Secondary | ICD-10-CM

## 2016-11-09 DIAGNOSIS — K5909 Other constipation: Secondary | ICD-10-CM | POA: Diagnosis not present

## 2016-11-09 DIAGNOSIS — K219 Gastro-esophageal reflux disease without esophagitis: Secondary | ICD-10-CM | POA: Diagnosis not present

## 2016-11-09 DIAGNOSIS — I10 Essential (primary) hypertension: Secondary | ICD-10-CM | POA: Diagnosis not present

## 2016-11-09 DIAGNOSIS — G8191 Hemiplegia, unspecified affecting right dominant side: Secondary | ICD-10-CM | POA: Diagnosis not present

## 2016-11-09 DIAGNOSIS — D72829 Elevated white blood cell count, unspecified: Secondary | ICD-10-CM

## 2016-11-09 DIAGNOSIS — D500 Iron deficiency anemia secondary to blood loss (chronic): Secondary | ICD-10-CM | POA: Diagnosis not present

## 2016-11-09 NOTE — Progress Notes (Signed)
LOCATION: [facility]  PCP: [name], MD   Code Status: Full Code  Goals of care: Advanced Directive information Advanced Directives 11/02/2016  Does Patient Have a Medical Advance Directive? No  Type of Advance Directive -  Would patient like information on creating a medical advance directive? No - Patient declined       Extended Emergency Contact Information Primary Emergency Contact: Quinones,Edward Address: [address]          [place], [place] [address] [place] Phone: [phone] Mobile Phone: [phone] Relation: Son Secondary Emergency Contact: Gwynn,Tabitha Address: [address]          [place], [place] [address] [place] Phone: [phone] Work Phone: [phone] Mobile Phone: [phone] Relation: Niece   Allergies  Allergen Reactions  . Cardura [Doxazosin Mesylate]     Allergic to pill form  . Clonidine Derivatives     Allergic to pill form  . Prinzide [Lisinopril-Hydrochlorothiazide]     Allergic to pill form    Chief Complaint  Patient presents with  . New Admit To SNF    New Admission Visit      HPI:  Patient is a 83 y.o. female seen today for short term rehabilitation post hospital admission from 11/02/16-11/06/16 post fall with nondisplaced right intertrochanteric femur fracture. She underwent IM nail. She required adjustment to her antihypertensive for elevated BP. She has medical history of HTN, CKD stage 3, CVA with right sided weakness among others. She is seen in her room today with her son at bedside.   Review of Systems:  Constitutional: Negative for fever, chills, diaphoresis.  HENT: Negative for headache, congestion, nasal discharge, sore throat, difficulty swallowing.   Eyes: Negative for eye pain, blurred vision, double vision and discharge. Wears glasses Respiratory: Negative for cough, SOB and wheezing.  Cardiovascular: Negative for chest pain, palpitations, leg swelling.    Gastrointestinal: Negative for heartburn, nausea, vomiting, abdominal pain, loss of appetite. Last bowel movement was day before yesterday.  Genitourinary: Negative for dysuria Musculoskeletal: Negative for back pain, fall in the facility.  Skin: Negative for itching, rash.  Neurological: Negative for dizziness. Positive for chronic ride sided weakness post stroke Psychiatric/Behavioral: positive for depression   Past Medical History:  Diagnosis Date  . Angio-edema    one episode in 1st week of March 2018 - did not see dr.- son and pt state mouth and tongue swelled so bad she could not talk  . Hypertension   . Stroke [facility]) 2011   right arm and right leg effected  . Thyroid disorder    Past Surgical History:  Procedure Laterality Date  . ABDOMINAL HYSTERECTOMY     "partial hysterectomy"  . INTRAMEDULLARY (IM) NAIL INTERTROCHANTERIC Right 11/03/2016   Procedure: INTRAMEDULLARY (IM) NAIL INTERTROCHANTRIC;  Surgeon: Paralee Cancel, MD;  Location: [facility];  Service: Orthopedics;  Laterality: Right;  . right kidney surgery  approx 2010   "cyst removed from kidney"   Social History:   reports that she quit smoking about 25 years ago. Her smoking use included Cigarettes. She quit after 20.00 years of use. She has never used smokeless tobacco. She reports that she does not drink alcohol or use drugs.  No family history on file.  Medications: Allergies as of 11/09/2016      Reactions   Cardura [doxazosin Mesylate]    Allergic to pill form   Clonidine Derivatives    Allergic to pill form   Prinzide [lisinopril-hydrochlorothiazide]  Allergic to pill form      Medication List       Accurate as of 11/09/16 12:42 PM. Always use your most recent med list.          ALPRAZolam 0.5 MG tablet Commonly known as:  XANAX Take 0.5 mg by mouth at bedtime as needed for sleep.   amLODipine 10 MG tablet Commonly known as:  NORVASC Take 10 mg by mouth daily.   aspirin 325 MG tablet Take 325  mg by mouth daily.   atorvastatin 20 MG tablet Commonly known as:  LIPITOR Take 20 mg by mouth at bedtime.   cholecalciferol 1000 units tablet Commonly known as:  VITAMIN D Take 1,000 Units by mouth daily.   citalopram 10 MG tablet Commonly known as:  CELEXA Take 10 mg by mouth daily.   cloNIDine 0.1 mg/24hr patch Commonly known as:  CATAPRES - Dosed in mg/24 hr Place 1 patch onto the skin once a week. Applies on Fridays.   docusate sodium 100 MG capsule Commonly known as:  COLACE Take 1 capsule (100 mg total) by mouth 2 (two) times daily.   enoxaparin 30 MG/0.3ML injection Commonly known as:  LOVENOX Inject 0.3 mLs (30 mg total) into the skin daily.   ferrous sulfate 325 (65 FE) MG tablet Take 325 mg by mouth daily with breakfast.   hydrALAZINE 100 MG tablet Commonly known as:  APRESOLINE Take 1 tablet (100 mg total) by mouth 4 (four) times daily.   HYDROcodone-acetaminophen 5-325 MG tablet Commonly known as:  NORCO/VICODIN Take 1-2 tablets by mouth every 6 (six) hours as needed for moderate pain.   NON FORMULARY Take 120 mLs by mouth 2 (two) times daily. Med pass   nortriptyline 25 MG capsule Commonly known as:  PAMELOR Take 1 capsule (25 mg total) by mouth at bedtime.   pantoprazole 40 MG tablet Commonly known as:  PROTONIX Take 40 mg by mouth daily. For indigestion.   polyethylene glycol packet Commonly known as:  MIRALAX / GLYCOLAX Take 17 g by mouth daily as needed for mild constipation.   senna 8.6 MG Tabs tablet Commonly known as:  SENOKOT Take 1 tablet by mouth daily as needed. For constipation.       Immunizations: Immunization History  Administered Date(s) Administered  . PPD Test 11/06/2016     Physical Exam: Vitals:   11/09/16 1235  BP: (!) 150/79  Pulse: 80  Resp: 18  Temp: 97.9 F (36.6 C)  TempSrc: Oral  SpO2: 95%  Weight: 138 lb 3.2 oz (62.7 kg)  Height: 5\' 5"  (1.651 m)   Body mass index is 23 kg/m.  General- elderly  female, well built, in no acute distress Head- normocephalic, atraumatic Nose- no nasal discharge Throat- moist mucus membrane, has dentures Eyes- PERRLA, EOMI, no pallor, no icterus, no discharge, normal conjunctiva, normal sclera Neck- no cervical lymphadenopathy Cardiovascular- normal s1,s2, no murmur Respiratory- bilateral clear to auscultation, no wheeze, no rhonchi, no crackles, no use of accessory muscles Abdomen- bowel sounds present, soft, non tender, no guarding or rigidity, no CVA tenderness Musculoskeletal- generalized weakness right > left with right hemiparesis, limited right hip ROM, trace right leg edema Neurological- alert and oriented to person, place and time Skin- warm and dry, bruise to right arm Psychiatry- tearful during conversation    Labs reviewed: Basic Metabolic Panel:  Recent Labs  11/04/16 0719 11/05/16 0400 11/06/16 0500  NA 144 141 141  K 4.4 3.7 3.8  CL 116* 112* 109  CO2 20* 21* 20*  GLUCOSE 116* 118* 113*  BUN 45* 41* 38*  CREATININE 2.88* 2.79* 2.57*  CALCIUM 8.5* 8.6* 8.6*   Liver Function Tests:  Recent Labs  11/03/16 0417  AST 20  ALT 14  ALKPHOS 73  BILITOT 0.5  PROT 7.0  ALBUMIN 3.8   No results for input(s): LIPASE, AMYLASE in the last 8760 hours. No results for input(s): AMMONIA in the last 8760 hours. CBC:  Recent Labs  11/02/16 2006 11/03/16 0417  11/04/16 0719 11/05/16 0400 11/06/16 0500  WBC 16.3* 16.0*  < > 13.1* 12.5* 14.6*  NEUTROABS 15.3* 13.4*  --   --   --   --   HGB 10.5* 9.9*  < > 8.3* 7.8* 7.8*  HCT 32.2* 30.3*  < > 27.3* 25.4* 24.3*  MCV 84.5 84.9  < > 87.8 87.6 86.2  PLT 118* 120*  < > 108* 94* 92*  < > = values in this interval not displayed. Cardiac Enzymes: No results for input(s): CKTOTAL, CKMB, CKMBINDEX, TROPONINI in the last 8760 hours. BNP: Invalid input(s): POCBNP CBG: No results for input(s): GLUCAP in the last 8760 hours.  Radiological Exams: Dg Chest 1 View  Result Date:  11/02/2016 CLINICAL DATA:  Preop for right hip fracture fixation. EXAM: CHEST 1 VIEW COMPARISON:  06/16/2015 FINDINGS: The heart is within normal limits in size and stable. There is moderate tortuosity and calcification of the thoracic aorta. The lungs are clear of acute process. No pleural effusion. The bony thorax is intact. IMPRESSION: No acute cardiopulmonary findings. Electronically Signed   By: Marijo Sanes M.D.   On: 11/02/2016 20:01   Dg Knee Right Port  Result Date: 11/02/2016 CLINICAL DATA:  [place] [address] in bathroom this morning. Known RIGHT hip fracture. EXAM: PORTABLE RIGHT KNEE - 1-2 VIEW COMPARISON:  None. FINDINGS: No evidence of fracture, dislocation, or joint effusion. No evidence of arthropathy or other focal bone abnormality. Mild meniscal calcifications. Moderate vascular calcifications. IMPRESSION: No acute fracture deformity or dislocation. Electronically Signed   By: Elon Alas M.D.   On: 11/02/2016 21:50   Dg C-arm 1-60 Min  Result Date: 11/03/2016 CLINICAL DATA:  Right hip fracture repair EXAM: OPERATIVE RIGHT HIP (WITH PELVIS IF PERFORMED)  VIEWS TECHNIQUE: Fluoroscopic spot image(s) were submitted for interpretation post-operatively. Fluoroscopic time: 64 seconds. Images: 2 COMPARISON:  None. FINDINGS: An intramedullary rod and nail have been placed across the intertrochanteric fracture. No dislocation. The inferior trochanter remains displaced. IMPRESSION: Fracture repair as above Electronically Signed   By: Dorise Bullion III M.D   On: 11/03/2016 17:50   Dg Hip Operative Unilat W Or W/o Pelvis Right  Result Date: 11/03/2016 CLINICAL DATA:  Right hip fracture repair EXAM: OPERATIVE RIGHT HIP (WITH PELVIS IF PERFORMED)  VIEWS TECHNIQUE: Fluoroscopic spot image(s) were submitted for interpretation post-operatively. Fluoroscopic time: 64 seconds. Images: 2 COMPARISON:  None. FINDINGS: An intramedullary rod and nail have been placed across the intertrochanteric fracture. No  dislocation. The inferior trochanter remains displaced. IMPRESSION: Fracture repair as above Electronically Signed   By: Dorise Bullion III M.D   On: 11/03/2016 17:50   Dg Hip Unilat W Or Wo Pelvis 2-3 Views Right  Result Date: 11/02/2016 CLINICAL DATA:  [place] in bathroom today at 1100 hours. Subsequent RIGHT hip pain. EXAM: DG HIP (WITH OR WITHOUT PELVIS) 2-3V RIGHT COMPARISON:  None. FINDINGS: Acute RIGHT femur intertrochanteric fracture in alignment. Avulsion of the RIGHT lesser trochanter. No dislocation. No destructive bony lesions. Mild bilateral hip osteoarthrosis.  Moderate degenerative change of the included lumbar spine. Moderate vascular calcifications. Phleboliths project in the pelvis. IMPRESSION: Acute nondisplaced RIGHT femur intertrochanteric fracture. No dislocation. Electronically Signed   By: Elon Alas M.D.   On: 11/02/2016 19:19    Assessment/Plan  Unsteady gait With right sided weakness post cva and recent fall with right femur fracture. Will have her work with physical therapy and occupational therapy team to help with gait training and muscle strengthening exercises.fall precautions. Skin care. Encourage to be out of bed.   Right intertrochanteric femur fracture s/p IM nail. Will need follow up with orthopedic. Will have patient work with PT/OT as tolerated to regain strength and restore function.  Fall precautions are in place. Continue lovenox for DVT prophylaxis for total 2 weeks. Continue hydrocodone-APAP 5-325 mg 1-2 tab q6h prn pain  Blood loss anemia Post operation, check cbc. Continue feso4  Depression Per son, patient has frequent crying episodes. Patient feels depressed. Currently on citalopram 10 mg daily and nortriptyline 25 mg qhs.increase citalopram to 20 mg daily. Get psychiatry consult. Monitor for signs of PBA and consider neudexta if needed.   Leukocytosis Afebrile. Likely reactive. Monitor for signs of infection. Monitor wbc and temp curve.    Thrombocytopenia No bleed reported. Monitor platelet count with her on lovenox  ckd stage 3 Check bmp, s/p iv fluids for acute on chronic renal failure in hospital  Right hemiparesis with CVA Continue atorvastatin  Late effect cva Continue atrovastatin 20 mg daily and aspirin 325 mg daily  Chronic constipation On colace 100 mg bid, senna daily prn and miralax qd prn, maintain hydration  gerd Denies symptom this visit. Continue pantoprazole  HTN Check bp q shift, continue amlodipine 10 mg daily, clonidine 0.1 mg daily and hydralazine 100 mg qid. Check bmp    Goals of care: short term rehabilitation   Labs/tests ordered: cbc, cmp 11/13/16   Family/ staff Communication: reviewed care plan with patient, her son and nursing supervisor    Blanchie Serve, MD Internal Medicine [place], [place] [address] Cell Phone (Monday-Friday 8 am - 5 pm): [phone] On Call: [phone] and follow prompts after 5 pm and on weekends Office Phone: [phone] Office Fax: [phone]

## 2016-11-13 LAB — HEPATIC FUNCTION PANEL
ALK PHOS: 66 U/L (ref 25–125)
ALT: 14 U/L (ref 7–35)
AST: 16 U/L (ref 13–35)
Bilirubin, Total: 0.5 mg/dL

## 2016-11-13 LAB — BASIC METABOLIC PANEL
BUN: 48 mg/dL — AB (ref 4–21)
Creatinine: 2.5 mg/dL — AB (ref 0.5–1.1)
Glucose: 97 mg/dL
POTASSIUM: 4 mmol/L (ref 3.4–5.3)
Sodium: 142 mmol/L (ref 137–147)

## 2016-11-13 LAB — CBC AND DIFFERENTIAL
HCT: 23 % — AB (ref 36–46)
HEMOGLOBIN: 7.3 g/dL — AB (ref 12.0–16.0)
PLATELETS: 145 10*3/uL — AB (ref 150–399)
WBC: 10.4 10*3/mL

## 2016-11-14 ENCOUNTER — Non-Acute Institutional Stay (SKILLED_NURSING_FACILITY): Payer: Medicare Other | Admitting: Family

## 2016-11-14 ENCOUNTER — Encounter: Payer: Self-pay | Admitting: Family

## 2016-11-14 DIAGNOSIS — E440 Moderate protein-calorie malnutrition: Secondary | ICD-10-CM

## 2016-11-14 DIAGNOSIS — D638 Anemia in other chronic diseases classified elsewhere: Secondary | ICD-10-CM

## 2016-11-14 DIAGNOSIS — I129 Hypertensive chronic kidney disease with stage 1 through stage 4 chronic kidney disease, or unspecified chronic kidney disease: Secondary | ICD-10-CM | POA: Diagnosis not present

## 2016-11-14 DIAGNOSIS — D696 Thrombocytopenia, unspecified: Secondary | ICD-10-CM

## 2016-11-14 DIAGNOSIS — N183 Chronic kidney disease, stage 3 unspecified: Secondary | ICD-10-CM

## 2016-11-14 DIAGNOSIS — E8809 Other disorders of plasma-protein metabolism, not elsewhere classified: Secondary | ICD-10-CM | POA: Diagnosis not present

## 2016-11-14 MED ORDER — METOPROLOL SUCCINATE ER 25 MG PO TB24: 12.5000 mg | ORAL_TABLET | Freq: Every day | ORAL | 3 refills | Status: DC

## 2016-11-14 MED ORDER — FERROUS SULFATE 325 (65 FE) MG PO TABS: 325.0000 mg | ORAL_TABLET | Freq: Two times a day (BID) | ORAL | Status: AC

## 2016-11-14 MED ORDER — MIRTAZAPINE 7.5 MG PO TABS: 7.5000 mg | ORAL_TABLET | Freq: Every day | ORAL | Status: DC

## 2016-11-14 NOTE — Progress Notes (Signed)
Location:  [place] Room Number: [phone] Place of Service:  SNF [phone]) Provider: Colt Martelle FNP-C  [name], MD  Patient Care Team: Jonathon Jordan, MD as PCP - General  Extended Emergency Contact Information Primary Emergency Contact: [facility] Address: [address]          [place], [place] [address] [place] Phone: [phone] Mobile Phone: [phone] Relation: Son Secondary Emergency Contact: Gwynn,Tabitha Address: [address]          [place], [place] [address] [place] Phone: [phone] Work Phone: [phone] Mobile Phone: [phone] Relation: Niece  Code Status: Full Code  Goals of care: Advanced Directive information Advanced Directives 11/14/2016  Does Patient Have a Medical Advance Directive? No  Type of Advance Directive -  Would patient like information on creating a medical advance directive? No - Patient declined     Chief Complaint  Patient presents with  . Acute Visit    Blood pressure elevated     HPI:  Pt is a 83 y.o. female seen today at [facility] for an acute visit for evaluation of evaluation of elevated blood pressure and abnormal lab results. She has a significant medical history of HTN,CVA, CKD stage 3, thrombocytopenia, Anemia among other conditions. She is seen in her room today. She complains of lack of appetite.she states right hip pain under control with current pain regimen.Her recent lab results showed Hgb 7.3, HCT 23.2, PLTs 145, BUN 48, CR 2.54, TP 5.4, Alb 3.07 ( 11/13/2016). She denies any fever, chills or signs of bleeding.Facility Nurse reports patient's B/P elevated 200/80.Facility b/p log reviewed SBP ranging in the 150's-200's.she denies any headache, dizziness,N/V, chest pain or shortness of breath.She continues to work well with therapy.      Past Medical History:  Diagnosis Date  . Angio-edema    one episode in  1st week of March 2018 - did not see dr.- son and pt state mouth and tongue swelled so bad she could not talk  . Hypertension   . Stroke [facility]) 2011   right arm and right leg effected  . Thyroid disorder    Past Surgical History:  Procedure Laterality Date  . ABDOMINAL HYSTERECTOMY     "partial hysterectomy"  . INTRAMEDULLARY (IM) NAIL INTERTROCHANTERIC Right 11/03/2016   Procedure: INTRAMEDULLARY (IM) NAIL INTERTROCHANTRIC;  Surgeon: Paralee Cancel, MD;  Location: [facility];  Service: Orthopedics;  Laterality: Right;  . right kidney surgery  approx 2010   "cyst removed from kidney"    Allergies  Allergen Reactions  . Cardura [Doxazosin Mesylate]     Allergic to pill form  . Clonidine Derivatives     Allergic to pill form  . Prinzide [Lisinopril-Hydrochlorothiazide]     Allergic to pill form    Allergies as of 11/14/2016      Reactions   Cardura [doxazosin Mesylate]    Allergic to pill form   Clonidine Derivatives    Allergic to pill form   Prinzide [lisinopril-hydrochlorothiazide]    Allergic to pill form      Medication List       Accurate as of 11/14/16  2:24 PM. Always use your most recent med list.          ALPRAZolam 0.5 MG tablet Commonly known as:  XANAX Take 0.5 mg by mouth at bedtime as needed for sleep.   amLODipine 10 MG tablet Commonly known as:  NORVASC Take 10 mg by mouth  daily.   aspirin 325 MG tablet Take 325 mg by mouth daily.   atorvastatin 20 MG tablet Commonly known as:  LIPITOR Take 20 mg by mouth at bedtime.   cholecalciferol 1000 units tablet Commonly known as:  VITAMIN D Take 1,000 Units by mouth daily.   citalopram 20 MG tablet Commonly known as:  CELEXA Take 20 mg by mouth daily.   cloNIDine 0.1 mg/24hr patch Commonly known as:  CATAPRES - Dosed in mg/24 hr Place 1 patch onto the skin once a week. Applies on Fridays.   docusate sodium 100 MG capsule Commonly known as:  COLACE Take 1 capsule (100 mg total) by mouth 2 (two) times  daily.   enoxaparin 30 MG/0.3ML injection Commonly known as:  LOVENOX Inject 0.3 mLs (30 mg total) into the skin daily.   ferrous sulfate 325 (65 FE) MG tablet Take 1 tablet (325 mg total) by mouth 2 (two) times daily with a meal.   hydrALAZINE 100 MG tablet Commonly known as:  APRESOLINE Take 1 tablet (100 mg total) by mouth 4 (four) times daily.   HYDROcodone-acetaminophen 5-325 MG tablet Commonly known as:  NORCO/VICODIN Take 1-2 tablets by mouth every 6 (six) hours as needed for moderate pain.   metoprolol succinate 25 MG 24 hr tablet Commonly known as:  TOPROL-XL Take 0.5 tablets (12.5 mg total) by mouth daily.   NON FORMULARY Take 120 mLs by mouth 2 (two) times daily. Med pass   nortriptyline 25 MG capsule Commonly known as:  PAMELOR Take 1 capsule (25 mg total) by mouth at bedtime.   pantoprazole 40 MG tablet Commonly known as:  PROTONIX Take 40 mg by mouth daily. For indigestion.   polyethylene glycol packet Commonly known as:  MIRALAX / GLYCOLAX Take 17 g by mouth daily as needed for mild constipation.   senna 8.6 MG Tabs tablet Commonly known as:  SENOKOT Take 1 tablet by mouth daily as needed. For constipation.       Review of Systems  Constitutional: Positive for appetite change. Negative for activity change, chills, fatigue and fever.  HENT: Negative for congestion, rhinorrhea, sinus pain, sinus pressure, sneezing and sore throat.   Eyes: Negative.   Respiratory: Negative for cough, chest tightness, shortness of breath and wheezing.   Cardiovascular: Positive for leg swelling. Negative for chest pain and palpitations.  Gastrointestinal: Negative for abdominal distention, abdominal pain, constipation, diarrhea, nausea and vomiting.  Endocrine: Negative.   Genitourinary: Negative for dysuria, flank pain, frequency and urgency.  Musculoskeletal: Positive for gait problem.       Right hip pain   Skin: Negative for color change, pallor and rash.    Neurological: Negative for dizziness, seizures, syncope, light-headedness and headaches.  Hematological: Does not bruise/bleed easily.  Psychiatric/Behavioral: Negative for agitation, confusion, hallucinations and sleep disturbance. The patient is not nervous/anxious.     Immunization History  Administered Date(s) Administered  . PPD Test 11/06/2016   Pertinent  Health Maintenance Due  Topic Date Due  . DEXA SCAN  12/04/1999  . PNA vac Low Risk Adult (1 of 2 - PCV13) 12/04/1999  . INFLUENZA VACCINE  03/28/2016    Vitals:   11/14/16 1203  BP: (!) 190/91  Pulse: 77  Resp: 18  Temp: 99.5 F (37.5 C)  TempSrc: Oral  SpO2: 95%  Weight: 127 lb 14.4 oz (58 kg)  Height: 5\' 5"  (1.651 m)   Body mass index is 21.28 kg/m. Physical Exam  Constitutional: She is oriented to person, place,  and time. She appears well-developed and well-nourished. No distress.  HENT:  Head: Normocephalic.  Mouth/Throat: Oropharynx is clear and moist. No oropharyngeal exudate.  Eyes: Conjunctivae and EOM are normal. Pupils are equal, round, and reactive to light. Right eye exhibits no discharge. Left eye exhibits no discharge. No scleral icterus.  Neck: Normal range of motion. No JVD present. No thyromegaly present.  Cardiovascular: Normal rate, regular rhythm, normal heart sounds and intact distal pulses.  Exam reveals no gallop and no friction rub.   No murmur heard. Pulmonary/Chest: Effort normal and breath sounds normal. No respiratory distress. She has no wheezes. She has no rales.  Abdominal: Soft. Bowel sounds are normal. She exhibits no distension. There is no tenderness. There is no rebound and no guarding.  Musculoskeletal: She exhibits no tenderness or deformity.  Moves x 4 extremities. Limited ROM to RUE. Right side weakness noted. RUE and RLE 1-2 + edema.   Lymphadenopathy:    She has no cervical adenopathy.  Neurological: She is oriented to person, place, and time.  Skin: Skin is warm and  dry. No rash noted. No erythema. No pallor.  Right hip drsg with small amounts of old bloody drainage. Surrounding skin without any redness, swelling or tenderness.   Psychiatric: She has a normal mood and affect.    Labs reviewed:  Recent Labs  11/04/16 0719 11/05/16 0400 11/06/16 0500 11/13/16  NA 144 141 141 142  K 4.4 3.7 3.8 4.0  CL 116* 112* 109  --   CO2 20* 21* 20*  --   GLUCOSE 116* 118* 113*  --   BUN 45* 41* 38* 48*  CREATININE 2.88* 2.79* 2.57* 2.5*  CALCIUM 8.5* 8.6* 8.6*  --     Recent Labs  11/03/16 0417 11/13/16  AST 20 16  ALT 14 14  ALKPHOS 73 66  BILITOT 0.5  --   PROT 7.0  --   ALBUMIN 3.8  --     Recent Labs  11/02/16 2006 11/03/16 0417  11/04/16 0719 11/05/16 0400 11/06/16 0500 11/13/16  WBC 16.3* 16.0*  < > 13.1* 12.5* 14.6* 10.4  NEUTROABS 15.3* 13.4*  --   --   --   --   --   HGB 10.5* 9.9*  < > 8.3* 7.8* 7.8* 7.3*  HCT 32.2* 30.3*  < > 27.3* 25.4* 24.3* 23*  MCV 84.5 84.9  < > 87.8 87.6 86.2  --   PLT 118* 120*  < > 108* 94* 92* 145*  < > = values in this interval not displayed. Lab Results  Component Value Date   TSH 0.075 (L) 11/02/2016   Lab Results  Component Value Date   HGBA1C (H) 02/24/2009    6.2 (NOTE) The ADA recommends the following therapeutic goal for glycemic control related to Hgb A1c measurement: Goal of therapy: <6.5 Hgb A1c  Reference: American Diabetes Association: Clinical Practice Recommendations 2010, Diabetes Care, 2010, 33: (Suppl  1).   Lab Results  Component Value Date   CHOL (H) 02/25/2009    245        ATP III CLASSIFICATION:  <200     mg/dL   Desirable  200-239  mg/dL   Borderline High  >=240    mg/dL   High          HDL 48 02/25/2009   LDLCALC (H) 02/25/2009    178        Total Cholesterol/HDL:CHD Risk Coronary Heart Disease Risk Table  Men   Women  1/2 Average Risk   3.4   3.3  Average Risk       5.0   4.4  2 X Average Risk   9.6   7.1  3 X Average Risk  23.4    11.0        Use the calculated Patient Ratio above and the CHD Risk Table to determine the patient's CHD Risk.        ATP III CLASSIFICATION (LDL):  <100     mg/dL   Optimal  100-129  mg/dL   Near or Above                    Optimal  130-159  mg/dL   Borderline  160-189  mg/dL   High  >190     mg/dL   Very High   TRIG 94 02/25/2009   CHOLHDL 5.1 02/25/2009    Assessment/Plan 1. Benign hypertension with chronic kidney disease, stage III SB/p ranging in the 150's-200's.asymptomatic. Continue on hydralazine,clonidine patch and amlodipine. Add metoprolol succinate 25 mg tablet take 1/2 tablet daily. Continue to monitor B/P.     2. CKD (chronic kidney disease) stage 3, GFR 30-59 ml/min CR 2.54 at baseline. Continue to avoid nephrotoxins and dose all other meds for renal clearance. Repeat CMP 11/28/2016.   3. Moderate protein-calorie malnutrition TP 5.4 ( 11/13/2016). Reports lack of appetite. RD consult for protein supplements. Recheck CMP 11/28/2016.    4. Hypoalbuminemia ALB 2.17 ( 11/13/2016). RD consult for supplements.Recheck CMP 11/28/2016.    5. Anemia of chronic disease Hgb 7.3, HCT 23.2 ( 11/13/2016).Previous Hgb 7.8 ( 11/06/2016).will increase Ferrous sulfate 325 mg Tablet to one by mouth twice daily. Continue with Colace.Recheck CBC 11/28/2016.     6. Thrombocytopenia  PLT 145 ( 11/13/2016). Previous 92 ( 11/06/2016). No signs of bleeding. Continue to monitor.   Family/ staff Communication: Reviewed plan of care with patient and facility Nurse supervisor  Labs/tests ordered: CBC, CMP 11/28/2016.  Sandrea Hughs, NP

## 2016-11-22 ENCOUNTER — Encounter: Payer: Self-pay | Admitting: Family

## 2016-11-22 ENCOUNTER — Non-Acute Institutional Stay (SKILLED_NURSING_FACILITY): Payer: Medicare Other | Admitting: Family

## 2016-11-22 DIAGNOSIS — Z8673 Personal history of transient ischemic attack (TIA), and cerebral infarction without residual deficits: Secondary | ICD-10-CM | POA: Diagnosis not present

## 2016-11-22 DIAGNOSIS — F329 Major depressive disorder, single episode, unspecified: Secondary | ICD-10-CM

## 2016-11-22 DIAGNOSIS — D696 Thrombocytopenia, unspecified: Secondary | ICD-10-CM | POA: Diagnosis not present

## 2016-11-22 DIAGNOSIS — I129 Hypertensive chronic kidney disease with stage 1 through stage 4 chronic kidney disease, or unspecified chronic kidney disease: Secondary | ICD-10-CM | POA: Diagnosis not present

## 2016-11-22 DIAGNOSIS — N183 Chronic kidney disease, stage 3 unspecified: Secondary | ICD-10-CM

## 2016-11-22 DIAGNOSIS — S72144P Nondisplaced intertrochanteric fracture of right femur, subsequent encounter for closed fracture with malunion: Secondary | ICD-10-CM | POA: Diagnosis not present

## 2016-11-22 DIAGNOSIS — D509 Iron deficiency anemia, unspecified: Secondary | ICD-10-CM

## 2016-11-22 DIAGNOSIS — R2681 Unsteadiness on feet: Secondary | ICD-10-CM

## 2016-11-22 DIAGNOSIS — F32A Depression, unspecified: Secondary | ICD-10-CM

## 2016-11-22 NOTE — Progress Notes (Signed)
Location:  [facility] Room Number: [place] of Service:  SNF [phone])  Provider: Marlowe Sax FNP-C   PCP: [name], MD Patient Care Team: Jonathon Jordan, MD as PCP - General  Extended Emergency Contact Information Primary Emergency Contact: [facility] Address: [address]          [place], [place] [address] [place] Phone: [phone] Mobile Phone: [phone] Relation: Son Secondary Emergency Contact: Gwynn,Tabitha Address: [address]          [place], [place] [address] [place] Phone: [phone] Work Phone: [phone] Mobile Phone: [phone] Relation: Niece  Code Status: Full  code  Goals of care:  Advanced Directive information Advanced Directives 11/14/2016  Does Patient Have a Medical Advance Directive? No  Type of Advance Directive -  Would patient like information on creating a medical advance directive? No - Patient declined     Allergies  Allergen Reactions  . Cardura [Doxazosin Mesylate]     Allergic to pill form  . Clonidine Derivatives     Allergic to pill form  . Prinzide [Lisinopril-Hydrochlorothiazide]     Allergic to pill form    Chief Complaint  Patient presents with  . Discharge Note    Discharge from [facility]     HPI:  83 y.o. female seen today at [facility] for discharge home. She was here for short term rehabilitation for post hospital admission from  11/02/2016-11/06/2016 post fall with nondisplaced right intertrochanteric femur fracture. She underwent right hip IM nail.She required adjustment to her antihypertensive for elevated BP. She has a medical history of HTN, CVA, CKD stage 3, thrombocytopenia, Anemia among other conditions.She is seen in her room today with son at bedside.she denies any acute issues this visit. Right hip pain under control with current regimen.During her stay here in rehab her  SBP ranged in the 150's-200's and Metoprolol was added with much improvement.She was seen by Wilford Corner 11/16/2016 staples removed and steri-strips applied recommended WBAT, ASA for two more weeks. Follow up in 4 weeks.   She  has worked well with PT/OT now stable for discharge home.She will be discharged home with Home health PT/OT to continue with ROM, Exercise, Gait stability and muscle strengthening.She will also require a HHRN for medication management and Blood pressure check.She will require DME FWW with a right platform to allow her to maintain current level of independence with ADL's. Home health services will be arranged by facility social worker prior to discharge.she will discharge with medications from the facility. Prescription medication will be written x 1 month then patient to follow up with PCP in 1-2 weeks.Facility staff report no new concerns.   Past Medical History:  Diagnosis Date  . Angio-edema    one episode in 1st week of March 2018 - did not see dr.- son and pt state mouth and tongue swelled so bad she could not talk  . Hypertension   . Stroke [facility]) 2011   right arm and right leg effected  . Thyroid disorder     Past Surgical History:  Procedure Laterality Date  . ABDOMINAL HYSTERECTOMY     "partial hysterectomy"  . INTRAMEDULLARY (IM) NAIL INTERTROCHANTERIC Right 11/03/2016   Procedure: INTRAMEDULLARY (IM) NAIL INTERTROCHANTRIC;  Surgeon: Paralee Cancel, MD;  Location: [facility];  Service: Orthopedics;  Laterality: Right;  . right kidney surgery  approx 2010   "cyst removed from kidney"  reports that she quit smoking about 25 years ago. Her smoking use included Cigarettes. She quit after 20.00 years of use. She has never used smokeless tobacco. She reports that she does not drink alcohol or use drugs. Social History   Social History  . Marital status: Widowed    Spouse name: N/A  . Number of children: N/A  . Years of education: N/A   Occupational History    . Not on file.   Social History Main Topics  . Smoking status: Former Smoker    Years: 20.00    Types: Cigarettes    Quit date: 11/03/1991  . Smokeless tobacco: Never Used     Comment: smoked heavily in the past  . Alcohol use No  . Drug use: No  . Sexual activity: Not on file   Other Topics Concern  . Not on file   Social History Narrative  . No narrative on file    Allergies  Allergen Reactions  . Cardura [Doxazosin Mesylate]     Allergic to pill form  . Clonidine Derivatives     Allergic to pill form  . Prinzide [Lisinopril-Hydrochlorothiazide]     Allergic to pill form    Pertinent  Health Maintenance Due  Topic Date Due  . DEXA SCAN  12/04/1999  . PNA vac Low Risk Adult (1 of 2 - PCV13) 12/04/1999  . INFLUENZA VACCINE  03/28/2016    Medications: Allergies as of 11/22/2016      Reactions   Cardura [doxazosin Mesylate]    Allergic to pill form   Clonidine Derivatives    Allergic to pill form   Prinzide [lisinopril-hydrochlorothiazide]    Allergic to pill form      Medication List       Accurate as of 11/22/16  5:23 PM. Always use your most recent med list.          ALPRAZolam 0.5 MG tablet Commonly known as:  XANAX Take 0.5 mg by mouth at bedtime as needed for sleep.   amLODipine 10 MG tablet Commonly known as:  NORVASC Take 10 mg by mouth daily.   aspirin EC 81 MG tablet Take 81 mg by mouth daily.   atorvastatin 20 MG tablet Commonly known as:  LIPITOR Take 20 mg by mouth at bedtime.   cholecalciferol 1000 units tablet Commonly known as:  VITAMIN D Take 1,000 Units by mouth daily.   citalopram 20 MG tablet Commonly known as:  CELEXA Take 20 mg by mouth daily.   cloNIDine 0.1 mg/24hr patch Commonly known as:  CATAPRES - Dosed in mg/24 hr Place 1 patch onto the skin once a week. Applies on Fridays.   docusate sodium 100 MG capsule Commonly known as:  COLACE Take 1 capsule (100 mg total) by mouth 2 (two) times daily.   enoxaparin  30 MG/0.3ML injection Commonly known as:  LOVENOX Inject 0.3 mLs (30 mg total) into the skin daily.   ferrous sulfate 325 (65 FE) MG tablet Take 1 tablet (325 mg total) by mouth 2 (two) times daily with a meal.   hydrALAZINE 100 MG tablet Commonly known as:  APRESOLINE Take 1 tablet (100 mg total) by mouth 4 (four) times daily.   HYDROcodone-acetaminophen 5-325 MG tablet Commonly known as:  NORCO/VICODIN Take 1-2 tablets by mouth every 6 (six) hours as needed for moderate pain.   metoprolol succinate 25 MG 24 hr tablet Commonly known as:  TOPROL-XL Take 0.5 tablets (12.5 mg total) by mouth daily.   mirtazapine  7.5 MG tablet Commonly known as:  REMERON Take 1 tablet (7.5 mg total) by mouth at bedtime.   NON FORMULARY Take 120 mLs by mouth 2 (two) times daily. Med pass   nortriptyline 25 MG capsule Commonly known as:  PAMELOR Take 1 capsule (25 mg total) by mouth at bedtime.   pantoprazole 40 MG tablet Commonly known as:  PROTONIX Take 40 mg by mouth daily. For indigestion.   polyethylene glycol packet Commonly known as:  MIRALAX / GLYCOLAX Take 17 g by mouth daily as needed for mild constipation.   senna 8.6 MG Tabs tablet Commonly known as:  SENOKOT Take 1 tablet by mouth daily as needed. For constipation.       Review of Systems  Constitutional: Negative for activity change, appetite change, chills, fatigue and fever.  HENT: Negative for congestion, rhinorrhea, sinus pain, sinus pressure, sneezing and sore throat.   Eyes: Negative.   Respiratory: Negative for cough, chest tightness, shortness of breath and wheezing.   Cardiovascular: Positive for leg swelling. Negative for chest pain and palpitations.  Gastrointestinal: Negative for abdominal distention, abdominal pain, constipation, diarrhea, nausea and vomiting.  Endocrine: Negative.   Genitourinary: Negative for dysuria, flank pain, frequency and urgency.  Musculoskeletal: Positive for gait problem.        Right hip pain under control   Skin: Negative for color change, pallor and rash.       Right hip incision   Neurological: Negative for dizziness, seizures, syncope, light-headedness and headaches.  Hematological: Does not bruise/bleed easily.  Psychiatric/Behavioral: Negative for agitation, confusion, hallucinations and sleep disturbance. The patient is not nervous/anxious.     Vitals:   11/22/16 0903  BP: (!) 149/82  Pulse: 79  Resp: 19  Temp: 98.1 F (36.7 C)  TempSrc: Oral  SpO2: 98%  Weight: 136 lb 6.4 oz (61.9 kg)  Height: 5\' 5"  (1.651 m)   Body mass index is 22.7 kg/m. Physical Exam  Constitutional: She is oriented to person, place, and time. She appears well-developed and well-nourished. No distress.  HENT:  Head: Normocephalic.  Mouth/Throat: Oropharynx is clear and moist. No oropharyngeal exudate.  Eyes: Conjunctivae and EOM are normal. Pupils are equal, round, and reactive to light. Right eye exhibits no discharge. Left eye exhibits no discharge. No scleral icterus.  Neck: Normal range of motion. No JVD present. No thyromegaly present.  Cardiovascular: Normal rate, regular rhythm, normal heart sounds and intact distal pulses.  Exam reveals no gallop and no friction rub.   No murmur heard. Pulmonary/Chest: Effort normal and breath sounds normal. No respiratory distress. She has no wheezes. She has no rales.  Abdominal: Soft. Bowel sounds are normal. She exhibits no distension. There is no tenderness. There is no rebound and no guarding.  Musculoskeletal: She exhibits no tenderness or deformity.  Moves x 4 extremities. Limited ROM to RUE. Right side weakness noted. RLE 1-2 + edema.   Lymphadenopathy:    She has no cervical adenopathy.  Neurological: She is oriented to person, place, and time.  Skin: Skin is warm and dry. No rash noted. No erythema. No pallor.  Right hip surgical incision progressive healing with steri-strips intact. Surrounding skin without any signs of  infections.   Psychiatric: She has a normal mood and affect.    Labs reviewed: Basic Metabolic Panel:  Recent Labs  11/04/16 0719 11/05/16 0400 11/06/16 0500 11/13/16  NA 144 141 141 142  K 4.4 3.7 3.8 4.0  CL 116* 112* 109  --  CO2 20* 21* 20*  --   GLUCOSE 116* 118* 113*  --   BUN 45* 41* 38* 48*  CREATININE 2.88* 2.79* 2.57* 2.5*  CALCIUM 8.5* 8.6* 8.6*  --    Liver Function Tests:  Recent Labs  11/03/16 0417 11/13/16  AST 20 16  ALT 14 14  ALKPHOS 73 66  BILITOT 0.5  --   PROT 7.0  --   ALBUMIN 3.8  --    CBC:  Recent Labs  11/02/16 2006 11/03/16 0417  11/04/16 0719 11/05/16 0400 11/06/16 0500 11/13/16  WBC 16.3* 16.0*  < > 13.1* 12.5* 14.6* 10.4  NEUTROABS 15.3* 13.4*  --   --   --   --   --   HGB 10.5* 9.9*  < > 8.3* 7.8* 7.8* 7.3*  HCT 32.2* 30.3*  < > 27.3* 25.4* 24.3* 23*  MCV 84.5 84.9  < > 87.8 87.6 86.2  --   PLT 118* 120*  < > 108* 94* 92* 145*  < > = values in this interval not displayed.  Procedures and Imaging Studies During Stay: Dg Chest 1 View  Result Date: 11/02/2016 CLINICAL DATA:  Preop for right hip fracture fixation. EXAM: CHEST 1 VIEW COMPARISON:  06/16/2015 FINDINGS: The heart is within normal limits in size and stable. There is moderate tortuosity and calcification of the thoracic aorta. The lungs are clear of acute process. No pleural effusion. The bony thorax is intact. IMPRESSION: No acute cardiopulmonary findings. Electronically Signed   By: Marijo Sanes M.D.   On: 11/02/2016 20:01   Dg Knee Right Port  Result Date: 11/02/2016 CLINICAL DATA:  [place] in bathroom this morning. Known RIGHT hip fracture. EXAM: PORTABLE RIGHT KNEE - 1-2 VIEW COMPARISON:  None. FINDINGS: No evidence of fracture, dislocation, or joint effusion. No evidence of arthropathy or other focal bone abnormality. Mild meniscal calcifications. Moderate vascular calcifications. IMPRESSION: No acute fracture deformity or dislocation. Electronically Signed   By:  Elon Alas M.D.   On: 11/02/2016 21:50   Dg C-arm 1-60 Min  Result Date: 11/03/2016 CLINICAL DATA:  Right hip fracture repair EXAM: OPERATIVE RIGHT HIP (WITH PELVIS IF PERFORMED)  VIEWS TECHNIQUE: Fluoroscopic spot image(s) were submitted for interpretation post-operatively. Fluoroscopic time: 64 seconds. Images: 2 COMPARISON:  None. FINDINGS: An intramedullary rod and nail have been placed across the intertrochanteric fracture. No dislocation. The inferior trochanter remains displaced. IMPRESSION: Fracture repair as above Electronically Signed   By: Dorise Bullion III M.D   On: 11/03/2016 17:50   Dg Hip Operative Unilat W Or W/o Pelvis Right  Result Date: 11/03/2016 CLINICAL DATA:  Right hip fracture repair EXAM: OPERATIVE RIGHT HIP (WITH PELVIS IF PERFORMED)  VIEWS TECHNIQUE: Fluoroscopic spot image(s) were submitted for interpretation post-operatively. Fluoroscopic time: 64 seconds. Images: 2 COMPARISON:  None. FINDINGS: An intramedullary rod and nail have been placed across the intertrochanteric fracture. No dislocation. The inferior trochanter remains displaced. IMPRESSION: Fracture repair as above Electronically Signed   By: Dorise Bullion III M.D   On: 11/03/2016 17:50   Dg Hip Unilat W Or Wo Pelvis 2-3 Views Right  Result Date: 11/02/2016 CLINICAL DATA:  [place] in bathroom today at 1100 hours. Subsequent RIGHT hip pain. EXAM: DG HIP (WITH OR WITHOUT PELVIS) 2-3V RIGHT COMPARISON:  None. FINDINGS: Acute RIGHT femur intertrochanteric fracture in alignment. Avulsion of the RIGHT lesser trochanter. No dislocation. No destructive bony lesions. Mild bilateral hip osteoarthrosis. Moderate degenerative change of the included lumbar spine. Moderate vascular calcifications. Phleboliths project  in the pelvis. IMPRESSION: Acute nondisplaced RIGHT femur intertrochanteric fracture. No dislocation. Electronically Signed   By: Elon Alas M.D.   On: 11/02/2016 19:19   Assessment/Plan:   1. Unsteady  gait  Has worked well with PT/ OT.Will discharge home PT/OT to continue with ROM, Exercise, Gait stability and muscle strengthening. She will require a FWW with a right platform to allow her to maintain current level of independence with ADL's. Fall and safety precautions.   2. Benign hypertension with CKD (chronic kidney disease) stage III Required medication adjustment during hospital admission. SBP in the 150-200's during rehab Metoprolol added with much improvement. Will discharge home with [facility] for medication management and Blood pressure check.continue on amlodipine, Metoprolol, Hydralazine and clonidine patch. Follow up with PCP in 1-2 weeks. BMP with PCP.   3. CKD (chronic kidney disease) stage 3, GFR 30-59 ml/min CR 2.54 at baseline. Continue to avoid nephrotoxins and dose all other medication for renal clearance. BMP in 1-2 weeks with PCP.   4. Thrombocytopenia Recent Plts 145 ( 11/13/2016) previous was 92.no signs of bleeding.Continue to monitor.   5. History of CVA (cerebrovascular accident) Continue on metoprolol, amlodipine, hydralazine, ASA and Lipitor.   6. Nondisplaced intertrochanteric fracture of right femur, subsequent encounter for closed fracture with malunion Status post short term rehabilitation for post hospital admission from  11/02/2016-11/06/2016 post fall with nondisplaced right intertrochanteric femur fracture. She underwent right hip IM nail.continue with PT/OT.seen by Wilford Corner 11/16/2016 staples removed and steri-strips applied recommended WBAT, ASA for two more weeks. Follow up in 4 weeks.Incision healing well without any signs of infections. Continue current pain regimen.   7. Depression, unspecified depression type Stable. Continue on Celexa. Monitor for mood changes.   8. Anemia   continue on Ferrous sulfate 325 mg Tablet twice daily. CBC in 1-2 weeks with PCP.   Patient is being discharged with the following home health services:   -PT/OT for ROM,  exercise, gait stability and muscle strengthening  -  HHRN for medication management and Blood pressure check.  Patient is being discharged with the following durable medical equipment:    -  FWW with a right platform to allow her to maintain current level of independence with ADL's.  Patient has been advised to f/u with their PCP in 1-2 weeks to for a transitions of care visit.Social services at their facility was responsible for arranging this appointment.  Pt was provided with adequate prescriptions of noncontrolled medications to reach the scheduled appointment.For controlled substances, a limited supply was provided as appropriate for the individual patient. If the pt normally receives these medications from a pain clinic or has a contract with another physician, these medications should be received from that clinic or physician only).    Future labs/tests needed:  CBC, BMP in 1-2 weeks PCP

## 2017-05-06 ENCOUNTER — Observation Stay (HOSPITAL_COMMUNITY): Payer: Medicare Other

## 2017-05-06 ENCOUNTER — Encounter (HOSPITAL_COMMUNITY): Payer: Self-pay | Admitting: Emergency Medicine

## 2017-05-06 ENCOUNTER — Emergency Department (HOSPITAL_COMMUNITY): Payer: Medicare Other

## 2017-05-06 ENCOUNTER — Inpatient Hospital Stay (HOSPITAL_COMMUNITY)
Admission: EM | Admit: 2017-05-06 | Discharge: 2017-05-18 | DRG: 674 | Disposition: A | Discharge status: Home Health | Payer: Medicare Other | Attending: Internal Medicine | Admitting: Internal Medicine

## 2017-05-06 DIAGNOSIS — D72829 Elevated white blood cell count, unspecified: Secondary | ICD-10-CM | POA: Diagnosis not present

## 2017-05-06 DIAGNOSIS — I160 Hypertensive urgency: Secondary | ICD-10-CM | POA: Diagnosis not present

## 2017-05-06 DIAGNOSIS — N186 End stage renal disease: Secondary | ICD-10-CM

## 2017-05-06 DIAGNOSIS — Z87891 Personal history of nicotine dependence: Secondary | ICD-10-CM

## 2017-05-06 DIAGNOSIS — Z7982 Long term (current) use of aspirin: Secondary | ICD-10-CM

## 2017-05-06 DIAGNOSIS — D638 Anemia in other chronic diseases classified elsewhere: Secondary | ICD-10-CM | POA: Diagnosis present

## 2017-05-06 DIAGNOSIS — R5381 Other malaise: Secondary | ICD-10-CM | POA: Diagnosis not present

## 2017-05-06 DIAGNOSIS — D696 Thrombocytopenia, unspecified: Secondary | ICD-10-CM | POA: Diagnosis present

## 2017-05-06 DIAGNOSIS — Z85528 Personal history of other malignant neoplasm of kidney: Secondary | ICD-10-CM

## 2017-05-06 DIAGNOSIS — R112 Nausea with vomiting, unspecified: Secondary | ICD-10-CM

## 2017-05-06 DIAGNOSIS — Z905 Acquired absence of kidney: Secondary | ICD-10-CM

## 2017-05-06 DIAGNOSIS — E872 Acidosis: Secondary | ICD-10-CM | POA: Diagnosis present

## 2017-05-06 DIAGNOSIS — N184 Chronic kidney disease, stage 4 (severe): Secondary | ICD-10-CM | POA: Diagnosis not present

## 2017-05-06 DIAGNOSIS — Y832 Surgical operation with anastomosis, bypass or graft as the cause of abnormal reaction of the patient, or of later complication, without mention of misadventure at the time of the procedure: Secondary | ICD-10-CM | POA: Diagnosis not present

## 2017-05-06 DIAGNOSIS — Z992 Dependence on renal dialysis: Secondary | ICD-10-CM

## 2017-05-06 DIAGNOSIS — I120 Hypertensive chronic kidney disease with stage 5 chronic kidney disease or end stage renal disease: Secondary | ICD-10-CM | POA: Diagnosis present

## 2017-05-06 DIAGNOSIS — I69351 Hemiplegia and hemiparesis following cerebral infarction affecting right dominant side: Secondary | ICD-10-CM

## 2017-05-06 DIAGNOSIS — Z681 Body mass index (BMI) 19 or less, adult: Secondary | ICD-10-CM

## 2017-05-06 DIAGNOSIS — Z419 Encounter for procedure for purposes other than remedying health state, unspecified: Secondary | ICD-10-CM

## 2017-05-06 DIAGNOSIS — K5900 Constipation, unspecified: Secondary | ICD-10-CM | POA: Diagnosis present

## 2017-05-06 DIAGNOSIS — Z79899 Other long term (current) drug therapy: Secondary | ICD-10-CM

## 2017-05-06 DIAGNOSIS — Z90710 Acquired absence of both cervix and uterus: Secondary | ICD-10-CM

## 2017-05-06 DIAGNOSIS — N179 Acute kidney failure, unspecified: Principal | ICD-10-CM | POA: Diagnosis present

## 2017-05-06 DIAGNOSIS — Z23 Encounter for immunization: Secondary | ICD-10-CM

## 2017-05-06 DIAGNOSIS — Z452 Encounter for adjustment and management of vascular access device: Secondary | ICD-10-CM

## 2017-05-06 DIAGNOSIS — I161 Hypertensive emergency: Secondary | ICD-10-CM | POA: Diagnosis present

## 2017-05-06 DIAGNOSIS — T82838A Hemorrhage of vascular prosthetic devices, implants and grafts, initial encounter: Secondary | ICD-10-CM | POA: Diagnosis not present

## 2017-05-06 DIAGNOSIS — R110 Nausea: Secondary | ICD-10-CM

## 2017-05-06 DIAGNOSIS — R64 Cachexia: Secondary | ICD-10-CM | POA: Diagnosis present

## 2017-05-06 LAB — URINALYSIS, ROUTINE W REFLEX MICROSCOPIC
BILIRUBIN URINE: NEGATIVE
GLUCOSE, UA: NEGATIVE mg/dL
Hgb urine dipstick: NEGATIVE
KETONES UR: NEGATIVE mg/dL
LEUKOCYTES UA: NEGATIVE
Nitrite: NEGATIVE
PH: 5 (ref 5.0–8.0)
Protein, ur: >=300 mg/dL — AB
SPECIFIC GRAVITY, URINE: 1.009 (ref 1.005–1.030)

## 2017-05-06 LAB — CBC
HCT: 27.5 % — ABNORMAL LOW (ref 36.0–46.0)
Hemoglobin: 9 g/dL — ABNORMAL LOW (ref 12.0–15.0)
MCH: 28 pg (ref 26.0–34.0)
MCHC: 32.7 g/dL (ref 30.0–36.0)
MCV: 85.4 fL (ref 78.0–100.0)
PLATELETS: 125 10*3/uL — AB (ref 150–400)
RBC: 3.22 MIL/uL — AB (ref 3.87–5.11)
RDW: 15.2 % (ref 11.5–15.5)
WBC: 7.4 10*3/uL (ref 4.0–10.5)

## 2017-05-06 LAB — COMPREHENSIVE METABOLIC PANEL
ALBUMIN: 3.3 g/dL — AB (ref 3.5–5.0)
ALK PHOS: 56 U/L (ref 38–126)
ALT: 6 U/L — AB (ref 14–54)
AST: 12 U/L — ABNORMAL LOW (ref 15–41)
Anion gap: 10 (ref 5–15)
BILIRUBIN TOTAL: 0.7 mg/dL (ref 0.3–1.2)
BUN: 73 mg/dL — ABNORMAL HIGH (ref 6–20)
CALCIUM: 8.8 mg/dL — AB (ref 8.9–10.3)
CO2: 18 mmol/L — ABNORMAL LOW (ref 22–32)
CREATININE: 6.77 mg/dL — AB (ref 0.44–1.00)
Chloride: 111 mmol/L (ref 101–111)
GFR calc non Af Amer: 5 mL/min — ABNORMAL LOW (ref >60)
GFR, EST AFRICAN AMERICAN: 6 mL/min — AB (ref >60)
Glucose, Bld: 112 mg/dL — ABNORMAL HIGH (ref 65–99)
Potassium: 4 mmol/L (ref 3.5–5.1)
SODIUM: 139 mmol/L (ref 135–145)
TOTAL PROTEIN: 6.1 g/dL — AB (ref 6.5–8.1)

## 2017-05-06 LAB — LIPASE, BLOOD: Lipase: 36 U/L (ref 11–51)

## 2017-05-06 LAB — I-STAT TROPONIN, ED: Troponin i, poc: 0.05 ng/mL (ref 0.00–0.08)

## 2017-05-06 MED ORDER — HEPARIN SODIUM (PORCINE) 5000 UNIT/ML IJ SOLN
5000.0000 [IU] | Freq: Three times a day (TID) | INTRAMUSCULAR | Status: DC
  Administered 2017-05-07 – 2017-05-18 (×25): 5000 [IU] via SUBCUTANEOUS
  Filled 2017-05-06 (×22): qty 1

## 2017-05-06 MED ORDER — POLYETHYLENE GLYCOL 3350 17 G PO PACK: 17.0000 g | PACK | Freq: Every day | ORAL | Status: DC | PRN

## 2017-05-06 MED ORDER — STERILE WATER FOR INJECTION IV SOLN
INTRAVENOUS | Status: DC
  Administered 2017-05-07: 03:00:00 via INTRAVENOUS
  Filled 2017-05-06 (×2): qty 850

## 2017-05-06 MED ORDER — LABETALOL HCL 5 MG/ML IV SOLN
10.0000 mg | INTRAVENOUS | Status: DC | PRN
  Administered 2017-05-07 – 2017-05-14 (×3): 10 mg via INTRAVENOUS
  Filled 2017-05-06 (×3): qty 4

## 2017-05-06 MED ORDER — ACETAMINOPHEN 650 MG RE SUPP: 650.0000 mg | Freq: Four times a day (QID) | RECTAL | Status: DC | PRN

## 2017-05-06 MED ORDER — AMLODIPINE BESYLATE 10 MG PO TABS
10.0000 mg | ORAL_TABLET | Freq: Every day | ORAL | Status: DC
  Administered 2017-05-07 – 2017-05-18 (×12): 10 mg via ORAL
  Filled 2017-05-06 (×12): qty 1

## 2017-05-06 MED ORDER — PANTOPRAZOLE SODIUM 40 MG PO TBEC
40.0000 mg | DELAYED_RELEASE_TABLET | Freq: Every day | ORAL | Status: DC
Start: 2017-05-07 — End: 2017-05-18
  Administered 2017-05-07 – 2017-05-18 (×12): 40 mg via ORAL
  Filled 2017-05-06 (×12): qty 1

## 2017-05-06 MED ORDER — LABETALOL HCL 5 MG/ML IV SOLN
10.0000 mg | Freq: Once | INTRAVENOUS | Status: AC
  Administered 2017-05-06: 10 mg via INTRAVENOUS
  Filled 2017-05-06: qty 4

## 2017-05-06 MED ORDER — SENNA 8.6 MG PO TABS
1.0000 | ORAL_TABLET | Freq: Every day | ORAL | Status: DC | PRN
  Administered 2017-05-10: 8.6 mg via ORAL
  Filled 2017-05-06: qty 1

## 2017-05-06 MED ORDER — ACETAMINOPHEN 325 MG PO TABS
650.0000 mg | ORAL_TABLET | Freq: Four times a day (QID) | ORAL | Status: DC | PRN
  Administered 2017-05-09 – 2017-05-14 (×4): 650 mg via ORAL
  Filled 2017-05-06 (×5): qty 2

## 2017-05-06 MED ORDER — METOCLOPRAMIDE HCL 5 MG/ML IJ SOLN
10.0000 mg | Freq: Once | INTRAMUSCULAR | Status: AC
  Administered 2017-05-06: 10 mg via INTRAVENOUS
  Filled 2017-05-06: qty 2

## 2017-05-06 MED ORDER — FERROUS SULFATE 325 (65 FE) MG PO TABS
325.0000 mg | ORAL_TABLET | Freq: Two times a day (BID) | ORAL | Status: DC
  Administered 2017-05-07 – 2017-05-14 (×15): 325 mg via ORAL
  Filled 2017-05-06 (×16): qty 1

## 2017-05-06 MED ORDER — CITALOPRAM HYDROBROMIDE 20 MG PO TABS
10.0000 mg | ORAL_TABLET | Freq: Every day | ORAL | Status: DC
  Administered 2017-05-07 – 2017-05-18 (×9): 10 mg via ORAL
  Filled 2017-05-06 (×13): qty 1

## 2017-05-06 MED ORDER — HYDRALAZINE HCL 50 MG PO TABS
100.0000 mg | ORAL_TABLET | Freq: Three times a day (TID) | ORAL | Status: DC
  Administered 2017-05-07 – 2017-05-16 (×23): 100 mg via ORAL
  Filled 2017-05-06 (×24): qty 2

## 2017-05-06 MED ORDER — SODIUM CHLORIDE 0.9 % IV SOLN
Freq: Once | INTRAVENOUS | Status: AC
  Administered 2017-05-06: 21:00:00 via INTRAVENOUS

## 2017-05-06 MED ORDER — NORTRIPTYLINE HCL 25 MG PO CAPS
25.0000 mg | ORAL_CAPSULE | Freq: Every day | ORAL | Status: DC
Start: 2017-05-07 — End: 2017-05-18
  Administered 2017-05-07 – 2017-05-17 (×12): 25 mg via ORAL
  Filled 2017-05-06 (×13): qty 1

## 2017-05-06 MED ORDER — ONDANSETRON HCL 4 MG/2ML IJ SOLN
4.0000 mg | Freq: Four times a day (QID) | INTRAMUSCULAR | Status: DC | PRN
  Administered 2017-05-08 – 2017-05-15 (×5): 4 mg via INTRAVENOUS
  Filled 2017-05-06 (×5): qty 2

## 2017-05-06 MED ORDER — ATORVASTATIN CALCIUM 20 MG PO TABS
20.0000 mg | ORAL_TABLET | Freq: Every day | ORAL | Status: DC
Start: 2017-05-06 — End: 2017-05-18
  Administered 2017-05-07 – 2017-05-17 (×11): 20 mg via ORAL
  Filled 2017-05-06 (×13): qty 1

## 2017-05-06 MED ORDER — ASPIRIN EC 81 MG PO TBEC
81.0000 mg | DELAYED_RELEASE_TABLET | Freq: Every day | ORAL | Status: DC
  Administered 2017-05-07 – 2017-05-18 (×13): 81 mg via ORAL
  Filled 2017-05-06 (×12): qty 1

## 2017-05-06 MED ORDER — DOCUSATE SODIUM 100 MG PO CAPS
100.0000 mg | ORAL_CAPSULE | Freq: Every day | ORAL | Status: DC
  Administered 2017-05-07 – 2017-05-11 (×5): 100 mg via ORAL
  Filled 2017-05-06 (×5): qty 1

## 2017-05-06 MED ORDER — IOPAMIDOL (ISOVUE-300) INJECTION 61%
INTRAVENOUS | Status: AC
  Filled 2017-05-06: qty 30

## 2017-05-06 MED ORDER — SODIUM CHLORIDE 0.9 % IV BOLUS (SEPSIS)
500.0000 mL | Freq: Once | INTRAVENOUS | Status: AC
  Administered 2017-05-06: 500 mL via INTRAVENOUS

## 2017-05-06 NOTE — ED Provider Notes (Signed)
Emergency Department Provider Note   I have reviewed the triage vital signs and the nursing notes.   HISTORY  Chief Complaint Emesis   HPI Cassie Peters is a 83 y.o. female with PMH of HTN, CVA, and CKD presents to the emergency department for evaluation of intractable nausea and vomiting over the last week. The patient was seen early in her illness course in urgent care. She was discharged with Zofran which improved her symptoms transiently. Vomiting has returned and been more persistent over the last 3 days. Denies any blood in the emesis. No abdominal pain or chest pain. Denies difficulty breathing. She has been unable to keep anything down including medications, fluids, food. No sick contacts. She had had some constipation until yesterday when she had a bowel movement. Denies diarrhea. No dysuria, hesitancy, urgency. Vomiting worse with food.   Past Medical History:  Diagnosis Date  . Angio-edema    one episode in 1st week of March 2018 - did not see dr.- son and pt state mouth and tongue swelled so bad she could not talk  . Hypertension   . Stroke [facility]) 2011   right arm and right leg effected  . Thyroid disorder     Patient Active Problem List   Diagnosis Date Noted  . Status post hip surgery 11/03/2016  . Hip fracture ([place]) 11/02/2016  . CKD (chronic kidney disease) stage 3, GFR 30-59 ml/min 11/02/2016  . History of CVA (cerebrovascular accident) 11/02/2016  . Right sided weakness 11/02/2016  . Anemia 11/02/2016  . Thrombocytopenia ([place]) 11/02/2016  . Leukocytosis 11/02/2016  . Nonspecific abnormal electrocardiogram (ECG) (EKG)     Past Surgical History:  Procedure Laterality Date  . ABDOMINAL HYSTERECTOMY     "partial hysterectomy"  . INTRAMEDULLARY (IM) NAIL INTERTROCHANTERIC Right 11/03/2016   Procedure: INTRAMEDULLARY (IM) NAIL INTERTROCHANTRIC;  Surgeon: Paralee Cancel, MD;  Location: [place];  Service: Orthopedics;  Laterality: Right;  . right kidney surgery   approx 2010   "cyst removed from kidney"    Current Outpatient Rx  . Order #: [id number] Class: Historical Med  . Order #: [id number] Class: Historical Med  . Order #: [id number] Class: Historical Med  . Order #: [id number] Class: Historical Med  . Order #: [id number] Class: Historical Med  . Order #: [id number] Class: Normal  . Order #: [id number] Class: No Print  . Order #: [id number] Class: Normal  . Order #: [id number] Class: Historical Med  . Order #: [id number] Class: No Print  . Order #: [id number] Class: Historical Med  . Order #: [id number] Class: Normal  . Order #: [id number] Class: Historical Med  . Order #: [id number] Class: Historical Med  . Order #: [id number] Class: Print  . Order #: [id number] Class: No Print  . Order #: [id number] Class: No Print    Allergies Cardura [doxazosin mesylate]; Clonidine derivatives; and Prinzide [lisinopril-hydrochlorothiazide]  No family history on file.  Social History Social History  Substance Use Topics  . Smoking status: Former Smoker    Years: 20.00    Types: Cigarettes    Quit date: 11/03/1991  . Smokeless tobacco: Never Used     Comment: smoked heavily in the past  . Alcohol use No    Review of Systems  Constitutional: No fever/chills. Positive fatigue and weakness.  Eyes: No visual changes. ENT: No sore throat. Cardiovascular: Denies chest pain. Respiratory: Denies shortness of breath. Gastrointestinal: No abdominal pain. Positive nausea and vomiting.  No diarrhea.  No constipation. Genitourinary: Negative for dysuria. Musculoskeletal: Negative for back pain. Skin: Negative for rash. Neurological: Negative for headaches,  focal weakness or numbness.  10-point ROS otherwise negative.  ____________________________________________   PHYSICAL EXAM:  VITAL SIGNS: ED Triage Vitals [05/06/17 1718]  Enc Vitals Group     BP (!) 190/81     Pulse Rate 73     Resp 16     Temp 97.6 F (36.4 C)     Temp Source Oral     SpO2 100 %     Weight 110 lb  (49.9 kg)     Height 5\' 5"  (1.651 m)   Constitutional: Alert and oriented. No acute distress but appears uncomfortable just when the BP cuff is inflating.  Eyes: Conjunctivae are normal. Head: Atraumatic. Nose: No congestion/rhinnorhea. Mouth/Throat: Mucous membranes are dry.  Neck: No stridor.   Cardiovascular: Normal rate, regular rhythm. Good peripheral circulation. Grossly normal heart sounds.   Respiratory: Normal respiratory effort.  No retractions. Lungs CTAB. Gastrointestinal: Soft and nontender. No distention.  Musculoskeletal: No lower extremity tenderness nor edema. No gross deformities of extremities. Neurologic:  Normal speech and language. No gross focal neurologic deficits are appreciated.  Skin:  Skin is warm, dry and intact. No rash noted.  ____________________________________________   LABS (all labs ordered are listed, but only abnormal results are displayed)  Labs Reviewed  COMPREHENSIVE METABOLIC PANEL - Abnormal; Notable for the following:       Result Value   CO2 18 (*)    Glucose, Bld 112 (*)    BUN 73 (*)    Creatinine, Ser 6.77 (*)    Calcium 8.8 (*)    Total Protein 6.1 (*)    Albumin 3.3 (*)    AST 12 (*)    ALT 6 (*)    GFR calc non Af Amer 5 (*)    GFR calc Af Amer 6 (*)    All other components within normal limits  CBC - Abnormal; Notable for the following:    RBC 3.22 (*)    Hemoglobin 9.0 (*)    HCT 27.5 (*)    Platelets 125 (*)    All other components within normal limits  URINALYSIS, ROUTINE W REFLEX MICROSCOPIC - Abnormal; Notable for the following:    Color, Urine STRAW (*)    Protein, ur >=300 (*)    Bacteria, UA RARE (*)    Squamous Epithelial / LPF 0-5 (*)    All other components within normal limits  LIPASE, BLOOD  I-STAT TROPONIN, ED   ____________________________________________  EKG   EKG Interpretation  Date/Time:  Sunday May 06 2017 19:12:40 EDT Ventricular Rate:  74 PR Interval:    QRS Duration: 87 QT  Interval:  405 QTC Calculation: 450 R Axis:   41 Text Interpretation:  Sinus rhythm Atrial premature complexes Anteroseptal infarct, age indeterminate Baseline wander in lead(s) V2 No STEMI.  Confirmed by Nanda Quinton [phone]) on 05/06/2017 7:15:43 PM       ____________________________________________  RADIOLOGY  Ct Abdomen Pelvis Wo Contrast  Result Date: 05/06/2017 CLINICAL DATA:  Vomiting and abdominal pain for 2 weeks which has worsened over the past 3 days. EXAM: CT ABDOMEN AND PELVIS WITHOUT CONTRAST TECHNIQUE: Multidetector CT imaging of the abdomen and pelvis was performed following the standard protocol without IV contrast. COMPARISON:  MRI abdomen 06/14/2016. FINDINGS: Lower chest: Heart size is upper normal. No pleural or pericardial effusion. Hepatobiliary: Low attenuating lesion in the right hepatic lobe consistent with a cyst is unchanged since the prior MRI. The gallbladder and biliary tree are unremarkable. Pancreas: Unremarkable. No pancreatic ductal dilatation  or surrounding inflammatory changes. Spleen: Normal in size without focal abnormality. Adrenals/Urinary Tract: The patient status post partial right nephrectomy. Lesion in the anterior aspect of the left kidney which measured 1.5 cm on prior MRI measures 2.3 cm today. Other lesions are not grossly changed. Stomach/Bowel: Small hiatal hernia is noted. A few scattered diverticula are seen in an otherwise unremarkable colon. The appendix is not visualized and may have been removed. No evidence of inflammatory process seen. Small bowel is unremarkable. Vascular/Lymphatic: Extensive aortic atherosclerosis is identified. No aneurysm. No lymphadenopathy. Reproductive: Status post hysterectomy. No adnexal masses. Other: No fluid collection. Musculoskeletal: Simple lipoma along the lower right chest is noted. Fracture fixation hardware right hip and femur is identified. Lumbar spondylosis is seen. No acute bony abnormality. IMPRESSION: No  acute abnormality abdomen or pelvis. Complex cyst in the upper pole of the left kidney seen on prior MRI has increased in size. Recommend follow-up with urology. Extensive atherosclerotic vascular disease. Small hiatal hernia. Mild diverticulosis without diverticulitis. Electronically Signed   By: Inge Rise M.D.   On: 05/06/2017 21:52    ____________________________________________   PROCEDURES  Procedure(s) performed:   Procedures  None ____________________________________________   INITIAL IMPRESSION / ASSESSMENT AND PLAN / ED COURSE  Pertinent labs & imaging results that were available during my care of the patient were reviewed by me and considered in my medical decision making (see chart for details).  Patient resents to the emergency room for evaluation of intractable nausea and vomiting. She has no abdominal tenderness on exam. She is frequently belching in complaining of nausea currently. Has been unable to keep down any medications. Denies any chest pain or clear ACS symptoms. Plan for noncontrast CT of the abdomen and pelvis. She has baseline CK ED but creatinine is elevated significantly which I presume is prerenal. Creatinine currently above 6. Giving patient IV fluid bolus along with continuous infusion of fluid while waiting for non-con CT results.  Patient with severely elevated BP. Will give Labetalol IV and reassess. Continue IVF hydration with concern for dehydration.   10:34 PM Discussed patient's case with Hospitalist, Dr. Alcario Drought to request admission. Patient and family (if present) updated with plan. Care transferred to Hospitalist service.  I reviewed all nursing notes, vitals, pertinent old records, EKGs, labs, imaging (as available).  ____________________________________________  FINAL CLINICAL IMPRESSION(S) / ED DIAGNOSES  Final diagnoses:  Non-intractable vomiting with nausea, unspecified vomiting type  AKI (acute kidney injury) ([place])      MEDICATIONS GIVEN DURING THIS VISIT:  Medications  iopamidol (ISOVUE-300) 61 % injection (not administered)  labetalol (NORMODYNE,TRANDATE) injection 10 mg (not administered)  sodium chloride 0.9 % bolus 500 mL (0 mLs Intravenous Stopped 05/06/17 2018)  0.9 %  sodium chloride infusion ( Intravenous New Bag/Given 05/06/17 2057)  metoCLOPramide (REGLAN) injection 10 mg (10 mg Intravenous Given 05/06/17 1940)  labetalol (NORMODYNE,TRANDATE) injection 10 mg (10 mg Intravenous Given 05/06/17 2057)     NEW OUTPATIENT MEDICATIONS STARTED DURING THIS VISIT:  None   Note:  This document was prepared using Dragon voice recognition software and may include unintentional dictation errors.  Nanda Quinton, MD Emergency Medicine    Long, [facility], MD 05/06/17 [phone]

## 2017-05-06 NOTE — H&P (Signed)
History and Physical    Cassie Peters:[id number] DOB: May 17, 1933 DOA: 05/06/2017  PCP: Jonathon Jordan, MD  Patient coming from: Home  I have personally briefly reviewed patient's old medical records in [place]  Chief Complaint: N/V  HPI: Cassie Peters is a 83 y.o. female with medical history significant of accelerated HTN, what appears to be CKD stage 4 borderline 5 at baseline as of March this year.  Patient presents to the ED with c/o N/V for past 4-5 days.  Not able to keep anything including meds down.  Seen at [facility] Friday, given zofran.  Improved slightly but got worse again today.   ED Course: Creat 6.7 up from baseline of 2.7 or so back in March this year, BUN 73, bicarb 18, no anion gap, HGB 9.0.  CT non-contrast of AP is neg.  UA shows proteinuria.  Patient reports no urinary retention.   Review of Systems: As per HPI otherwise 10 point review of systems negative.   Past Medical History:  Diagnosis Date  . Angio-edema    one episode in 1st week of March 2018 - did not see dr.- son and pt state mouth and tongue swelled so bad she could not talk  . Hypertension   . Stroke [facility]) 2011   right arm and right leg effected  . Thyroid disorder     Past Surgical History:  Procedure Laterality Date  . ABDOMINAL HYSTERECTOMY     "partial hysterectomy"  . INTRAMEDULLARY (IM) NAIL INTERTROCHANTERIC Right 11/03/2016   Procedure: INTRAMEDULLARY (IM) NAIL INTERTROCHANTRIC;  Surgeon: Paralee Cancel, MD;  Location: [place];  Service: Orthopedics;  Laterality: Right;  . right kidney surgery  approx 2010   "cyst removed from kidney"     reports that she quit smoking about 25 years ago. Her smoking use included Cigarettes. She quit after 20.00 years of use. She has never used smokeless tobacco. She reports that she does not drink alcohol or use drugs.  Allergies  Allergen Reactions  . Cardura [Doxazosin Mesylate]     Allergic to pill form  . Clonidine Derivatives     Allergic  to pill form  . Prinzide [Lisinopril-Hydrochlorothiazide]     Allergic to pill form    No family history on file.   Prior to Admission medications   Medication Sig Start Date End Date Taking? Authorizing Provider  amLODipine (NORVASC) 10 MG tablet Take 10 mg by mouth daily.   Yes [provider]  aspirin EC 81 MG tablet Take 81 mg by mouth daily.   Yes [provider]  atorvastatin (LIPITOR) 20 MG tablet Take 20 mg by mouth at bedtime.   Yes [provider]  cholecalciferol (VITAMIN D) 1000 units tablet Take 1,000 Units by mouth daily.   Yes [provider]  citalopram (CELEXA) 10 MG tablet Take 10 mg by mouth daily.    Yes [provider]  cloNIDine (CATAPRES) 0.1 MG tablet Take 0.1 mg by mouth daily. 03/26/17  Yes [provider]  docusate sodium (COLACE) 100 MG capsule Take 1 capsule (100 mg total) by mouth 2 (two) times daily. Patient taking differently: Take 100 mg by mouth daily.  11/06/16  Yes Mikhail, [place], DO  ferrous sulfate 325 (65 FE) MG tablet Take 1 tablet (325 mg total) by mouth 2 (two) times daily with a meal. 11/14/16  Yes Ngetich, Dinah C, NP  hydrALAZINE (APRESOLINE) 100 MG tablet Take 1 tablet (100 mg total) by mouth 4 (four)  times daily. Patient taking differently: Take 100 mg by mouth 3 (three) times daily.  11/06/16  Yes Mikhail, Maryann, DO  lisinopril (PRINIVIL,ZESTRIL) 40 MG tablet Take 40 mg by mouth daily. 05/03/17  Yes [provider]  nortriptyline (PAMELOR) 25 MG capsule Take 1 capsule (25 mg total) by mouth at bedtime. 11/06/16  Yes Mikhail, Maryann, DO  pantoprazole (PROTONIX) 40 MG tablet Take 40 mg by mouth daily. For indigestion.    Yes [provider]  polyethylene glycol (MIRALAX / GLYCOLAX) packet Take 17 g by mouth daily as needed for mild constipation. 11/06/16  Yes Mikhail, Velta [place], DO  senna (SENOKOT) 8.6 MG TABS Take 1 tablet by mouth daily as needed. For constipation.   Yes  [provider]    Physical Exam: Vitals:   05/06/17 1945 05/06/17 2030 05/06/17 2130 05/06/17 2230  BP: (!) 209/75 (!) 210/81 (!) 204/77 (!) 195/76  Pulse: 80 84 70 72  Resp: 16 13 16 18   Temp:      TempSrc:      SpO2: 100% 99% 99% 99%  Weight:      Height:        Constitutional: NAD, calm, comfortable Eyes: PERRL, lids and conjunctivae normal ENMT: Mucous membranes are moist. Posterior pharynx clear of any exudate or lesions.Normal dentition.  Neck: normal, supple, no masses, no thyromegaly Respiratory: clear to auscultation bilaterally, no wheezing, no crackles. Normal respiratory effort. No accessory muscle use.  Cardiovascular: Regular rate and rhythm, no murmurs / rubs / gallops. No extremity edema. 2+ pedal pulses. No carotid bruits.  Abdomen: no tenderness, no masses palpated. No hepatosplenomegaly. Bowel sounds positive.  Musculoskeletal: no clubbing / cyanosis. No joint deformity upper and lower extremities. Good ROM, no contractures. Normal muscle tone.  Skin: no rashes, lesions, ulcers. No induration Neurologic: CN 2-12 grossly intact. Sensation intact, DTR normal. Strength 5/5 in all 4.  Psychiatric: Normal judgment and insight. Alert and oriented x 3. Normal mood.    Labs on Admission: I have personally reviewed following labs and imaging studies  CBC:  Recent Labs Lab 05/06/17 1735  WBC 7.4  HGB 9.0*  HCT 27.5*  MCV 85.4  PLT 562*   Basic Metabolic Panel:  Recent Labs Lab 05/06/17 1735  NA 139  K 4.0  CL 111  CO2 18*  GLUCOSE 112*  BUN 73*  CREATININE 6.77*  CALCIUM 8.8*   GFR: Estimated Creatinine Clearance: 5 mL/min (A) (by C-G formula based on SCr of 6.77 mg/dL (H)). Liver Function Tests:  Recent Labs Lab 05/06/17 1735  AST 12*  ALT 6*  ALKPHOS 56  BILITOT 0.7  PROT 6.1*  ALBUMIN 3.3*    Recent Labs Lab 05/06/17 1735  LIPASE 36   No results for input(s): AMMONIA in the last 168 hours. Coagulation Profile: No  results for input(s): INR, PROTIME in the last 168 hours. Cardiac Enzymes: No results for input(s): CKTOTAL, CKMB, CKMBINDEX, TROPONINI in the last 168 hours. BNP (last 3 results) No results for input(s): PROBNP in the last 8760 hours. HbA1C: No results for input(s): HGBA1C in the last 72 hours. CBG: No results for input(s): GLUCAP in the last 168 hours. Lipid Profile: No results for input(s): CHOL, HDL, LDLCALC, TRIG, CHOLHDL, LDLDIRECT in the last 72 hours. Thyroid Function Tests: No results for input(s): TSH, T4TOTAL, FREET4, T3FREE, THYROIDAB in the last 72 hours. Anemia Panel: No results for input(s): VITAMINB12, FOLATE, FERRITIN, TIBC, IRON, RETICCTPCT in the last 72 hours. Urine analysis:    Component  Value Date/Time   COLORURINE STRAW (A) 05/06/2017 2012   APPEARANCEUR CLEAR 05/06/2017 2012   LABSPEC 1.009 05/06/2017 2012   PHURINE 5.0 05/06/2017 2012   GLUCOSEU NEGATIVE 05/06/2017 2012   HGBUR NEGATIVE 05/06/2017 2012   [place] NEGATIVE 05/06/2017 2012   [place] NEGATIVE 05/06/2017 2012   PROTEINUR >=300 (A) 05/06/2017 2012   UROBILINOGEN 0.2 01/01/2012 1745   NITRITE NEGATIVE 05/06/2017 2012   LEUKOCYTESUR NEGATIVE 05/06/2017 2012    Radiological Exams on Admission: Ct Abdomen Pelvis Wo Contrast  Result Date: 05/06/2017 CLINICAL DATA:  Vomiting and abdominal pain for 2 weeks which has worsened over the past 3 days. EXAM: CT ABDOMEN AND PELVIS WITHOUT CONTRAST TECHNIQUE: Multidetector CT imaging of the abdomen and pelvis was performed following the standard protocol without IV contrast. COMPARISON:  MRI abdomen 06/14/2016. FINDINGS: Lower chest: Heart size is upper normal. No pleural or pericardial effusion. Hepatobiliary: Low attenuating lesion in the right hepatic lobe consistent with a cyst is unchanged since the prior MRI. The gallbladder and biliary tree are unremarkable. Pancreas: Unremarkable. No pancreatic ductal dilatation or surrounding inflammatory changes.  Spleen: Normal in size without focal abnormality. Adrenals/Urinary Tract: The patient status post partial right nephrectomy. Lesion in the anterior aspect of the left kidney which measured 1.5 cm on prior MRI measures 2.3 cm today. Other lesions are not grossly changed. Stomach/Bowel: Small hiatal hernia is noted. A few scattered diverticula are seen in an otherwise unremarkable colon. The appendix is not visualized and may have been removed. No evidence of inflammatory process seen. Small bowel is unremarkable. Vascular/Lymphatic: Extensive aortic atherosclerosis is identified. No aneurysm. No lymphadenopathy. Reproductive: Status post hysterectomy. No adnexal masses. Other: No fluid collection. Musculoskeletal: Simple lipoma along the lower right chest is noted. Fracture fixation hardware right hip and femur is identified. Lumbar spondylosis is seen. No acute bony abnormality. IMPRESSION: No acute abnormality abdomen or pelvis. Complex cyst in the upper pole of the left kidney seen on prior MRI has increased in size. Recommend follow-up with urology. Extensive atherosclerotic vascular disease. Small hiatal hernia. Mild diverticulosis without diverticulitis. Electronically Signed   By: Inge Rise M.D.   On: 05/06/2017 21:52    EKG: Independently reviewed.  Assessment/Plan Principal Problem:   Acute renal failure superimposed on stage 4 chronic kidney disease (HCC) Active Problems:   Nausea & vomiting   Hypertensive urgency    1. AKF on CKD stage 4 - secondary to N/V.  Alternatively this presentation could be N/V secondary to progression of CKD to stage 5. 1. Spoke with Dr. Lorrene Reid: 1. Gentle hydration at 75 cc/hr of isotonic bicarb 2. Avoid mass rapid fluid resuscitation given SBPs in the 200s this evening 2. Renal US and bladder scan, r/o obstruction (though admittedly I would have expected to see obstruction on the CT non-contrast we got). 3. Hold nephrotoxic ACEi 4. Strict intake and  output 5. Urine lytes for FeNa 2. HTN urg - 1. Continue home meds 2. Hold nephrotoxic ACEi 3. Labetalol PRN 3. N/V - 1. Zofran PRN  DVT prophylaxis: Heparin [place] Code Status: Full Family Communication: Son at bedside Disposition Plan: Home after admit Consults called: Spoke with Dr. Lorrene Reid, call nephro in AM for formal consult Admission status: Place in obs for now - though I suspect she will be converted to / qualify for inpatient status tomorrow.   Etta Quill DO [facility] Pager [phone]  If 7AM-7PM, please contact day team taking care of patient [email or web address] Password [email or web address]  05/06/2017, 10:53 PM

## 2017-05-06 NOTE — ED Triage Notes (Addendum)
Pt reports vomiting for almost a week. Seen at urgent care, given zofran. Had relief for some days and could eat and drink. Vomiting again on Friday, worse with drinking anything cold. Pt denies abdominal pain.   Pt would not hold her arm still during Blood pressure assessment due to the cuff hurting her*

## 2017-05-06 NOTE — ED Notes (Signed)
Pt in [place] at this time. Angela Nevin, RN attempting to call report at this time. Pt to be transported upstairs after returning from [place].

## 2017-05-07 ENCOUNTER — Encounter (HOSPITAL_COMMUNITY): Payer: Self-pay | Admitting: *Deleted

## 2017-05-07 DIAGNOSIS — I69351 Hemiplegia and hemiparesis following cerebral infarction affecting right dominant side: Secondary | ICD-10-CM | POA: Diagnosis not present

## 2017-05-07 DIAGNOSIS — R112 Nausea with vomiting, unspecified: Secondary | ICD-10-CM | POA: Diagnosis not present

## 2017-05-07 DIAGNOSIS — Y832 Surgical operation with anastomosis, bypass or graft as the cause of abnormal reaction of the patient, or of later complication, without mention of misadventure at the time of the procedure: Secondary | ICD-10-CM | POA: Diagnosis not present

## 2017-05-07 DIAGNOSIS — Z23 Encounter for immunization: Secondary | ICD-10-CM | POA: Diagnosis present

## 2017-05-07 DIAGNOSIS — Z7982 Long term (current) use of aspirin: Secondary | ICD-10-CM | POA: Diagnosis not present

## 2017-05-07 DIAGNOSIS — N185 Chronic kidney disease, stage 5: Secondary | ICD-10-CM | POA: Diagnosis not present

## 2017-05-07 DIAGNOSIS — R64 Cachexia: Secondary | ICD-10-CM | POA: Diagnosis present

## 2017-05-07 DIAGNOSIS — Z85528 Personal history of other malignant neoplasm of kidney: Secondary | ICD-10-CM | POA: Diagnosis not present

## 2017-05-07 DIAGNOSIS — E872 Acidosis: Secondary | ICD-10-CM | POA: Diagnosis present

## 2017-05-07 DIAGNOSIS — T82838A Hemorrhage of vascular prosthetic devices, implants and grafts, initial encounter: Secondary | ICD-10-CM | POA: Diagnosis not present

## 2017-05-07 DIAGNOSIS — D72829 Elevated white blood cell count, unspecified: Secondary | ICD-10-CM | POA: Diagnosis not present

## 2017-05-07 DIAGNOSIS — Z905 Acquired absence of kidney: Secondary | ICD-10-CM | POA: Diagnosis not present

## 2017-05-07 DIAGNOSIS — Z90710 Acquired absence of both cervix and uterus: Secondary | ICD-10-CM | POA: Diagnosis not present

## 2017-05-07 DIAGNOSIS — Z87891 Personal history of nicotine dependence: Secondary | ICD-10-CM | POA: Diagnosis not present

## 2017-05-07 DIAGNOSIS — I160 Hypertensive urgency: Secondary | ICD-10-CM | POA: Diagnosis not present

## 2017-05-07 DIAGNOSIS — R5381 Other malaise: Secondary | ICD-10-CM | POA: Diagnosis not present

## 2017-05-07 DIAGNOSIS — D638 Anemia in other chronic diseases classified elsewhere: Secondary | ICD-10-CM | POA: Diagnosis present

## 2017-05-07 DIAGNOSIS — N179 Acute kidney failure, unspecified: Secondary | ICD-10-CM | POA: Diagnosis present

## 2017-05-07 DIAGNOSIS — I120 Hypertensive chronic kidney disease with stage 5 chronic kidney disease or end stage renal disease: Secondary | ICD-10-CM | POA: Diagnosis present

## 2017-05-07 DIAGNOSIS — D696 Thrombocytopenia, unspecified: Secondary | ICD-10-CM | POA: Diagnosis present

## 2017-05-07 DIAGNOSIS — I10 Essential (primary) hypertension: Secondary | ICD-10-CM | POA: Diagnosis not present

## 2017-05-07 DIAGNOSIS — N186 End stage renal disease: Secondary | ICD-10-CM | POA: Diagnosis not present

## 2017-05-07 DIAGNOSIS — Z79899 Other long term (current) drug therapy: Secondary | ICD-10-CM | POA: Diagnosis not present

## 2017-05-07 DIAGNOSIS — K5900 Constipation, unspecified: Secondary | ICD-10-CM | POA: Diagnosis present

## 2017-05-07 DIAGNOSIS — I161 Hypertensive emergency: Secondary | ICD-10-CM | POA: Diagnosis present

## 2017-05-07 DIAGNOSIS — N184 Chronic kidney disease, stage 4 (severe): Secondary | ICD-10-CM | POA: Diagnosis not present

## 2017-05-07 DIAGNOSIS — Z681 Body mass index (BMI) 19 or less, adult: Secondary | ICD-10-CM | POA: Diagnosis not present

## 2017-05-07 DIAGNOSIS — Z992 Dependence on renal dialysis: Secondary | ICD-10-CM | POA: Diagnosis not present

## 2017-05-07 LAB — BASIC METABOLIC PANEL
Anion gap: 15 (ref 5–15)
BUN: 68 mg/dL — AB (ref 6–20)
CALCIUM: 8.2 mg/dL — AB (ref 8.9–10.3)
CHLORIDE: 112 mmol/L — AB (ref 101–111)
CO2: 12 mmol/L — ABNORMAL LOW (ref 22–32)
CREATININE: 6.31 mg/dL — AB (ref 0.44–1.00)
GFR calc non Af Amer: 6 mL/min — ABNORMAL LOW (ref >60)
GFR, EST AFRICAN AMERICAN: 6 mL/min — AB (ref >60)
Glucose, Bld: 95 mg/dL (ref 65–99)
Potassium: 3.8 mmol/L (ref 3.5–5.1)
SODIUM: 139 mmol/L (ref 135–145)

## 2017-05-07 LAB — NA AND K (SODIUM & POTASSIUM), RAND UR
POTASSIUM UR: 20 mmol/L
SODIUM UR: 75 mmol/L

## 2017-05-07 LAB — CREATININE, URINE, RANDOM: Creatinine, Urine: 66.43 mg/dL

## 2017-05-07 MED ORDER — SODIUM BICARBONATE 650 MG PO TABS
1300.0000 mg | ORAL_TABLET | Freq: Two times a day (BID) | ORAL | Status: DC
  Administered 2017-05-07 – 2017-05-13 (×14): 1300 mg via ORAL
  Filled 2017-05-07 (×14): qty 2

## 2017-05-07 NOTE — Consult Note (Addendum)
[facility] Consult Note     Date: 05/07/2017                  Patient Name:  Cassie Peters  MRN: [id number]  DOB: Aug 31, 1932  Age / Sex: 83 y.o., female         PCP: Jonathon Jordan, MD                 Service Requesting Consult: [facility]- Dr.Vann                 Reason for Consult: Acute on chronic CKD            Chief Complaint: n/v   HPI: Pt is a very pleasant 18 F with a PMH significant for HTN, h/o complex renal cyst, CKD, CVA affecting R side, and nondisplaced intertrochanteric fracture s/p IM nail 10/2016 who is now seen in consultation at the request of Dr. Eliseo Squires for evaluation and recommendations surrounding acute on chronic CKD.  Pt has had n/v for about 4-5 days.  Went to an urgent care where she was given Zofran with partial improvement of symptoms.    It appears that her baseline cr is around 2.5-2.7.  Cr is now 6.7 on admission with a small improvement to 6.3 overnight.  Has a metabolic acidosis.    Renal US with R kidney 9.1 cm L kidney 11.3 cm and marked echogenicity. L renal complex cyst.    UA with 300 mg protein.    She is without complaint right now.  Does not report having seen a nephrologist before ADDEND: however upon review of our outpatient record she follows with Dr. Moshe Cipro.  Last seen in 05/2016 when Cr was 2.53.     Past Medical History:  Diagnosis Date  . Angio-edema    one episode in 1st week of March 2018 - did not see dr.- son and pt state mouth and tongue swelled so bad she could not talk  . Hypertension   . Stroke [facility]) 2011   right arm and right leg effected  . Thyroid disorder     Past Surgical History:  Procedure Laterality Date  . ABDOMINAL HYSTERECTOMY     "partial hysterectomy"  . INTRAMEDULLARY (IM) NAIL INTERTROCHANTERIC Right 11/03/2016   Procedure: INTRAMEDULLARY (IM) NAIL INTERTROCHANTRIC;  Surgeon: Paralee Cancel, MD;  Location: [place];  Service: Orthopedics;  Laterality: Right;  . right kidney surgery  approx  2010   "cyst removed from kidney"    History reviewed. No pertinent family history. Social History:  reports that she quit smoking about 25 years ago. Her smoking use included Cigarettes. She quit after 20.00 years of use. She has never used smokeless tobacco. She reports that she does not drink alcohol or use drugs.  Allergies:  Allergies  Allergen Reactions  . Cardura [Doxazosin Mesylate]     Allergic to pill form  . Clonidine Derivatives     Allergic to pill form  . Prinzide [Lisinopril-Hydrochlorothiazide]     Allergic to pill form    Medications Prior to Admission  Medication Sig Dispense Refill  . amLODipine (NORVASC) 10 MG tablet Take 10 mg by mouth daily.    Marland Kitchen aspirin EC 81 MG tablet Take 81 mg by mouth daily.    Marland Kitchen atorvastatin (LIPITOR) 20 MG tablet Take 20 mg by mouth at bedtime.    . cholecalciferol (VITAMIN D) 1000 units tablet Take 1,000 Units by mouth daily.    . citalopram (CELEXA) 10 MG  tablet Take 10 mg by mouth daily.     . cloNIDine (CATAPRES) 0.1 MG tablet Take 0.1 mg by mouth daily.  1  . docusate sodium (COLACE) 100 MG capsule Take 1 capsule (100 mg total) by mouth 2 (two) times daily. (Patient taking differently: Take 100 mg by mouth daily. ) 10 capsule 0  . ferrous sulfate 325 (65 FE) MG tablet Take 1 tablet (325 mg total) by mouth 2 (two) times daily with a meal.    . hydrALAZINE (APRESOLINE) 100 MG tablet Take 1 tablet (100 mg total) by mouth 4 (four) times daily. (Patient taking differently: Take 100 mg by mouth 3 (three) times daily. ) 120 tablet 0  . lisinopril (PRINIVIL,ZESTRIL) 40 MG tablet Take 40 mg by mouth daily.  2  . nortriptyline (PAMELOR) 25 MG capsule Take 1 capsule (25 mg total) by mouth at bedtime.    . pantoprazole (PROTONIX) 40 MG tablet Take 40 mg by mouth daily. For indigestion.     . polyethylene glycol (MIRALAX / GLYCOLAX) packet Take 17 g by mouth daily as needed for mild constipation. 14 each 0  . senna (SENOKOT) 8.6 MG TABS Take 1  tablet by mouth daily as needed. For constipation.      Results for orders placed or performed during the hospital encounter of 05/06/17 (from the past 48 hour(s))  Lipase, blood     Status: None   Collection Time: 05/06/17  5:35 PM  Result Value Ref Range   Lipase 36 11 - 51 U/L  Comprehensive metabolic panel     Status: Abnormal   Collection Time: 05/06/17  5:35 PM  Result Value Ref Range   Sodium 139 135 - 145 mmol/L   Potassium 4.0 3.5 - 5.1 mmol/L   Chloride 111 101 - 111 mmol/L   CO2 18 (L) 22 - 32 mmol/L   Glucose, Bld 112 (H) 65 - 99 mg/dL   BUN 73 (H) 6 - 20 mg/dL   Creatinine, Ser 6.77 (H) 0.44 - 1.00 mg/dL   Calcium 8.8 (L) 8.9 - 10.3 mg/dL   Total Protein 6.1 (L) 6.5 - 8.1 g/dL   Albumin 3.3 (L) 3.5 - 5.0 g/dL   AST 12 (L) 15 - 41 U/L   ALT 6 (L) 14 - 54 U/L   Alkaline Phosphatase 56 38 - 126 U/L   Total Bilirubin 0.7 0.3 - 1.2 mg/dL   GFR calc non Af Amer 5 (L) >60 mL/min   GFR calc Af Amer 6 (L) >60 mL/min    Comment: (NOTE) The eGFR has been calculated using the CKD EPI equation. This calculation has not been validated in all clinical situations. eGFR's persistently <60 mL/min signify possible Chronic Kidney Disease.    Anion gap 10 5 - 15  CBC     Status: Abnormal   Collection Time: 05/06/17  5:35 PM  Result Value Ref Range   WBC 7.4 4.0 - 10.5 K/uL   RBC 3.22 (L) 3.87 - 5.11 MIL/uL   Hemoglobin 9.0 (L) 12.0 - 15.0 g/dL   HCT 27.5 (L) 36.0 - 46.0 %   MCV 85.4 78.0 - 100.0 fL   MCH 28.0 26.0 - 34.0 pg   MCHC 32.7 30.0 - 36.0 g/dL   RDW 15.2 11.5 - 15.5 %   Platelets 125 (L) 150 - 400 K/uL  I-stat troponin, ED     Status: None   Collection Time: 05/06/17  7:31 PM  Result Value Ref Range   Troponin i,  poc 0.05 0.00 - 0.08 ng/mL   Comment 3            Comment: Due to the release kinetics of cTnI, a negative result within the first hours of the onset of symptoms does not rule out myocardial infarction with certainty. If myocardial infarction is still  suspected, repeat the test at appropriate intervals.   Urinalysis, Routine w reflex microscopic     Status: Abnormal   Collection Time: 05/06/17  8:12 PM  Result Value Ref Range   Color, Urine STRAW (A) YELLOW   APPearance CLEAR CLEAR   Specific Gravity, Urine 1.009 1.005 - 1.030   pH 5.0 5.0 - 8.0   Glucose, UA NEGATIVE NEGATIVE mg/dL   Hgb urine dipstick NEGATIVE NEGATIVE   Bilirubin Urine NEGATIVE NEGATIVE   Ketones, ur NEGATIVE NEGATIVE mg/dL   Protein, ur >=300 (A) NEGATIVE mg/dL   Nitrite NEGATIVE NEGATIVE   Leukocytes, UA NEGATIVE NEGATIVE   RBC / HPF 0-5 0 - 5 RBC/hpf   WBC, UA 0-5 0 - 5 WBC/hpf   Bacteria, UA RARE (A) NONE SEEN   Squamous Epithelial / LPF 0-5 (A) NONE SEEN   Mucus PRESENT   Basic metabolic panel     Status: Abnormal   Collection Time: 05/07/17  2:40 AM  Result Value Ref Range   Sodium 139 135 - 145 mmol/L   Potassium 3.8 3.5 - 5.1 mmol/L   Chloride 112 (H) 101 - 111 mmol/L   CO2 12 (L) 22 - 32 mmol/L   Glucose, Bld 95 65 - 99 mg/dL   BUN 68 (H) 6 - 20 mg/dL   Creatinine, Ser 6.31 (H) 0.44 - 1.00 mg/dL   Calcium 8.2 (L) 8.9 - 10.3 mg/dL   GFR calc non Af Amer 6 (L) >60 mL/min   GFR calc Af Amer 6 (L) >60 mL/min    Comment: (NOTE) The eGFR has been calculated using the CKD EPI equation. This calculation has not been validated in all clinical situations. eGFR's persistently <60 mL/min signify possible Chronic Kidney Disease.    Anion gap 15 5 - 15   Ct Abdomen Pelvis Wo Contrast  Result Date: 05/06/2017 CLINICAL DATA:  Vomiting and abdominal pain for 2 weeks which has worsened over the past 3 days. EXAM: CT ABDOMEN AND PELVIS WITHOUT CONTRAST TECHNIQUE: Multidetector CT imaging of the abdomen and pelvis was performed following the standard protocol without IV contrast. COMPARISON:  MRI abdomen 06/14/2016. FINDINGS: Lower chest: Heart size is upper normal. No pleural or pericardial effusion. Hepatobiliary: Low attenuating lesion in the right hepatic  lobe consistent with a cyst is unchanged since the prior MRI. The gallbladder and biliary tree are unremarkable. Pancreas: Unremarkable. No pancreatic ductal dilatation or surrounding inflammatory changes. Spleen: Normal in size without focal abnormality. Adrenals/Urinary Tract: The patient status post partial right nephrectomy. Lesion in the anterior aspect of the left kidney which measured 1.5 cm on prior MRI measures 2.3 cm today. Other lesions are not grossly changed. Stomach/Bowel: Small hiatal hernia is noted. A few scattered diverticula are seen in an otherwise unremarkable colon. The appendix is not visualized and may have been removed. No evidence of inflammatory process seen. Small bowel is unremarkable. Vascular/Lymphatic: Extensive aortic atherosclerosis is identified. No aneurysm. No lymphadenopathy. Reproductive: Status post hysterectomy. No adnexal masses. Other: No fluid collection. Musculoskeletal: Simple lipoma along the lower right chest is noted. Fracture fixation hardware right hip and femur is identified. Lumbar spondylosis is seen. No acute bony abnormality. IMPRESSION: No  acute abnormality abdomen or pelvis. Complex cyst in the upper pole of the left kidney seen on prior MRI has increased in size. Recommend follow-up with urology. Extensive atherosclerotic vascular disease. Small hiatal hernia. Mild diverticulosis without diverticulitis. Electronically Signed   By: Inge Rise M.D.   On: 05/06/2017 21:52   US Renal  Result Date: 05/07/2017 CLINICAL DATA:  Acute renal failure. Superimposed on stage 4 chronic kidney disease. Previous partial right nephrectomy. Hypertension. EXAM: RENAL / URINARY TRACT ULTRASOUND COMPLETE COMPARISON:  CT abdomen and pelvis 05/06/2017 FINDINGS: Right Kidney: Length: 9.1 cm. Diffusely increased parenchymal echotexture consistent with chronic medical renal disease. No hydronephrosis. Cystic structure with mild internal echotexture demonstrated in the lower  pole measuring 2.3 cm maximal diameter. This corresponds to a septated cyst on prior MRI 06/14/2016. Additional smaller cysts seen in MRI are not identified. Left Kidney: Length: 11.3 cm. Diffusely increased parenchymal echotexture suggesting chronic medical renal disease. No hydronephrosis. Multiple cystic lesions are demonstrated. Along the upper pole, there is a simple appearing cyst measuring 3.3 cm max diameter. In the mid pole, there are 2 adjacent cysts. Mildly complex cyst measuring 2.5 cm diameter and a complex cyst measuring 2.1 cm diameter. These correspond to lesions seen on previous MRI and CT. See recommendations from previous studies. Bladder: No filling defect or bladder wall thickening identified. IMPRESSION: Increased parenchymal echotexture bilaterally consistent with chronic medical renal disease. No hydronephrosis in either kidney. Bilateral renal cysts. Complex cyst demonstrated on the left kidney. See follow-up recommendations from previous CT and MRI examinations. Electronically Signed   By: Lucienne Capers M.D.   On: 05/07/2017 00:05    ROS: all other systems reviewed and are negative  Blood pressure (!) 187/72, pulse 69, temperature 99.3 F (37.4 C), temperature source Oral, resp. rate 19, height _0  (1.651 m), weight 50 kg (110 lb 3.7 oz), SpO2 99 %. Physical Exam  GEN younger than stated age 19 EOMI, PERRL, MMM NECK + JVD to angle of the mandible PULM normal WOB, diminished at bases CV RRR loud S2 ABD soft, nontender, nondistended, NABS EXT no LE edema NEURO R sided weakness SKIN no rashes or lesions  Assessment/Plan 1. Acute on chronic kidney disease: I suspect longstanding HTN.  I'll send off a UP/C.  Based on her renal ultrasound, I don't think there really is anything reversible here.  I discussed with her that she may actually need dialysis soon.  She reports never heard this information before but I suspect she has discussed this with Dr. Moshe Cipro  before.  Per last note, she has been relatively independent despite R sided hemiparesis.  Will try to discuss with son too.  2.  Hypertensive emergency: BP is better- on hydralazine 100 mg TID and labetalol prn.  ? Angioedema episode that she didn't seek medical attn for.  Don't recommend restarting ACEi/ARB (and especially with AKI too).  3.  L complex renal cyst:  Follows with urology, doesn't remember who.  Will get records from [facility] (Dr. Alinda Money)  4.  Metabolic acidosis: nausea better- will stop bicarb gtt and start tablets  5.  Anemia: adding on iron panel and ferritin  6.  Bone/ mineral metabolism: check PTH and Vit D   Madelon Lips, MD [facility] pgr [phone] 05/07/2017, 10:59 AM

## 2017-05-07 NOTE — Progress Notes (Signed)
PROGRESS NOTE    Cassie Peters  RJJ:[id number] DOB: 07-15-1933 DOA: 05/06/2017 PCP: Jonathon Jordan, MD   Outpatient Specialists:    Brief Narrative:  Cassie Peters is a 83 y.o. female with medical history significant of accelerated HTN, what appears to be CKD stage 4 borderline 5 at baseline as of March this year.  Patient presents to the ED with c/o N/V for past 4-5 days.  Not able to keep anything including meds down.  Seen at [facility] Friday, given zofran.  Improved slightly but got worse again today.    Assessment & Plan:   Principal Problem:   Acute renal failure superimposed on stage 4 chronic kidney disease (HCC) Active Problems:   Nausea & vomiting   Hypertensive urgency   AKF on CKD stage 4 -   -presentation could be due to N/V or secondary to progression of CKD to stage 5. -Gentle hydration at 75 cc/hr of isotonic bicarb -protein in urine -Avoid mass rapid fluid resuscitation given SBPs in the 200s this evening -renal consult  -Renal US- medical renal disease  -Hold nephrotoxic ACEi  -Strict intake and output  HTN urg - Continue home meds Hold nephrotoxic ACEi Labetalol PRN  N/V - -Zofran PRN -resolved   DVT prophylaxis:  SQ Heparin  Code Status: Full Code   Family Communication: Pt declined my offer of calling family  Disposition Plan:     Consultants:   renal    Subjective: No current nausea  Objective: Vitals:   05/06/17 2300 05/06/17 2315 05/07/17 0147 05/07/17 0407  BP: (!) 182/60 (!) 183/64 (!) 200/73 (!) 174/88  Pulse: 68 69 71 68  Resp: 14 18 19 20   Temp:   98.9 F (37.2 C) 98.8 F (37.1 C)  TempSrc:   Oral Oral  SpO2: 98% 98% 100% 100%  Weight:   50 kg (110 lb 3.7 oz)   Height:   5\' 5"  (1.651 m)     Intake/Output Summary (Last 24 hours) at 05/07/17 0810 Last data filed at 05/07/17 0645  Gross per 24 hour  Intake              500 ml  Output              200 ml  Net              300 ml   Filed Weights   05/06/17 1718  05/07/17 0147  Weight: 49.9 kg (110 lb) 50 kg (110 lb 3.7 oz)    Examination:  General exam: Appears calm and comfortable- resting in bed Respiratory system: Clear to auscultation. Respiratory effort normal. Cardiovascular system: S1 & S2 heard, RRR. No JVD, murmurs, rubs, gallops or clicks. No pedal edema. Gastrointestinal system: Abdomen is nondistended, soft and nontender. No organomegaly or masses felt. Normal bowel sounds heard. Central nervous system: Alert- No focal neurological deficits. Extremities: moves all 4 ext Skin: No rashes, lesions or ulcers Psychiatry: Judgement and insight appear normal. Mood & affect appropriate.     Data Reviewed: I have personally reviewed following labs and imaging studies  CBC:  Recent Labs Lab 05/06/17 1735  WBC 7.4  HGB 9.0*  HCT 27.5*  MCV 85.4  PLT 016*   Basic Metabolic Panel:  Recent Labs Lab 05/06/17 1735 05/07/17 0240  NA 139 139  K 4.0 3.8  CL 111 112*  CO2 18* 12*  GLUCOSE 112* 95  BUN 73* 68*  CREATININE 6.77* 6.31*  CALCIUM 8.8* 8.2*   GFR:  Estimated Creatinine Clearance: 5.4 mL/min (A) (by C-G formula based on SCr of 6.31 mg/dL (H)). Liver Function Tests:  Recent Labs Lab 05/06/17 1735  AST 12*  ALT 6*  ALKPHOS 56  BILITOT 0.7  PROT 6.1*  ALBUMIN 3.3*    Recent Labs Lab 05/06/17 1735  LIPASE 36   No results for input(s): AMMONIA in the last 168 hours. Coagulation Profile: No results for input(s): INR, PROTIME in the last 168 hours. Cardiac Enzymes: No results for input(s): CKTOTAL, CKMB, CKMBINDEX, TROPONINI in the last 168 hours. BNP (last 3 results) No results for input(s): PROBNP in the last 8760 hours. HbA1C: No results for input(s): HGBA1C in the last 72 hours. CBG: No results for input(s): GLUCAP in the last 168 hours. Lipid Profile: No results for input(s): CHOL, HDL, LDLCALC, TRIG, CHOLHDL, LDLDIRECT in the last 72 hours. Thyroid Function Tests: No results for input(s): TSH,  T4TOTAL, FREET4, T3FREE, THYROIDAB in the last 72 hours. Anemia Panel: No results for input(s): VITAMINB12, FOLATE, FERRITIN, TIBC, IRON, RETICCTPCT in the last 72 hours. Urine analysis:    Component Value Date/Time   COLORURINE STRAW (A) 05/06/2017 2012   APPEARANCEUR CLEAR 05/06/2017 2012   LABSPEC 1.009 05/06/2017 2012   PHURINE 5.0 05/06/2017 2012   GLUCOSEU NEGATIVE 05/06/2017 2012   HGBUR NEGATIVE 05/06/2017 2012   [place] NEGATIVE 05/06/2017 2012   [place] NEGATIVE 05/06/2017 2012   PROTEINUR >=300 (A) 05/06/2017 2012   UROBILINOGEN 0.2 01/01/2012 1745   NITRITE NEGATIVE 05/06/2017 2012   LEUKOCYTESUR NEGATIVE 05/06/2017 2012    )No results found for this or any previous visit (from the past 240 hour(s)).    Anti-infectives    None       Radiology Studies: Ct Abdomen Pelvis Wo Contrast  Result Date: 05/06/2017 CLINICAL DATA:  Vomiting and abdominal pain for 2 weeks which has worsened over the past 3 days. EXAM: CT ABDOMEN AND PELVIS WITHOUT CONTRAST TECHNIQUE: Multidetector CT imaging of the abdomen and pelvis was performed following the standard protocol without IV contrast. COMPARISON:  MRI abdomen 06/14/2016. FINDINGS: Lower chest: Heart size is upper normal. No pleural or pericardial effusion. Hepatobiliary: Low attenuating lesion in the right hepatic lobe consistent with a cyst is unchanged since the prior MRI. The gallbladder and biliary tree are unremarkable. Pancreas: Unremarkable. No pancreatic ductal dilatation or surrounding inflammatory changes. Spleen: Normal in size without focal abnormality. Adrenals/Urinary Tract: The patient status post partial right nephrectomy. Lesion in the anterior aspect of the left kidney which measured 1.5 cm on prior MRI measures 2.3 cm today. Other lesions are not grossly changed. Stomach/Bowel: Small hiatal hernia is noted. A few scattered diverticula are seen in an otherwise unremarkable colon. The appendix is not visualized and  may have been removed. No evidence of inflammatory process seen. Small bowel is unremarkable. Vascular/Lymphatic: Extensive aortic atherosclerosis is identified. No aneurysm. No lymphadenopathy. Reproductive: Status post hysterectomy. No adnexal masses. Other: No fluid collection. Musculoskeletal: Simple lipoma along the lower right chest is noted. Fracture fixation hardware right hip and femur is identified. Lumbar spondylosis is seen. No acute bony abnormality. IMPRESSION: No acute abnormality abdomen or pelvis. Complex cyst in the upper pole of the left kidney seen on prior MRI has increased in size. Recommend follow-up with urology. Extensive atherosclerotic vascular disease. Small hiatal hernia. Mild diverticulosis without diverticulitis. Electronically Signed   By: Inge Rise M.D.   On: 05/06/2017 21:52   US Renal  Result Date: 05/07/2017 CLINICAL DATA:  Acute renal failure. Superimposed  on stage 4 chronic kidney disease. Previous partial right nephrectomy. Hypertension. EXAM: RENAL / URINARY TRACT ULTRASOUND COMPLETE COMPARISON:  CT abdomen and pelvis 05/06/2017 FINDINGS: Right Kidney: Length: 9.1 cm. Diffusely increased parenchymal echotexture consistent with chronic medical renal disease. No hydronephrosis. Cystic structure with mild internal echotexture demonstrated in the lower pole measuring 2.3 cm maximal diameter. This corresponds to a septated cyst on prior MRI 06/14/2016. Additional smaller cysts seen in MRI are not identified. Left Kidney: Length: 11.3 cm. Diffusely increased parenchymal echotexture suggesting chronic medical renal disease. No hydronephrosis. Multiple cystic lesions are demonstrated. Along the upper pole, there is a simple appearing cyst measuring 3.3 cm max diameter. In the mid pole, there are 2 adjacent cysts. Mildly complex cyst measuring 2.5 cm diameter and a complex cyst measuring 2.1 cm diameter. These correspond to lesions seen on previous MRI and CT. See  recommendations from previous studies. Bladder: No filling defect or bladder wall thickening identified. IMPRESSION: Increased parenchymal echotexture bilaterally consistent with chronic medical renal disease. No hydronephrosis in either kidney. Bilateral renal cysts. Complex cyst demonstrated on the left kidney. See follow-up recommendations from previous CT and MRI examinations. Electronically Signed   By: Lucienne Capers M.D.   On: 05/07/2017 00:05        Scheduled Meds: . amLODipine  10 mg Oral Daily  . aspirin EC  81 mg Oral Daily  . atorvastatin  20 mg Oral QHS  . citalopram  10 mg Oral Daily  . docusate sodium  100 mg Oral Daily  . ferrous sulfate  325 mg Oral BID WC  . heparin  5,000 Units Subcutaneous Q8H  . hydrALAZINE  100 mg Oral TID  . nortriptyline  25 mg Oral QHS  . pantoprazole  40 mg Oral Daily   Continuous Infusions: .  sodium bicarbonate (isotonic) infusion in sterile water 75 mL/hr at 05/07/17 0302     LOS: 0 days    Time spent: 35 min    [place], DO [facility] Pager [phone]  If 7PM-7AM, please contact night-coverage [email or web address] Password [email or web address] 05/07/2017, 8:10 AM

## 2017-05-07 NOTE — Care Management Note (Signed)
Case Management Note  Patient Details  Name: Cassie Peters MRN: [id number] Date of Birth: Jul 22, 1933  Subjective/Objective:     CM following for progression and d/c planning.                Action/Plan: 05/07/2017 Met with pt OBS status discussed. Pt son lives with her and he and her niece are very supportive per pt. Pt uses walker and cane at home.   Expected Discharge Date:  05/10/17               Expected Discharge Plan:  Home/Self Care  In-House Referral:  NA  Discharge planning Services  CM Consult  Post Acute Care Choice:    Choice offered to:     DME Arranged:    DME Agency:     HH Arranged:    HH Agency:     Status of Service:  In process, will continue to follow  If discussed at Long Length of Stay Meetings, dates discussed:    Additional Comments:  Adron Bene, RN 05/07/2017, 10:47 AM

## 2017-05-07 NOTE — Care Management Obs Status (Signed)
MEDICARE OBSERVATION STATUS NOTIFICATION   Patient Details  Name: Cassie Peters MRN: [id number] Date of Birth: 01-23-1933   Medicare Observation Status Notification Given:  Yes    Helio Lack, Rory Percy, RN 05/07/2017, 10:46 AM

## 2017-05-08 LAB — CBC
HEMATOCRIT: 25.4 % — AB (ref 36.0–46.0)
HEMOGLOBIN: 8.1 g/dL — AB (ref 12.0–15.0)
MCH: 27.1 pg (ref 26.0–34.0)
MCHC: 31.9 g/dL (ref 30.0–36.0)
MCV: 84.9 fL (ref 78.0–100.0)
Platelets: 122 10*3/uL — ABNORMAL LOW (ref 150–400)
RBC: 2.99 MIL/uL — AB (ref 3.87–5.11)
RDW: 14.9 % (ref 11.5–15.5)
WBC: 7.8 10*3/uL (ref 4.0–10.5)

## 2017-05-08 LAB — RENAL FUNCTION PANEL
ANION GAP: 10 (ref 5–15)
Albumin: 2.8 g/dL — ABNORMAL LOW (ref 3.5–5.0)
BUN: 64 mg/dL — ABNORMAL HIGH (ref 6–20)
CHLORIDE: 108 mmol/L (ref 101–111)
CO2: 22 mmol/L (ref 22–32)
Calcium: 8.2 mg/dL — ABNORMAL LOW (ref 8.9–10.3)
Creatinine, Ser: 6.03 mg/dL — ABNORMAL HIGH (ref 0.44–1.00)
GFR, EST AFRICAN AMERICAN: 7 mL/min — AB (ref >60)
GFR, EST NON AFRICAN AMERICAN: 6 mL/min — AB (ref >60)
Glucose, Bld: 79 mg/dL (ref 65–99)
PHOSPHORUS: 4.7 mg/dL — AB (ref 2.5–4.6)
POTASSIUM: 3.8 mmol/L (ref 3.5–5.1)
Sodium: 140 mmol/L (ref 135–145)

## 2017-05-08 LAB — IRON AND TIBC
IRON: 42 ug/dL (ref 28–170)
SATURATION RATIOS: 23 % (ref 10.4–31.8)
TIBC: 179 ug/dL — AB (ref 250–450)
UIBC: 137 ug/dL

## 2017-05-08 LAB — FERRITIN: FERRITIN: 327 ng/mL — AB (ref 11–307)

## 2017-05-08 NOTE — Progress Notes (Signed)
PROGRESS NOTE    Cassie Peters  UUV:[id number] DOB: [birth year] DOA: 05/06/2017 PCP: Jonathon Jordan, MD   Outpatient Specialists:    Brief Narrative:  Cassie Peters is a 83 y.o. female with medical history significant of accelerated HTN, what appears to be CKD stage 4 borderline 5 at baseline as of March this year.  Patient presents to the ED with c/o N/V for past 4-5 days.     Assessment & Plan:   Principal Problem:   Acute renal failure superimposed on stage 4 chronic kidney disease (HCC) Active Problems:   Nausea & vomiting   Hypertensive urgency   AKF on CKD stage 4 -  -renal consult appreciated: most likely non-reversible-- patient and son to discuss dialysis  -Renal US- medical renal disease  -d/c nephrotoxic ACEi  -Strict intake and output  HTN urg - improve Hold nephrotoxic ACEi Labetalol PRN  N/V - -Zofran PRN -resolved   DVT prophylaxis:  SQ Heparin  Code Status: Full Code   Family Communication: Pt declined my offer of calling family- son was out of town  Disposition Plan:     Consultants:   renal    Subjective: Eating breakfast  Objective: Vitals:   05/07/17 2028 05/08/17 0021 05/08/17 0423 05/08/17 1000  BP: (!) 194/88 (!) 196/74 (!) 177/72 (!) 170/79  Pulse: 88 88 85 80  Resp: 16   18  Temp: 97.6 F (36.4 C)  99.1 F (37.3 C) 98 F (36.7 C)  TempSrc:    Oral  SpO2: 98%  96% 98%  Weight: 52.6 kg (116 lb)     Height:        Intake/Output Summary (Last 24 hours) at 05/08/17 1204 Last data filed at 05/08/17 0900  Gross per 24 hour  Intake              480 ml  Output              375 ml  Net              105 ml   Filed Weights   05/06/17 1718 05/07/17 0147 05/07/17 2028  Weight: 49.9 kg (110 lb) 50 kg (110 lb 3.7 oz) 52.6 kg (116 lb)    Examination:  General exam: In bed, eating breakfast Respiratory system: no wheezing Cardiovascular system: No pedal edema. Gastrointestinal system: +BS, soft Central nervous  system: alert and pleasant Extremities: right sides weaknes Skin: No rashes, lesions or ulcers     Data Reviewed: I have personally reviewed following labs and imaging studies  CBC:  Recent Labs Lab 05/06/17 1735 05/08/17 0520  WBC 7.4 7.8  HGB 9.0* 8.1*  HCT 27.5* 25.4*  MCV 85.4 84.9  PLT 125* 474*   Basic Metabolic Panel:  Recent Labs Lab 05/06/17 1735 05/07/17 0240 05/08/17 0520  NA 139 139 140  K 4.0 3.8 3.8  CL 111 112* 108  CO2 18* 12* 22  GLUCOSE 112* 95 79  BUN 73* 68* 64*  CREATININE 6.77* 6.31* 6.03*  CALCIUM 8.8* 8.2* 8.2*  PHOS  --   --  4.7*   GFR: Estimated Creatinine Clearance: 6 mL/min (A) (by C-G formula based on SCr of 6.03 mg/dL (H)). Liver Function Tests:  Recent Labs Lab 05/06/17 1735 05/08/17 0520  AST 12*  --   ALT 6*  --   ALKPHOS 56  --   BILITOT 0.7  --   PROT 6.1*  --   ALBUMIN 3.3* 2.8*    Recent  Labs Lab 05/06/17 1735  LIPASE 36   No results for input(s): AMMONIA in the last 168 hours. Coagulation Profile: No results for input(s): INR, PROTIME in the last 168 hours. Cardiac Enzymes: No results for input(s): CKTOTAL, CKMB, CKMBINDEX, TROPONINI in the last 168 hours. BNP (last 3 results) No results for input(s): PROBNP in the last 8760 hours. HbA1C: No results for input(s): HGBA1C in the last 72 hours. CBG: No results for input(s): GLUCAP in the last 168 hours. Lipid Profile: No results for input(s): CHOL, HDL, LDLCALC, TRIG, CHOLHDL, LDLDIRECT in the last 72 hours. Thyroid Function Tests: No results for input(s): TSH, T4TOTAL, FREET4, T3FREE, THYROIDAB in the last 72 hours. Anemia Panel:  Recent Labs  05/08/17 0520  FERRITIN 327*  TIBC 179*  IRON 42   Urine analysis:    Component Value Date/Time   COLORURINE STRAW (A) 05/06/2017 2012   APPEARANCEUR CLEAR 05/06/2017 2012   LABSPEC 1.009 05/06/2017 2012   PHURINE 5.0 05/06/2017 2012   GLUCOSEU NEGATIVE 05/06/2017 2012   HGBUR NEGATIVE 05/06/2017 2012    [place] NEGATIVE 05/06/2017 2012   [place] NEGATIVE 05/06/2017 2012   PROTEINUR >=300 (A) 05/06/2017 2012   UROBILINOGEN 0.2 01/01/2012 1745   NITRITE NEGATIVE 05/06/2017 2012   LEUKOCYTESUR NEGATIVE 05/06/2017 2012    )No results found for this or any previous visit (from the past 240 hour(s)).    Anti-infectives    None       Radiology Studies: Ct Abdomen Pelvis Wo Contrast  Result Date: 05/06/2017 CLINICAL DATA:  Vomiting and abdominal pain for 2 weeks which has worsened over the past 3 days. EXAM: CT ABDOMEN AND PELVIS WITHOUT CONTRAST TECHNIQUE: Multidetector CT imaging of the abdomen and pelvis was performed following the standard protocol without IV contrast. COMPARISON:  MRI abdomen 06/14/2016. FINDINGS: Lower chest: Heart size is upper normal. No pleural or pericardial effusion. Hepatobiliary: Low attenuating lesion in the right hepatic lobe consistent with a cyst is unchanged since the prior MRI. The gallbladder and biliary tree are unremarkable. Pancreas: Unremarkable. No pancreatic ductal dilatation or surrounding inflammatory changes. Spleen: Normal in size without focal abnormality. Adrenals/Urinary Tract: The patient status post partial right nephrectomy. Lesion in the anterior aspect of the left kidney which measured 1.5 cm on prior MRI measures 2.3 cm today. Other lesions are not grossly changed. Stomach/Bowel: Small hiatal hernia is noted. A few scattered diverticula are seen in an otherwise unremarkable colon. The appendix is not visualized and may have been removed. No evidence of inflammatory process seen. Small bowel is unremarkable. Vascular/Lymphatic: Extensive aortic atherosclerosis is identified. No aneurysm. No lymphadenopathy. Reproductive: Status post hysterectomy. No adnexal masses. Other: No fluid collection. Musculoskeletal: Simple lipoma along the lower right chest is noted. Fracture fixation hardware right hip and femur is identified. Lumbar spondylosis  is seen. No acute bony abnormality. IMPRESSION: No acute abnormality abdomen or pelvis. Complex cyst in the upper pole of the left kidney seen on prior MRI has increased in size. Recommend follow-up with urology. Extensive atherosclerotic vascular disease. Small hiatal hernia. Mild diverticulosis without diverticulitis. Electronically Signed   By: Inge Rise M.D.   On: 05/06/2017 21:52   US Renal  Result Date: 05/07/2017 CLINICAL DATA:  Acute renal failure. Superimposed on stage 4 chronic kidney disease. Previous partial right nephrectomy. Hypertension. EXAM: RENAL / URINARY TRACT ULTRASOUND COMPLETE COMPARISON:  CT abdomen and pelvis 05/06/2017 FINDINGS: Right Kidney: Length: 9.1 cm. Diffusely increased parenchymal echotexture consistent with chronic medical renal disease. No hydronephrosis. Cystic structure  with mild internal echotexture demonstrated in the lower pole measuring 2.3 cm maximal diameter. This corresponds to a septated cyst on prior MRI 06/14/2016. Additional smaller cysts seen in MRI are not identified. Left Kidney: Length: 11.3 cm. Diffusely increased parenchymal echotexture suggesting chronic medical renal disease. No hydronephrosis. Multiple cystic lesions are demonstrated. Along the upper pole, there is a simple appearing cyst measuring 3.3 cm max diameter. In the mid pole, there are 2 adjacent cysts. Mildly complex cyst measuring 2.5 cm diameter and a complex cyst measuring 2.1 cm diameter. These correspond to lesions seen on previous MRI and CT. See recommendations from previous studies. Bladder: No filling defect or bladder wall thickening identified. IMPRESSION: Increased parenchymal echotexture bilaterally consistent with chronic medical renal disease. No hydronephrosis in either kidney. Bilateral renal cysts. Complex cyst demonstrated on the left kidney. See follow-up recommendations from previous CT and MRI examinations. Electronically Signed   By: Lucienne Capers M.D.   On:  05/07/2017 00:05        Scheduled Meds: . amLODipine  10 mg Oral Daily  . aspirin EC  81 mg Oral Daily  . atorvastatin  20 mg Oral QHS  . citalopram  10 mg Oral Daily  . docusate sodium  100 mg Oral Daily  . ferrous sulfate  325 mg Oral BID WC  . heparin  5,000 Units Subcutaneous Q8H  . hydrALAZINE  100 mg Oral TID  . nortriptyline  25 mg Oral QHS  . pantoprazole  40 mg Oral Daily  . sodium bicarbonate  1,300 mg Oral BID   Continuous Infusions:    LOS: 1 day    Time spent: 25 min    [place], DO [facility] Pager [phone]  If 7PM-7AM, please contact night-coverage [email or web address] Password [facility] 05/08/2017, 12:04 PM

## 2017-05-08 NOTE — Evaluation (Signed)
Physical Therapy Evaluation Patient Details Name: Cassie Peters MRN: [id number] DOB: Aug 05, 1933 Today's Date: 05/08/2017   History of Present Illness  Cassie Dall Foulksis a 83 y.o.femalewith medical history significant of accelerated HTN, CVA with R hemiparesis, Fall in March with fracture, what appears to be CKD stage 4 borderline 5 at baseline as of March this year. Patient presents to the ED with c/o N/V for past 4-5 days.   Acute renal failure superimposed on stage 4 chronic kidney disease ([place]), N/v, Hypertensive urgency  Clinical Impression  Pt admitted with above diagnosis. Pt currently with functional limitations due to the deficits listed below (see PT Problem List). [name] presents with gait and balance dysfunction, and incr fall risk; She is very motivated to move better; We discussed HHPT follow up, and she indicated that they did not help her that much; I recommend Outpt PT, specifically Neurorehab, to address impariments and maximize movement coordination, strengthening;  Pt will benefit from skilled PT to increase their independence and safety with mobility to allow discharge to the venue listed below.       Follow Up Recommendations Outpatient PT    Equipment Recommendations  None recommended by PT    Recommendations for Other Services OT consult (order placed)     Precautions / Restrictions Precautions Precautions: Fall      Mobility  Bed Mobility                  Transfers Overall transfer level: Needs assistance Equipment used: 1 person hand held assist Transfers: Sit to/from Stand Sit to Stand: Mod assist         General transfer comment: Light mod assist to power up and steady  Ambulation/Gait Ambulation/Gait assistance: Mod assist;Min assist Ambulation Distance (Feet): 20 Feet Assistive device: 1 person hand held assist;Rolling walker (2 wheeled) Gait Pattern/deviations: Step-to pattern;Decreased stance time - left;Decreased step length -  left;Decreased weight shift to right Gait velocity: slowed Gait velocity interpretation: Below normal speed for age/gender General Gait Details: presents with hemiparetic gait, noting flexion synergy to advance RLE and heavy dependence on LUE support when in R stance and advancing L foot; tends to scissor, putting pt at high fall risk  Stairs            Wheelchair Mobility    Modified Rankin (Stroke Patients Only)       Balance Overall balance assessment: Needs assistance;History of Falls   Sitting balance-Leahy Scale: Good       Standing balance-Leahy Scale: Poor                               Pertinent Vitals/Pain Pain Assessment: No/denies pain    Home Living Family/patient expects to be discharged to:: Private residence Living Arrangements: Children Available Help at Discharge: Family;Available 24 hours/day Type of Home: House Home Access: Stairs to enter Entrance Stairs-Rails: None Entrance Stairs-Number of Steps: 3 Home Layout: One level Home Equipment: Bedside commode;Walker - 2 wheels;Cane - single point;Shower seat - built in;Hand held shower head;Adaptive equipment;Cane - quad      Prior Function Level of Independence: Needs assistance;Independent with assistive device(s)   Gait / Transfers Assistance Needed: used [place] for mobility and needed help up front steps  ADL's / Homemaking Assistance Needed: son helps with drying off her back and fastening her bra, son does all cooking  Comments: Son assists with all Adls and IADLs     Hand Dominance  Dominant Hand: Left    Extremity/Trunk Assessment   Upper Extremity Assessment Upper Extremity Assessment: Defer to OT evaluation (REsidual R UE hemiparesis)    Lower Extremity Assessment Lower Extremity Assessment: RLE deficits/detail RLE Deficits / Details: Grossly at least 3/5 strength throughout; Noting decr coordination and scissoring at times RLE Coordination: decreased gross  motor;decreased fine motor       Communication   Communication: No difficulties  Cognition Arousal/Alertness: Awake/alert Behavior During Therapy: WFL for tasks assessed/performed Overall Cognitive Status: Within Functional Limits for tasks assessed                                        General Comments      Exercises     Assessment/Plan    PT Assessment Patient needs continued PT services  PT Problem List Decreased strength;Decreased range of motion;Decreased activity tolerance;Decreased balance;Decreased mobility;Decreased coordination;Decreased knowledge of use of DME;Decreased knowledge of precautions       PT Treatment Interventions DME instruction;Gait training;Stair training;Functional mobility training;Therapeutic activities;Therapeutic exercise;Neuromuscular re-education;Patient/family education    PT Goals (Current goals can be found in the Care Plan section)  Acute Rehab PT Goals Patient Stated Goal: REALLY wants to be safe and independent PT Goal Formulation: With patient Time For Goal Achievement: 05/22/17 Potential to Achieve Goals: Good    Frequency Min 3X/week   Barriers to discharge        Co-evaluation               AM-PAC PT "6 Clicks" Daily Activity  Outcome Measure Difficulty turning over in bed (including adjusting bedclothes, sheets and blankets)?: A Little Difficulty moving from lying on back to sitting on the side of the bed? : A Little Difficulty sitting down on and standing up from a chair with arms (e.g., wheelchair, bedside commode, etc,.)?: Unable Help needed moving to and from a bed to chair (including a wheelchair)?: A Lot Help needed walking in hospital room?: A Lot Help needed climbing 3-5 steps with a railing? : A Lot 6 Click Score: 13    End of Session Equipment Utilized During Treatment: Gait belt Activity Tolerance: Patient tolerated treatment well Patient left: in chair;with call bell/phone within  reach Nurse Communication: Mobility status PT Visit Diagnosis: Unsteadiness on feet (R26.81);Other symptoms and signs involving the nervous system (R29.898);Hemiplegia and hemiparesis Hemiplegia - Right/Left: Right Hemiplegia - dominant/non-dominant: Non-dominant Hemiplegia - caused by: Cerebral infarction    Time: 4917-9150 PT Time Calculation (min) (ACUTE ONLY): 14 min   Charges:   PT Evaluation $PT Eval Low Complexity: 1 Low     PT G Codes:        Roney [place], PT  Acute Rehabilitation Services Pager [phone] Office [phone]   [place] 05/08/2017, 4:01 PM

## 2017-05-08 NOTE — Progress Notes (Signed)
[facility] Progress Note    Assessment/ Plan:   1. Acute on chronic kidney disease: I suspect longstanding HTN; also s/p R partial nephrectomy 2009.  Follows with Dr. Moshe Cipro.  I'll send off a UP/C.  Based on her renal ultrasound, I don't think there really is anything reversible here.  Per last note from [place] records, she has been relatively independent despite R sided hemiparesis.  I had a more detailed conversation about dialysis with her today.  Fortunately Cr is slightly improving so don't necessarily need to start here in the hospital.  She is still deciding whether or not she would want to do dialysis.  Her son is coming today so she will speak with him about this.  If she decides will vein map and c/s VVS.  2.  Hypertensive emergency: BP is better- on hydralazine 100 mg TID and labetalol prn.  ? Angioedema episode that she didn't seek medical attn for.  Don't recommend restarting ACEi/ARB (and especially with AKI too).  3.  L complex renal cyst:  Follows with urology- Dr. Alinda Money.  Has been conservatively managed so far.  4.  Metabolic acidosis: improved on bicarb tabs  5.  Anemia: on oral iron, Hgb drifted slightly downwards, monitor.  6.  Bone/ mineral metabolism: check PTH and Vit D  Subjective:    Feeling well.  No complaints currently.    Objective:   BP (!) 170/79 (BP Location: Left Leg)   Pulse 80   Temp 98 F (36.7 C) (Oral)   Resp 18   Ht 5\' 5"  (1.651 m)   Wt 52.6 kg (116 lb)   SpO2 98%   BMI 19.30 kg/m   Intake/Output Summary (Last 24 hours) at 05/08/17 1140 Last data filed at 05/08/17 0900  Gross per 24 hour  Intake              480 ml  Output              375 ml  Net              105 ml   Weight change: 2.722 kg (6 lb)  Physical Exam: GEN younger than stated age, sitting in chair HEENT EOMI, PERRL, MMM NECK JVD improved PULM normal WOB, clear CV RRR loud S2 ABD soft, nontender, nondistended, NABS EXT no LE edema NEURO R  sided weakness, R facial droop SKIN no rashes or lesions  Imaging: Ct Abdomen Pelvis Wo Contrast  Result Date: 05/06/2017 CLINICAL DATA:  Vomiting and abdominal pain for 2 weeks which has worsened over the past 3 days. EXAM: CT ABDOMEN AND PELVIS WITHOUT CONTRAST TECHNIQUE: Multidetector CT imaging of the abdomen and pelvis was performed following the standard protocol without IV contrast. COMPARISON:  MRI abdomen 06/14/2016. FINDINGS: Lower chest: Heart size is upper normal. No pleural or pericardial effusion. Hepatobiliary: Low attenuating lesion in the right hepatic lobe consistent with a cyst is unchanged since the prior MRI. The gallbladder and biliary tree are unremarkable. Pancreas: Unremarkable. No pancreatic ductal dilatation or surrounding inflammatory changes. Spleen: Normal in size without focal abnormality. Adrenals/Urinary Tract: The patient status post partial right nephrectomy. Lesion in the anterior aspect of the left kidney which measured 1.5 cm on prior MRI measures 2.3 cm today. Other lesions are not grossly changed. Stomach/Bowel: Small hiatal hernia is noted. A few scattered diverticula are seen in an otherwise unremarkable colon. The appendix is not visualized and may have been removed. No evidence of inflammatory process seen. Small  bowel is unremarkable. Vascular/Lymphatic: Extensive aortic atherosclerosis is identified. No aneurysm. No lymphadenopathy. Reproductive: Status post hysterectomy. No adnexal masses. Other: No fluid collection. Musculoskeletal: Simple lipoma along the lower right chest is noted. Fracture fixation hardware right hip and femur is identified. Lumbar spondylosis is seen. No acute bony abnormality. IMPRESSION: No acute abnormality abdomen or pelvis. Complex cyst in the upper pole of the left kidney seen on prior MRI has increased in size. Recommend follow-up with urology. Extensive atherosclerotic vascular disease. Small hiatal hernia. Mild diverticulosis without  diverticulitis. Electronically Signed   By: Inge Rise M.D.   On: 05/06/2017 21:52   US Renal  Result Date: 05/07/2017 CLINICAL DATA:  Acute renal failure. Superimposed on stage 4 chronic kidney disease. Previous partial right nephrectomy. Hypertension. EXAM: RENAL / URINARY TRACT ULTRASOUND COMPLETE COMPARISON:  CT abdomen and pelvis 05/06/2017 FINDINGS: Right Kidney: Length: 9.1 cm. Diffusely increased parenchymal echotexture consistent with chronic medical renal disease. No hydronephrosis. Cystic structure with mild internal echotexture demonstrated in the lower pole measuring 2.3 cm maximal diameter. This corresponds to a septated cyst on prior MRI 06/14/2016. Additional smaller cysts seen in MRI are not identified. Left Kidney: Length: 11.3 cm. Diffusely increased parenchymal echotexture suggesting chronic medical renal disease. No hydronephrosis. Multiple cystic lesions are demonstrated. Along the upper pole, there is a simple appearing cyst measuring 3.3 cm max diameter. In the mid pole, there are 2 adjacent cysts. Mildly complex cyst measuring 2.5 cm diameter and a complex cyst measuring 2.1 cm diameter. These correspond to lesions seen on previous MRI and CT. See recommendations from previous studies. Bladder: No filling defect or bladder wall thickening identified. IMPRESSION: Increased parenchymal echotexture bilaterally consistent with chronic medical renal disease. No hydronephrosis in either kidney. Bilateral renal cysts. Complex cyst demonstrated on the left kidney. See follow-up recommendations from previous CT and MRI examinations. Electronically Signed   By: Lucienne Capers M.D.   On: 05/07/2017 00:05    Labs: BMET  Recent Labs Lab 05/06/17 1735 05/07/17 0240 05/08/17 0520  NA 139 139 140  K 4.0 3.8 3.8  CL 111 112* 108  CO2 18* 12* 22  GLUCOSE 112* 95 79  BUN 73* 68* 64*  CREATININE 6.77* 6.31* 6.03*  CALCIUM 8.8* 8.2* 8.2*  PHOS  --   --  4.7*   CBC  Recent  Labs Lab 05/06/17 1735 05/08/17 0520  WBC 7.4 7.8  HGB 9.0* 8.1*  HCT 27.5* 25.4*  MCV 85.4 84.9  PLT 125* 122*    Medications:    . amLODipine  10 mg Oral Daily  . aspirin EC  81 mg Oral Daily  . atorvastatin  20 mg Oral QHS  . citalopram  10 mg Oral Daily  . docusate sodium  100 mg Oral Daily  . ferrous sulfate  325 mg Oral BID WC  . heparin  5,000 Units Subcutaneous Q8H  . hydrALAZINE  100 mg Oral TID  . nortriptyline  25 mg Oral QHS  . pantoprazole  40 mg Oral Daily  . sodium bicarbonate  1,300 mg Oral BID      Madelon Lips, MD [place] pgr [phone] 05/08/2017, 11:40 AM

## 2017-05-09 DIAGNOSIS — I10 Essential (primary) hypertension: Secondary | ICD-10-CM

## 2017-05-09 LAB — RENAL FUNCTION PANEL
Albumin: 3.2 g/dL — ABNORMAL LOW (ref 3.5–5.0)
Anion gap: 9 (ref 5–15)
BUN: 58 mg/dL — ABNORMAL HIGH (ref 6–20)
CALCIUM: 8.4 mg/dL — AB (ref 8.9–10.3)
CO2: 23 mmol/L (ref 22–32)
CREATININE: 5.93 mg/dL — AB (ref 0.44–1.00)
Chloride: 109 mmol/L (ref 101–111)
GFR, EST AFRICAN AMERICAN: 7 mL/min — AB (ref >60)
GFR, EST NON AFRICAN AMERICAN: 6 mL/min — AB (ref >60)
Glucose, Bld: 107 mg/dL — ABNORMAL HIGH (ref 65–99)
Phosphorus: 4.1 mg/dL (ref 2.5–4.6)
Potassium: 3.6 mmol/L (ref 3.5–5.1)
Sodium: 141 mmol/L (ref 135–145)

## 2017-05-09 LAB — CBC
HCT: 28.3 % — ABNORMAL LOW (ref 36.0–46.0)
Hemoglobin: 9 g/dL — ABNORMAL LOW (ref 12.0–15.0)
MCH: 26.9 pg (ref 26.0–34.0)
MCHC: 31.8 g/dL (ref 30.0–36.0)
MCV: 84.5 fL (ref 78.0–100.0)
PLATELETS: 137 10*3/uL — AB (ref 150–400)
RBC: 3.35 MIL/uL — AB (ref 3.87–5.11)
RDW: 14.7 % (ref 11.5–15.5)
WBC: 10.4 10*3/uL (ref 4.0–10.5)

## 2017-05-09 LAB — PARATHYROID HORMONE, INTACT (NO CA): PTH: 288 pg/mL — AB (ref 15–65)

## 2017-05-09 LAB — VITAMIN D 25 HYDROXY (VIT D DEFICIENCY, FRACTURES): Vit D, 25-Hydroxy: 18.6 ng/mL — ABNORMAL LOW (ref 30.0–100.0)

## 2017-05-09 MED ORDER — LABETALOL HCL 200 MG PO TABS
200.0000 mg | ORAL_TABLET | Freq: Two times a day (BID) | ORAL | Status: DC
  Administered 2017-05-09 – 2017-05-16 (×13): 200 mg via ORAL
  Filled 2017-05-09 (×13): qty 1

## 2017-05-09 MED ORDER — INFLUENZA VAC SPLIT HIGH-DOSE 0.5 ML IM SUSY
0.5000 mL | PREFILLED_SYRINGE | INTRAMUSCULAR | Status: AC
  Administered 2017-05-10: 0.5 mL via INTRAMUSCULAR
  Filled 2017-05-09: qty 0.5

## 2017-05-09 NOTE — Progress Notes (Signed)
Physical Therapy Treatment Patient Details Name: Cassie Peters MRN: [id number] DOB: [birth year] Today's Date: 05/09/2017    History of Present Illness Cassie Gottlieb Foulksis a 83 y.o.femalewith medical history significant of accelerated HTN, CVA with R hemiparesis, Fall in March with fracture, what appears to be CKD stage 4 borderline 5 at baseline as of March this year. Patient presents to the ED with c/o N/V for past 4-5 days.   Acute renal failure superimposed on stage 4 chronic kidney disease ([place]), N/v, Hypertensive urgency    PT Comments    Continuing work on functional mobility and activity tolerance;  Able to walk further with multimodal cues for more symmetrical gait pattern, more weight acceptance into R stance; Fatigued at end of walk; Discussed possibility of getting to Outpt PT with pt and son   Follow Up Recommendations  Outpatient PT     Equipment Recommendations  None recommended by PT    Recommendations for Other Services       Precautions / Restrictions Precautions Precautions: Fall    Mobility  Bed Mobility                  Transfers Overall transfer level: Needs assistance Equipment used: 1 person hand held assist Transfers: Sit to/from Stand Sit to Stand: Mod assist         General transfer comment: Light mod assist to power up and steady; multimodal cues to shift more weight to RLE  Ambulation/Gait Ambulation/Gait assistance: Mod assist Ambulation Distance (Feet): 35 Feet Assistive device: 1 person hand held assist (PT facing pt, hands at pt's pelvis for facilitation) Gait Pattern/deviations: Step-to pattern;Decreased stance time - left;Decreased step length - left;Decreased weight shift to right Gait velocity: slowed   General Gait Details: Multimodal cues to fully weight shift into R stance, and for R hip extension in stance; presents with hemiparetic gait, noting flexion synergy to advance RLE and heavy dependence on LUE support when in R  stance and advancing L foot; tends to scissor, putting pt at high fall risk   Stairs            Wheelchair Mobility    Modified Rankin (Stroke Patients Only)       Balance     Sitting balance-Leahy Scale: Good       Standing balance-Leahy Scale: Poor                              Cognition Arousal/Alertness: Awake/alert Behavior During Therapy: WFL for tasks assessed/performed Overall Cognitive Status: Within Functional Limits for tasks assessed                                        Exercises      General Comments        Pertinent Vitals/Pain Pain Assessment: No/denies pain    Home Living                      Prior Function            PT Goals (current goals can now be found in the care plan section) Acute Rehab PT Goals Patient Stated Goal: REALLY wants to be safe and independent PT Goal Formulation: With patient Time For Goal Achievement: 05/22/17 Potential to Achieve Goals: Good Progress towards PT goals: Progressing toward goals    Frequency  Min 3X/week      PT Plan Current plan remains appropriate    Co-evaluation              AM-PAC PT "6 Clicks" Daily Activity  Outcome Measure  Difficulty turning over in bed (including adjusting bedclothes, sheets and blankets)?: A Little Difficulty moving from lying on back to sitting on the side of the bed? : A Little Difficulty sitting down on and standing up from a chair with arms (e.g., wheelchair, bedside commode, etc,.)?: Unable Help needed moving to and from a bed to chair (including a wheelchair)?: A Lot Help needed walking in hospital room?: A Lot Help needed climbing 3-5 steps with a railing? : A Lot 6 Click Score: 13    End of Session Equipment Utilized During Treatment: Gait belt Activity Tolerance: Patient tolerated treatment well Patient left: in chair;with call bell/phone within reach;with family/visitor present Nurse  Communication: Mobility status PT Visit Diagnosis: Unsteadiness on feet (R26.81);Other symptoms and signs involving the nervous system (R29.898);Hemiplegia and hemiparesis Hemiplegia - Right/Left: Right Hemiplegia - dominant/non-dominant: Non-dominant Hemiplegia - caused by: Cerebral infarction     Time: 4742-5956 PT Time Calculation (min) (ACUTE ONLY): 20 min  Charges:  $Gait Training: 8-22 mins                    G Codes:       Roney Marion, PT  Acute Rehabilitation Services Pager [phone] Office [place] 05/09/2017, 4:28 PM

## 2017-05-09 NOTE — Evaluation (Signed)
Occupational Therapy Evaluation Patient Details Name: Cassie Peters MRN: [id number] DOB: [birth year] Today's Date: 05/09/2017    History of Present Illness Cassie Peters a 83 y.o.femalewith medical history significant of accelerated HTN, CVA with R hemiparesis, Fall in March with fracture, what appears to be CKD stage 4 borderline 5 at baseline as of March this year. Patient presents to the ED with c/o N/V for past 4-5 days.   Acute renal failure superimposed on stage 4 chronic kidney disease ([place]), N/v, Hypertensive urgency   Clinical Impression   PTA Pt required assist for ADL and mobility with SPC. Pt is currently mod A for mobility with RW and mod A for LB ADL, set up for grooming activities. Pt will benefit from skilled OT in the acute setting prior to discharge home with 24 hour supervision. Sessions should focus on balance, functional transfers for ADL.     Follow Up Recommendations  No OT follow up    Equipment Recommendations  None recommended by OT (Pt has appropriate DME)    Recommendations for Other Services       Precautions / Restrictions Precautions Precautions: Fall Restrictions Weight Bearing Restrictions: No      Mobility Bed Mobility               General bed mobility comments: Pt OOB in recliner when OT arrived in room  Transfers Overall transfer level: Needs assistance Equipment used: Rolling walker (2 wheeled) Transfers: Sit to/from Stand Sit to Stand: Mod assist         General transfer comment: Light mod assist to power up and steady; multimodal cues to shift more weight to RLE    Balance Overall balance assessment: Needs assistance;History of Falls Sitting-balance support: No upper extremity supported;Feet supported Sitting balance-Leahy Scale: Good     Standing balance support: Single extremity supported;Bilateral upper extremity supported Standing balance-Leahy Scale: Poor Standing balance comment: dependent on external  support                           ADL either performed or assessed with clinical judgement   ADL Overall ADL's : Needs assistance/impaired Eating/Feeding: Set up;Sitting   Grooming: Wash/dry hands;Wash/dry face;Set up;Sitting Grooming Details (indicate cue type and reason): while sitting on BSC Upper Body Bathing: Moderate assistance;Sitting Upper Body Bathing Details (indicate cue type and reason): assist for under LUE with washcloth Lower Body Bathing: Moderate assistance;Sitting/lateral leans   Upper Body Dressing : Minimal assistance;Sitting   Lower Body Dressing: Moderate assistance;Sitting/lateral leans   Toilet Transfer: Moderate assistance;Ambulation;RW;BSC Toilet Transfer Details (indicate cue type and reason): assist to place RUE on RW (Pt typically walks with cane) and mod A to maintain balance Toileting- Clothing Manipulation and Hygiene: Minimal assistance;Sitting/lateral lean       Functional mobility during ADLs: Moderate assistance;Rolling walker;Caregiver able to provide necessary level of assistance       Vision Baseline Vision/History: Wears glasses Wears Glasses: At all times Patient Visual Report: No change from baseline       Perception     Praxis      Pertinent Vitals/Pain Pain Assessment: No/denies pain     Hand Dominance Left   Extremity/Trunk Assessment Upper Extremity Assessment Upper Extremity Assessment: RUE deficits/detail RUE Deficits / Details: at baseline from prior CVA, limited grasp and gross ROM/strength   Lower Extremity Assessment Lower Extremity Assessment: Defer to PT evaluation   Cervical / Trunk Assessment Cervical / Trunk Assessment: Kyphotic  Communication Communication Communication: No difficulties   Cognition Arousal/Alertness: Awake/alert Behavior During Therapy: WFL for tasks assessed/performed Overall Cognitive Status: Within Functional Limits for tasks assessed                                      General Comments       Exercises     Shoulder Instructions      Home Living Family/patient expects to be discharged to:: Private residence Living Arrangements: Children Available Help at Discharge: Family;Available 24 hours/day Type of Home: House Home Access: Stairs to enter [employer] of Steps: 3 Entrance Stairs-Rails: None Home Layout: One level     Bathroom Shower/Tub: Occupational psychologist: Standard Bathroom Accessibility: Yes How Accessible: Accessible via walker Home Equipment: Bedside commode;Walker - 2 wheels;Cane - single point;Shower seat - built in;Hand held shower head;Adaptive equipment;Cane - quad Adaptive Equipment: Reacher;Sock aid;Long-handled shoe horn;Long-handled sponge        Prior Functioning/Environment Level of Independence: Needs assistance;Independent with assistive device(s)  Gait / Transfers Assistance Needed: used SPC for mobility and needed help up front steps ADL's / Homemaking Assistance Needed: son helps with drying off her back and fastening her bra, son does all cooking            OT Problem List: Impaired balance (sitting and/or standing);Decreased safety awareness;Decreased knowledge of use of DME or AE      OT Treatment/Interventions: Self-care/ADL training;DME and/or AE instruction;Therapeutic activities;Patient/family education;Balance training    OT Goals(Current goals can be found in the care plan section) Acute Rehab OT Goals Patient Stated Goal: to go home and get better/be safe OT Goal Formulation: With patient Time For Goal Achievement: 05/23/17 Potential to Achieve Goals: Good ADL Goals Pt Will Perform Grooming: with set-up;sitting (at sink) Pt Will Perform Upper Body Bathing: with set-up;sitting;with caregiver independent in assisting Pt Will Perform Lower Body Bathing: with set-up;with adaptive equipment;with caregiver independent in assisting;sitting/lateral leans Pt Will  Transfer to Toilet: with min guard assist;ambulating (with SPC) Pt Will Perform Toileting - Clothing Manipulation and hygiene: with min guard assist;sit to/from stand Pt Will Perform Tub/Shower Transfer: Tub transfer;with min guard assist;with caregiver independent in assisting  OT Frequency: Min 2X/week   Barriers to D/C:            Co-evaluation              AM-PAC PT "6 Clicks" Daily Activity     Outcome Measure Help from another person eating meals?: A Little Help from another person taking care of personal grooming?: A Little Help from another person toileting, which includes using toliet, bedpan, or urinal?: A Lot Help from another person bathing (including washing, rinsing, drying)?: A Lot Help from another person to put on and taking off regular upper body clothing?: A Little Help from another person to put on and taking off regular lower body clothing?: A Lot 6 Click Score: 15   End of Session Equipment Utilized During Treatment: Gait belt;Rolling walker Nurse Communication: Mobility status  Activity Tolerance: Patient tolerated treatment well Patient left: in chair;with call bell/phone within reach;with family/visitor present  OT Visit Diagnosis: Unsteadiness on feet (R26.81);Other abnormalities of gait and mobility (R26.89);Repeated falls (R29.6);History of falling (Z91.81);Muscle weakness (generalized) (M62.81)                Time: 6644-0347 OT Time Calculation (min): 26 min Charges:  OT General Charges $OT  Visit: 1 Visit OT Evaluation $OT Eval Moderate Complexity: 1 Mod OT Treatments $Self Care/Home Management : 8-22 mins G-Codes:     Hulda Humphrey OTR/L [place] 05/09/2017, 5:10 PM

## 2017-05-09 NOTE — Progress Notes (Signed)
Cassie Peters    Assessment/ Plan:   1. Acute on chronic kidney disease: I suspect longstanding HTN; also s/p R partial nephrectomy 2009.  Follows with Dr. Moshe Cipro.  Based on her renal ultrasound, I don't think there really is anything reversible here.  Per last Peters from [place] records, she has been relatively independent despite R sided hemiparesis.  I had a more detailed conversation about dialysis with her .  Fortunately Cr continues to improve so don't necessarily need to start here in the hospital.  She is still deciding whether or not she would want to do dialysis. She spoke with her son and niece yesterday.  I will follow up with her son today. If she decides will vein map and c/s VVS.  2.  N/v- still nauseated.  CT scan negative for acute pathology, lipase WNL.  No evidence of UTI.  Zofran prn.  Per primary.  3.  Hypertensive emergency: BP is better- on hydralazine 100 mg TID, amlodipine 10 mg daily.  Adding some labetalol, 200 mg BID.  Pressures are better than admission but need better control.  4.  L complex renal cyst:  Follows with urology- Dr. Alinda Money.  Has been conservatively managed so far.  5  Metabolic acidosis: improved on bicarb tabs  6.  Anemia: on oral iron, Hgb drifted slightly downwards, monitor.  7.  Bone/ mineral metabolism: PTH 244 9/11, Vit D 18.6.    Subjective:    She was "sick all night last night".     Objective:   BP (!) 149/83   Pulse 88   Temp 98.7 F (37.1 C)   Resp 16   Ht 5\' 5"  (1.651 m)   Wt 51 kg (112 lb 7 oz)   SpO2 98%   BMI 18.71 kg/m   Intake/Output Summary (Last 24 hours) at 05/09/17 1002 Last data filed at 05/09/17 0601  Gross per 24 hour  Intake              240 ml  Output                0 ml  Net              240 ml   Weight change: -1.617 kg (-3 lb 9.1 oz)  Physical Exam: GEN lying in bed, looks like she feels bad. HEENT EOMI, PERRL, MMM NECK JVD improved PULM normal WOB, clear CV RRR  loud S2 ABD soft, nontender, nondistended, hypoactive bowel sounds.   EXT no LE edema NEURO R sided weakness, R facial droop SKIN no rashes or lesions  Imaging: No results found.  Labs: BMET  Recent Labs Lab 05/06/17 1735 05/07/17 0240 05/08/17 0520 05/09/17 0436  NA 139 139 140 141  K 4.0 3.8 3.8 3.6  CL 111 112* 108 109  CO2 18* 12* 22 23  GLUCOSE 112* 95 79 107*  BUN 73* 68* 64* 58*  CREATININE 6.77* 6.31* 6.03* 5.93*  CALCIUM 8.8* 8.2* 8.2* 8.4*  PHOS  --   --  4.7* 4.1   CBC  Recent Labs Lab 05/06/17 1735 05/08/17 0520 05/09/17 0436  WBC 7.4 7.8 10.4  HGB 9.0* 8.1* 9.0*  HCT 27.5* 25.4* 28.3*  MCV 85.4 84.9 84.5  PLT 125* 122* 137*    Medications:    . amLODipine  10 mg Oral Daily  . aspirin EC  81 mg Oral Daily  . atorvastatin  20 mg Oral QHS  . citalopram  10 mg Oral  Daily  . docusate sodium  100 mg Oral Daily  . ferrous sulfate  325 mg Oral BID WC  . heparin  5,000 Units Subcutaneous Q8H  . hydrALAZINE  100 mg Oral TID  . labetalol  200 mg Oral BID  . nortriptyline  25 mg Oral QHS  . pantoprazole  40 mg Oral Daily  . sodium bicarbonate  1,300 mg Oral BID      Madelon Lips, MD [place] pgr [phone] 05/09/2017, 10:02 AM

## 2017-05-09 NOTE — Progress Notes (Signed)
PROGRESS NOTE    Cassie Peters  HER:[id number] DOB: 1933/07/08 DOA: 05/06/2017 PCP: Jonathon Jordan, MD   Outpatient Specialists:    Brief Narrative:  Cassie Peters is a 83 y.o. female with medical history significant of accelerated HTN, what appears to be CKD stage 4 borderline 5 at baseline as of March this year.  Patient presents to the ED with c/o N/V for past 4-5 days.     Assessment & Plan:   Principal Problem:   Acute renal failure superimposed on stage 4 chronic kidney disease ([facility]) Active Problems:   Nausea & vomiting   Hypertensive urgency   AKF on CKD stage 4 -  -renal consult appreciated: most likely non-reversible-- patient and son to discuss dialysis  -Renal US- medical renal disease  -d/c nephrotoxic ACEi  -Strict intake and output  HTN urg - improve Hold nephrotoxic ACEi Labetalol PRN  N/V - -Zofran PRN -resolved   DVT prophylaxis:  SQ Heparin  Code Status: Full Code   Family Communication: Pt declined my offer of calling family  Disposition Plan:     Consultants:   renal    Subjective: Developed nausea last PM-- sick to stomach this AM-- no BM since SAturday per patient +flatus  Objective: Vitals:   05/08/17 1000 05/08/17 1725 05/08/17 2040 05/09/17 0531  BP: (!) 170/79 132/74 (!) 146/99 (!) 149/83  Pulse: 80 78 80 88  Resp: 18 18 14 16   Temp: 98 F (36.7 C) 98 F (36.7 C) 97.9 F (36.6 C) 98.7 F (37.1 C)  TempSrc: Oral Oral    SpO2: 98% 98% 100% 98%  Weight:   51 kg (112 lb 7 oz)   Height:        Intake/Output Summary (Last 24 hours) at 05/09/17 1149 Last data filed at 05/09/17 0601  Gross per 24 hour  Intake              240 ml  Output                0 ml  Net              240 ml   Filed Weights   05/07/17 0147 05/07/17 2028 05/08/17 2040  Weight: 50 kg (110 lb 3.7 oz) 52.6 kg (116 lb) 51 kg (112 lb 7 oz)    Examination:  General exam: up in chair-- appears sick Respiratory system: diminished, no  wheezing Cardiovascular system: no edema in LE Gastrointestinal system: +BS, NT Central nervous system: right arm paralysis Skin: no rashed     Data Reviewed: I have personally reviewed following labs and imaging studies  CBC:  Recent Labs Lab 05/06/17 1735 05/08/17 0520 05/09/17 0436  WBC 7.4 7.8 10.4  HGB 9.0* 8.1* 9.0*  HCT 27.5* 25.4* 28.3*  MCV 85.4 84.9 84.5  PLT 125* 122* 856*   Basic Metabolic Panel:  Recent Labs Lab 05/06/17 1735 05/07/17 0240 05/08/17 0520 05/09/17 0436  NA 139 139 140 141  K 4.0 3.8 3.8 3.6  CL 111 112* 108 109  CO2 18* 12* 22 23  GLUCOSE 112* 95 79 107*  BUN 73* 68* 64* 58*  CREATININE 6.77* 6.31* 6.03* 5.93*  CALCIUM 8.8* 8.2* 8.2* 8.4*  PHOS  --   --  4.7* 4.1   GFR: Estimated Creatinine Clearance: 5.9 mL/min (A) (by C-G formula based on SCr of 5.93 mg/dL (H)). Liver Function Tests:  Recent Labs Lab 05/06/17 1735 05/08/17 0520 05/09/17 0436  AST 12*  --   --  ALT 6*  --   --   ALKPHOS 56  --   --   BILITOT 0.7  --   --   PROT 6.1*  --   --   ALBUMIN 3.3* 2.8* 3.2*    Recent Labs Lab 05/06/17 1735  LIPASE 36   No results for input(s): AMMONIA in the last 168 hours. Coagulation Profile: No results for input(s): INR, PROTIME in the last 168 hours. Cardiac Enzymes: No results for input(s): CKTOTAL, CKMB, CKMBINDEX, TROPONINI in the last 168 hours. BNP (last 3 results) No results for input(s): PROBNP in the last 8760 hours. HbA1C: No results for input(s): HGBA1C in the last 72 hours. CBG: No results for input(s): GLUCAP in the last 168 hours. Lipid Profile: No results for input(s): CHOL, HDL, LDLCALC, TRIG, CHOLHDL, LDLDIRECT in the last 72 hours. Thyroid Function Tests: No results for input(s): TSH, T4TOTAL, FREET4, T3FREE, THYROIDAB in the last 72 hours. Anemia Panel:  Recent Labs  05/08/17 0520  FERRITIN 327*  TIBC 179*  IRON 42   Urine analysis:    Component Value Date/Time   COLORURINE STRAW (A)  05/06/2017 2012   APPEARANCEUR CLEAR 05/06/2017 2012   LABSPEC 1.009 05/06/2017 2012   PHURINE 5.0 05/06/2017 2012   GLUCOSEU NEGATIVE 05/06/2017 2012   HGBUR NEGATIVE 05/06/2017 2012   [place] NEGATIVE 05/06/2017 2012   [place] NEGATIVE 05/06/2017 2012   PROTEINUR >=300 (A) 05/06/2017 2012   UROBILINOGEN 0.2 01/01/2012 1745   NITRITE NEGATIVE 05/06/2017 2012   LEUKOCYTESUR NEGATIVE 05/06/2017 2012    )No results found for this or any previous visit (from the past 240 hour(s)).    Anti-infectives    None       Radiology Studies: No results found.      Scheduled Meds: . amLODipine  10 mg Oral Daily  . aspirin EC  81 mg Oral Daily  . atorvastatin  20 mg Oral QHS  . citalopram  10 mg Oral Daily  . docusate sodium  100 mg Oral Daily  . ferrous sulfate  325 mg Oral BID WC  . heparin  5,000 Units Subcutaneous Q8H  . hydrALAZINE  100 mg Oral TID  . labetalol  200 mg Oral BID  . nortriptyline  25 mg Oral QHS  . pantoprazole  40 mg Oral Daily  . sodium bicarbonate  1,300 mg Oral BID   Continuous Infusions:    LOS: 2 days    Time spent: 25 min    [place], DO [facility] Pager [phone]  If 7PM-7AM, please contact night-coverage [email or web address] Password [facility] 05/09/2017, 11:49 AM

## 2017-05-10 ENCOUNTER — Inpatient Hospital Stay (HOSPITAL_COMMUNITY): Payer: Medicare Other

## 2017-05-10 DIAGNOSIS — N179 Acute kidney failure, unspecified: Secondary | ICD-10-CM

## 2017-05-10 DIAGNOSIS — N184 Chronic kidney disease, stage 4 (severe): Secondary | ICD-10-CM

## 2017-05-10 DIAGNOSIS — N186 End stage renal disease: Secondary | ICD-10-CM

## 2017-05-10 LAB — CBC
HCT: 24.8 % — ABNORMAL LOW (ref 36.0–46.0)
HEMATOCRIT: 23.7 % — AB (ref 36.0–46.0)
HEMOGLOBIN: 7.7 g/dL — AB (ref 12.0–15.0)
Hemoglobin: 8 g/dL — ABNORMAL LOW (ref 12.0–15.0)
MCH: 27.9 pg (ref 26.0–34.0)
MCH: 27.7 pg (ref 26.0–34.0)
MCHC: 32.5 g/dL (ref 30.0–36.0)
MCHC: 32.3 g/dL (ref 30.0–36.0)
MCV: 85.9 fL (ref 78.0–100.0)
MCV: 85.8 fL (ref 78.0–100.0)
PLATELETS: 117 10*3/uL — AB (ref 150–400)
Platelets: 112 10*3/uL — ABNORMAL LOW (ref 150–400)
RBC: 2.76 MIL/uL — ABNORMAL LOW (ref 3.87–5.11)
RBC: 2.89 MIL/uL — ABNORMAL LOW (ref 3.87–5.11)
RDW: 15.2 % (ref 11.5–15.5)
RDW: 15.2 % (ref 11.5–15.5)
WBC: 7.6 10*3/uL (ref 4.0–10.5)
WBC: 8.6 10*3/uL (ref 4.0–10.5)

## 2017-05-10 LAB — BASIC METABOLIC PANEL
ANION GAP: 8 (ref 5–15)
Anion gap: 9 (ref 5–15)
BUN: 57 mg/dL — AB (ref 6–20)
BUN: 55 mg/dL — AB (ref 6–20)
CALCIUM: 8.1 mg/dL — AB (ref 8.9–10.3)
CO2: 26 mmol/L (ref 22–32)
CO2: 23 mmol/L (ref 22–32)
CREATININE: 6.26 mg/dL — AB (ref 0.44–1.00)
Calcium: 8 mg/dL — ABNORMAL LOW (ref 8.9–10.3)
Chloride: 105 mmol/L (ref 101–111)
Chloride: 107 mmol/L (ref 101–111)
Creatinine, Ser: 6.25 mg/dL — ABNORMAL HIGH (ref 0.44–1.00)
GFR calc Af Amer: 6 mL/min — ABNORMAL LOW (ref >60)
GFR calc Af Amer: 6 mL/min — ABNORMAL LOW (ref >60)
GFR calc non Af Amer: 6 mL/min — ABNORMAL LOW (ref >60)
GFR, EST NON AFRICAN AMERICAN: 6 mL/min — AB (ref >60)
GLUCOSE: 107 mg/dL — AB (ref 65–99)
GLUCOSE: 148 mg/dL — AB (ref 65–99)
POTASSIUM: 4 mmol/L (ref 3.5–5.1)
Potassium: 3.5 mmol/L (ref 3.5–5.1)
SODIUM: 139 mmol/L (ref 135–145)
Sodium: 139 mmol/L (ref 135–145)

## 2017-05-10 MED ORDER — SORBITOL 70 % SOLN: 30.0000 mL | Freq: Every day | Status: DC | PRN

## 2017-05-10 NOTE — Progress Notes (Signed)
PROGRESS NOTE    Cassie Peters  BHA:[id number] DOB: 1933-02-14 DOA: 05/06/2017 PCP: Jonathon Jordan, MD   Outpatient Specialists:    Brief Narrative:  Cassie Peters is a 83 y.o. female with medical history significant of accelerated HTN, what appears to be CKD stage 4 borderline 5 at baseline as of March this year.  Patient presents to the ED with c/o N/V for past 4-5 days.     Assessment & Plan:   Principal Problem:   Acute renal failure superimposed on stage 4 chronic kidney disease (HCC) Active Problems:   Nausea & vomiting   Hypertensive urgency   AKF on CKD stage 4 -  -renal consult appreciated: most likely non-reversible--will get vein mapping for eventual dialysis-- should not need during this hospitalization but may need in near future  -Renal US- medical renal disease  -d/c nephrotoxic ACEi  -Strict intake and output  HTN urg - improved Hold nephrotoxic ACEi Labetalol PRN  N/V - -Zofran PRN -waxes and wanes -sluggish BS today-- will get x ray -no BM since Saturday per patient  Decrease in Hgb -will repeat labs as drop in plts and Hgb -not on heparin -no abdominal pain -check for blood in stools  DVT prophylaxis:  SQ Heparin  Code Status: Full Code   Family Communication: Pt declined my offer of calling family  Disposition Plan:  Home after renal status stabilized   Consultants:   renal    Subjective: No flatus or BM per patient Nausea improved from yesterday  Objective: Vitals:   05/09/17 0531 05/09/17 1824 05/09/17 2046 05/10/17 0419  BP: (!) 149/83 (!) 156/58 (!) 181/67 (!) 160/55  Pulse: 88 80 64 (!) 57  Resp: 16 17 16 16   Temp: 98.7 F (37.1 C) 98 F (36.7 C) 98.2 F (36.8 C) 98.2 F (36.8 C)  TempSrc:  Oral Oral Oral  SpO2: 98% 99% 100% 100%  Weight:   51.8 kg (114 lb 1.6 oz)   Height:        Intake/Output Summary (Last 24 hours) at 05/10/17 0925 Last data filed at 05/10/17 0600  Gross per 24 hour  Intake               720 ml  Output              250 ml  Net              470 ml   Filed Weights   05/07/17 2028 05/08/17 2040 05/09/17 2046  Weight: 52.6 kg (116 lb) 51 kg (112 lb 7 oz) 51.8 kg (114 lb 1.6 oz)    Examination:  General exam: more comfortable appearing Respiratory system: no wheezing Cardiovascular system: rrr, no edema in LE Gastrointestinal system: diminished bowel sounds, NT abd Central nervous system: does not move right arm Skin: no rashes or lesions     Data Reviewed: I have personally reviewed following labs and imaging studies  CBC:  Recent Labs Lab 05/06/17 1735 05/08/17 0520 05/09/17 0436 05/10/17 0310  WBC 7.4 7.8 10.4 7.6  HGB 9.0* 8.1* 9.0* 7.7*  HCT 27.5* 25.4* 28.3* 23.7*  MCV 85.4 84.9 84.5 85.9  PLT 125* 122* 137* 973*   Basic Metabolic Panel:  Recent Labs Lab 05/06/17 1735 05/07/17 0240 05/08/17 0520 05/09/17 0436 05/10/17 0310  NA 139 139 140 141 139  K 4.0 3.8 3.8 3.6 4.0  CL 111 112* 108 109 105  CO2 18* 12* 22 23 26   GLUCOSE 112* 95 79  107* 107*  BUN 73* 68* 64* 58* 57*  CREATININE 6.77* 6.31* 6.03* 5.93* 6.25*  CALCIUM 8.8* 8.2* 8.2* 8.4* 8.0*  PHOS  --   --  4.7* 4.1  --    GFR: Estimated Creatinine Clearance: 5.7 mL/min (A) (by C-G formula based on SCr of 6.25 mg/dL (H)). Liver Function Tests:  Recent Labs Lab 05/06/17 1735 05/08/17 0520 05/09/17 0436  AST 12*  --   --   ALT 6*  --   --   ALKPHOS 56  --   --   BILITOT 0.7  --   --   PROT 6.1*  --   --   ALBUMIN 3.3* 2.8* 3.2*    Recent Labs Lab 05/06/17 1735  LIPASE 36   No results for input(s): AMMONIA in the last 168 hours. Coagulation Profile: No results for input(s): INR, PROTIME in the last 168 hours. Cardiac Enzymes: No results for input(s): CKTOTAL, CKMB, CKMBINDEX, TROPONINI in the last 168 hours. BNP (last 3 results) No results for input(s): PROBNP in the last 8760 hours. HbA1C: No results for input(s): HGBA1C in the last 72 hours. CBG: No results  for input(s): GLUCAP in the last 168 hours. Lipid Profile: No results for input(s): CHOL, HDL, LDLCALC, TRIG, CHOLHDL, LDLDIRECT in the last 72 hours. Thyroid Function Tests: No results for input(s): TSH, T4TOTAL, FREET4, T3FREE, THYROIDAB in the last 72 hours. Anemia Panel:  Recent Labs  05/08/17 0520  FERRITIN 327*  TIBC 179*  IRON 42   Urine analysis:    Component Value Date/Time   COLORURINE STRAW (A) 05/06/2017 2012   APPEARANCEUR CLEAR 05/06/2017 2012   LABSPEC 1.009 05/06/2017 2012   PHURINE 5.0 05/06/2017 2012   GLUCOSEU NEGATIVE 05/06/2017 2012   HGBUR NEGATIVE 05/06/2017 2012   [place] NEGATIVE 05/06/2017 2012   [place] NEGATIVE 05/06/2017 2012   PROTEINUR >=300 (A) 05/06/2017 2012   UROBILINOGEN 0.2 01/01/2012 1745   NITRITE NEGATIVE 05/06/2017 2012   LEUKOCYTESUR NEGATIVE 05/06/2017 2012    )No results found for this or any previous visit (from the past 240 hour(s)).    Anti-infectives    None       Radiology Studies: No results found.      Scheduled Meds: . amLODipine  10 mg Oral Daily  . aspirin EC  81 mg Oral Daily  . atorvastatin  20 mg Oral QHS  . citalopram  10 mg Oral Daily  . docusate sodium  100 mg Oral Daily  . ferrous sulfate  325 mg Oral BID WC  . heparin  5,000 Units Subcutaneous Q8H  . hydrALAZINE  100 mg Oral TID  . Influenza vac split quadrivalent PF  0.5 mL Intramuscular Tomorrow-1000  . labetalol  200 mg Oral BID  . nortriptyline  25 mg Oral QHS  . pantoprazole  40 mg Oral Daily  . sodium bicarbonate  1,300 mg Oral BID   Continuous Infusions:    LOS: 3 days    Time spent: 25 min    [place], DO [facility] Pager [phone]  If 7PM-7AM, please contact night-coverage [email or web address] Password [facility] 05/10/2017, 9:25 AM

## 2017-05-10 NOTE — Progress Notes (Signed)
   Daily Progress Note   Briefly reviewed chart.  Will reserve a spot for access this coming Monday, probably L arm access given R sided hemiparesis.  - Full consult to follow  Adele Barthel, MD, [place] [facility] [place] Office: [phone] Pager: [phone]  05/10/2017, 10:21 AM

## 2017-05-10 NOTE — Progress Notes (Addendum)
[facility] Progress Note    Assessment/ Plan:   1. Acute on chronic kidney disease: I suspect longstanding HTN; also s/p R partial nephrectomy 2009.  Follows with Dr. Moshe Cipro.  Based on her renal ultrasound, I don't think there really is anything reversible here.  Per last note from [place] records, she has been relatively independent despite R sided hemiparesis.  Cr initially looked like it was improving, but now back up to 6.3.  Doesn't have any uremic symptoms at present.  Will vein map and c/s VVS.  2.  N/v- still nauseated.  CT scan negative for acute pathology, lipase WNL.  No evidence of UTI.  Zofran prn.  Per primary- getting AXR today.  3.  Hypertensive emergency: BP is better- on hydralazine 100 mg TID, amlodipine 10 mg daily.  Labetalol 200 mg BID added, pressures improved.    4.  L complex renal cyst:  Follows with urology- Dr. Alinda Money.  Has been conservatively managed so far.  5  Metabolic acidosis: improved on bicarb tabs  6.  Anemia: on oral iron, Hgb up and down- will likely need ESA, ? If that's advisable with h/o complex cysts and h/o renal carcinoma  7.  Bone/ mineral metabolism: PTH 244 9/11, Vit D 18.6.    Subjective:    Sitting up eating breakfast.  Spoke with pt and son- willing to proceed with dialysis if needed.      Objective:   BP (!) 160/55 (BP Location: Left Arm)   Pulse 66   Temp 98.2 F (36.8 C) (Oral)   Resp 16   Ht 5\' 5"  (1.651 m)   Wt 51.8 kg (114 lb 1.6 oz)   SpO2 100%   BMI 18.99 kg/m   Intake/Output Summary (Last 24 hours) at 05/10/17 0946 Last data filed at 05/10/17 0600  Gross per 24 hour  Intake              720 ml  Output              250 ml  Net              470 ml   Weight change: 0.756 kg (1 lb 10.7 oz)  Physical Exam: GEN lying in bed, looks like she feels bad. HEENT EOMI, PERRL, MMM NECK JVD improved PULM normal WOB, clear CV RRR loud S2 ABD soft, nontender, nondistended, hypoactive bowel sounds.    EXT no LE edema NEURO R sided weakness, R facial droop SKIN no rashes or lesions  Imaging: No results found.  Labs: BMET  Recent Labs Lab 05/06/17 1735 05/07/17 0240 05/08/17 0520 05/09/17 0436 05/10/17 0310  NA 139 139 140 141 139  K 4.0 3.8 3.8 3.6 4.0  CL 111 112* 108 109 105  CO2 18* 12* 22 23 26   GLUCOSE 112* 95 79 107* 107*  BUN 73* 68* 64* 58* 57*  CREATININE 6.77* 6.31* 6.03* 5.93* 6.25*  CALCIUM 8.8* 8.2* 8.2* 8.4* 8.0*  PHOS  --   --  4.7* 4.1  --    CBC  Recent Labs Lab 05/06/17 1735 05/08/17 0520 05/09/17 0436 05/10/17 0310  WBC 7.4 7.8 10.4 7.6  HGB 9.0* 8.1* 9.0* 7.7*  HCT 27.5* 25.4* 28.3* 23.7*  MCV 85.4 84.9 84.5 85.9  PLT 125* 122* 137* 112*    Medications:    . amLODipine  10 mg Oral Daily  . aspirin EC  81 mg Oral Daily  . atorvastatin  20 mg Oral QHS  .  citalopram  10 mg Oral Daily  . docusate sodium  100 mg Oral Daily  . ferrous sulfate  325 mg Oral BID WC  . heparin  5,000 Units Subcutaneous Q8H  . hydrALAZINE  100 mg Oral TID  . labetalol  200 mg Oral BID  . nortriptyline  25 mg Oral QHS  . pantoprazole  40 mg Oral Daily  . sodium bicarbonate  1,300 mg Oral BID      Madelon Lips, MD [facility] pgr [phone] 05/10/2017, 9:46 AM

## 2017-05-10 NOTE — Consult Note (Addendum)
Requested by:  Dr. Hollie Salk (Nephrology)  Reason for consultation: New access   History of Present Illness   Cassie Peters is a 83 y.o. ([birth year]) female who presents for evaluation for permanent access.  The patient is left hand dominant but had a stroke that has resulted in residual right arm > leg hemiparesis.  The patient has not had previous access procedures.  Previous central venous cannulation procedures include: none.  The patient has never had a PPM placed.   Past Medical History:  Diagnosis Date  . Angio-edema    one episode in 1st week of March 2018 - did not see dr.- son and pt state mouth and tongue swelled so bad she could not talk  . Hypertension   . Stroke [facility]) 2011   right arm and right leg effected  . Thyroid disorder     Past Surgical History:  Procedure Laterality Date  . ABDOMINAL HYSTERECTOMY     "partial hysterectomy"  . INTRAMEDULLARY (IM) NAIL INTERTROCHANTERIC Right 11/03/2016   Procedure: INTRAMEDULLARY (IM) NAIL INTERTROCHANTRIC;  Surgeon: Paralee Cancel, MD;  Location: [facility];  Service: Orthopedics;  Laterality: Right;  . right kidney surgery  approx 2010   "cyst removed from kidney"    Social History   Social History  . Marital status: Widowed    Spouse name: N/A  . Number of children: N/A  . Years of education: N/A   Occupational History  . Not on file.   Social History Main Topics  . Smoking status: Former Smoker    Years: 20.00    Types: Cigarettes    Quit date: 11/03/1991  . Smokeless tobacco: Never Used     Comment: smoked heavily in the past  . Alcohol use No  . Drug use: No  . Sexual activity: Not on file   Other Topics Concern  . Not on file   Social History Narrative  . No narrative on file   Family History: patient is unable to detail the medical history of his parents   Current Facility-Administered Medications  Medication Dose Route Frequency Provider Last Rate Last Dose  . acetaminophen (TYLENOL) tablet 650 mg  650  mg Oral Q6H PRN Etta Quill, DO   650 mg at 05/09/17 2115   Or  . acetaminophen (TYLENOL) suppository 650 mg  650 mg Rectal Q6H PRN Etta Quill, DO      . amLODipine (NORVASC) tablet 10 mg  10 mg Oral Daily Jennette Kettle M, DO   10 mg at 05/10/17 1540  . aspirin EC tablet 81 mg  81 mg Oral Daily Etta Quill, DO   81 mg at 05/10/17 0867  . atorvastatin (LIPITOR) tablet 20 mg  20 mg Oral QHS Jennette Kettle M, DO   20 mg at 05/09/17 2114  . citalopram (CELEXA) tablet 10 mg  10 mg Oral Daily Jennette Kettle M, DO   10 mg at 05/10/17 6195  . docusate sodium (COLACE) capsule 100 mg  100 mg Oral Daily Jennette Kettle M, DO   100 mg at 05/10/17 0932  . ferrous sulfate tablet 325 mg  325 mg Oral BID WC Etta Quill, DO   325 mg at 05/10/17 6712  . heparin injection 5,000 Units  5,000 Units Subcutaneous Q8H Etta Quill, DO   5,000 Units at 05/09/17 2115  . hydrALAZINE (APRESOLINE) tablet 100 mg  100 mg Oral TID Etta Quill, DO   100 mg at 05/10/17 4580  .  labetalol (NORMODYNE) tablet 200 mg  200 mg Oral BID Madelon Lips, MD   200 mg at 05/10/17 9563  . labetalol (NORMODYNE,TRANDATE) injection 10 mg  10 mg Intravenous Q2H PRN Etta Quill, DO   10 mg at 05/08/17 0043  . nortriptyline (PAMELOR) capsule 25 mg  25 mg Oral QHS Jennette Kettle M, DO   25 mg at 05/09/17 2115  . ondansetron (ZOFRAN) injection 4 mg  4 mg Intravenous Q6H PRN Etta Quill, DO   4 mg at 05/09/17 0935  . pantoprazole (PROTONIX) EC tablet 40 mg  40 mg Oral Daily Jennette Kettle M, DO   40 mg at 05/10/17 8756  . polyethylene glycol (MIRALAX / GLYCOLAX) packet 17 g  17 g Oral Daily PRN Etta Quill, DO      . senna (SENOKOT) tablet 8.6 mg  1 tablet Oral Daily PRN Etta Quill, DO      . sodium bicarbonate tablet 1,300 mg  1,300 mg Oral BID Madelon Lips, MD   1,300 mg at 05/10/17 4332  . sorbitol 70 % solution 30 mL  30 mL Oral Daily PRN Eulogio Bear U, DO        Allergies  Allergen  Reactions  . Cardura [Doxazosin Mesylate]     Allergic to pill form  . Clonidine Derivatives     Allergic to pill form  . Prinzide [Lisinopril-Hydrochlorothiazide]     Allergic to pill form    REVIEW OF SYSTEMS (negative unless checked):   Cardiac:  []  Chest pain or chest pressure? []  Shortness of breath upon activity? []  Shortness of breath when lying flat? []  Irregular heart rhythm?  Vascular:  []  Pain in calf, thigh, or hip brought on by walking? []  Pain in feet at night that wakes you up from your sleep? []  Blood clot in your veins? []  Leg swelling?  Pulmonary:  []  Oxygen at home? []  Productive cough? []  Wheezing?  Neurologic:  [x]  Sudden weakness in arms or legs? []  Sudden numbness in arms or legs? []  Sudden onset of difficult speaking or slurred speech? []  Temporary loss of vision in one eye? []  Problems with dizziness?  Gastrointestinal:  []  Blood in stool? []  Vomited blood? []  Nausea/Vomiting  Genitourinary:  []  Burning when urinating? []  Blood in urine?  Psychiatric:  []  Major depression  Hematologic:  []  Bleeding problems? []  Problems with blood clotting?  Dermatologic:  []  Rashes or ulcers?  Constitutional:  []  Fever or chills?  Ear/Nose/Throat:  []  Change in hearing? []  Nose bleeds? []  Sore throat?  Musculoskeletal:  []  Back pain? []  Joint pain? []  Muscle pain?   Physical Examination     Vitals:   05/09/17 2046 05/10/17 0419 05/10/17 0937 05/10/17 1000  BP: (!) 181/67 (!) 160/55  (!) 152/58  Pulse: 64 (!) 57 66 68  Resp: 16 16  18   Temp: 98.2 F (36.8 C) 98.2 F (36.8 C)  98.8 F (37.1 C)  TempSrc: Oral Oral  Oral  SpO2: 100% 100%  100%  Weight: 114 lb 1.6 oz (51.8 kg)     Height:       Body mass index is 18.99 kg/m.  General Alert, O x 3, Cachectic, Elderly  Head [place]/AT,    Ear/Nose/ Throat Hearing grossly intact, nares without erythema or drainage, oropharynx without Erythema or Exudate, Mallampati score: 3,     Eyes PERRLA, EOMI,    Neck Supple, mid-line trachea,    Pulmonary Sym exp, good B air movt, intermittent  rales in B lungs  Cardiac RRR, Nl S1, S2, no Murmurs, No rubs, No S3,S4  Vascular Vessel Right Left  Radial Palpable Palpable  Brachial Palpable Palpable  Carotid Palpable, No Bruit Palpable, No Bruit  Aorta Not palpable N/A  Femoral Not palpable due to positioning Not palpable due to positioning  Popliteal Not palpable Not palpable  PT Faintly palpable Faintly palpable  DP Faintly palpable Faintly palpable    Gastro- intestinal soft, non-distended, non-tender to palpation, No guarding or rebound, no HSM, no masses, no CVAT B, No palpable prominent aortic pulse,    Musculo- skeletal RUE 0/5, R arm is contracted, LUE 5/5, RLE 3-4/5, LLE 5/5  , Extremities without ischemic changes  , No edema present, No visible varicosities , No Lipodermatosclerosis present  Neurologic Cranial nerves 2-12 intact except R shoulder shrug, Pain and light touch intact in extremities , Motor exam as listed above  Psychiatric Judgement intact, Mood & affect appropriate for pt's clinical situation  Dermatologic See M/S exam for extremity exam, No rashes otherwise noted  Lymphatic  Palpable lymph nodes: None    Laboratory   CBC CBC Latest Ref Rng & Units 05/10/2017 05/10/2017 05/09/2017  WBC 4.0 - 10.5 K/uL 8.6 7.6 10.4  Hemoglobin 12.0 - 15.0 g/dL 8.0(L) 7.7(L) 9.0(L)  Hematocrit 36.0 - 46.0 % 24.8(L) 23.7(L) 28.3(L)  Platelets 150 - 400 K/uL 117(L) 112(L) 137(L)    BMP BMP Latest Ref Rng & Units 05/10/2017 05/10/2017 05/09/2017  Glucose 65 - 99 mg/dL 148(H) 107(H) 107(H)  BUN 6 - 20 mg/dL 55(H) 57(H) 58(H)  Creatinine 0.44 - 1.00 mg/dL 6.26(H) 6.25(H) 5.93(H)  Sodium 135 - 145 mmol/L 139 139 141  Potassium 3.5 - 5.1 mmol/L 3.5 4.0 3.6  Chloride 101 - 111 mmol/L 107 105 109  CO2 22 - 32 mmol/L 23 26 23   Calcium 8.9 - 10.3 mg/dL 8.1(L) 8.0(L) 8.4(L)    Coagulation Lab Results  Component Value  Date   INR 1.07 11/02/2016   INR 1.0 02/24/2009   INR 1.0 02/24/2009   No results found for: PTT  Lipids    Component Value Date/Time   CHOL (H) 02/25/2009 0615    245        ATP III CLASSIFICATION:  <200     mg/dL   Desirable  200-239  mg/dL   Borderline High  >=240    mg/dL   High          TRIG 94 02/25/2009 0615   HDL 48 02/25/2009 0615   CHOLHDL 5.1 02/25/2009 0615   VLDL 19 02/25/2009 0615   LDLCALC (H) 02/25/2009 0615    178        Total Cholesterol/HDL:CHD Risk Coronary Heart Disease Risk Table                     Men   Women  1/2 Average Risk   3.4   3.3  Average Risk       5.0   4.4  2 X Average Risk   9.6   7.1  3 X Average Risk  23.4   11.0        Use the calculated Patient Ratio above and the CHD Risk Table to determine the patient's CHD Risk.        ATP III CLASSIFICATION (LDL):  <100     mg/dL   Optimal  100-129  mg/dL   Near or Above  Optimal  130-159  mg/dL   Borderline  160-189  mg/dL   High  >190     mg/dL   Very High    Radiology     Dg Abd Portable 1v  Result Date: 05/10/2017 CLINICAL DATA:  Nausea. EXAM: PORTABLE ABDOMEN - 1 VIEW COMPARISON:  None. FINDINGS: The bowel gas pattern is normal. Residual contrast is seen in the colon. Phleboliths are noted in the pelvis. IMPRESSION: No evidence of bowel obstruction or ileus. Electronically Signed   By: Marijo Conception, M.D.   On: 05/10/2017 11:31    Non-invasive Vascular Imaging   BUE Vein Mapping  ordered   Medical Decision Making   Cassie Peters is a 83 y.o. female who presents with acute on chronic kidney disease stage IV-V   I don't think the R arm is a candidate for permanent access placement given this patient's contracture related to CVA.  Vein mapping is pending for determination of L arm access option.  I had an extensive discussion with this patient in regards to the nature of access surgery, including risk, benefits, and alternatives.    The patient is  aware that the risks of access surgery include but are not limited to: bleeding, infection, steal syndrome, nerve damage, ischemic monomelic neuropathy, failure of access to mature, complications related to venous hypertension, and possible need for additional access procedures in the future.  The patient has agreed to proceed with permanent access once options are known.   Adele Barthel, MD, FACS [facility] [place] Office: [phone] Pager: [phone]  05/10/2017, 1:15 PM

## 2017-05-11 MED ORDER — BISACODYL 10 MG RE SUPP: 10.0000 mg | Freq: Every day | RECTAL | Status: DC | PRN

## 2017-05-11 MED ORDER — POLYETHYLENE GLYCOL 3350 17 G PO PACK
17.0000 g | PACK | Freq: Every day | ORAL | Status: DC
  Administered 2017-05-11 – 2017-05-16 (×4): 17 g via ORAL
  Filled 2017-05-11 (×5): qty 1

## 2017-05-11 MED ORDER — DOCUSATE SODIUM 100 MG PO CAPS
100.0000 mg | ORAL_CAPSULE | Freq: Two times a day (BID) | ORAL | Status: DC
  Administered 2017-05-11 – 2017-05-17 (×10): 100 mg via ORAL
  Filled 2017-05-11 (×11): qty 1

## 2017-05-11 NOTE — Progress Notes (Signed)
[facility] Progress Note    Assessment/ Plan:   1. Acute on chronic kidney disease: I suspect longstanding HTN; also s/p R partial nephrectomy 2009.  Follows with Dr. Moshe Cipro.  Based on her renal ultrasound, I don't think there really is anything reversible here.  Per last note from [place] records, she has been relatively independent despite R sided hemiparesis.  Cr initially looked like it was improving, but now back up to 6.3.  Doesn't have any uremic symptoms at present.  Access placement for 9/17.  Appreciate VVS assistance  2.  N/v- comes and goes, not sure related to uremia.  CT scan negative for acute pathology, lipase WNL.  No evidence of UTI.  Zofran prn.  AXR without acute pathology.  Per primary.  3.  Hypertensive emergency: BP is better- on hydralazine 100 mg TID, amlodipine 10 mg daily.  Labetalol 200 mg BID added, pressures improved.    4.  L complex renal cyst:  Follows with urology- Dr. Alinda Money.  Has been conservatively managed so far.  5  Metabolic acidosis: improved on bicarb tabs  6.  Anemia: on oral iron, Hgb up and down- will likely need ESA, ? If that's advisable with h/o complex cysts and h/o renal carcinoma  7.  Bone/ mineral metabolism: PTH 244 9/11, Vit D 18.6.    Subjective:    No acute events.  Feeling well.     Objective:   BP (!) 148/54 (BP Location: Left Arm)   Pulse 66   Temp 98.4 F (36.9 C) (Oral)   Resp 18   Ht 5\' 5"  (1.651 m)   Wt 55.3 kg (122 lb)   SpO2 99%   BMI 20.30 kg/m   Intake/Output Summary (Last 24 hours) at 05/11/17 0921 Last data filed at 05/11/17 0856  Gross per 24 hour  Intake              240 ml  Output              875 ml  Net             -635 ml   Weight change: 3.583 kg (7 lb 14.4 oz)  Physical Exam: GEN lying in bed, sleeping and easily arousable HEENT EOMI, PERRL, MMM NECK JVD improved PULM normal WOB, clear CV RRR loud S2 ABD soft, nontender, nondistended, hypoactive bowel sounds.   EXT  no LE edema NEURO R sided weakness, R facial droop SKIN no rashes or lesions  Imaging: Dg Abd Portable 1v  Result Date: 05/10/2017 CLINICAL DATA:  Nausea. EXAM: PORTABLE ABDOMEN - 1 VIEW COMPARISON:  None. FINDINGS: The bowel gas pattern is normal. Residual contrast is seen in the colon. Phleboliths are noted in the pelvis. IMPRESSION: No evidence of bowel obstruction or ileus. Electronically Signed   By: Marijo Conception, M.D.   On: 05/10/2017 11:31    Labs: BMET  Recent Labs Lab 05/06/17 1735 05/07/17 0240 05/08/17 0520 05/09/17 0436 05/10/17 0310 05/10/17 1041  NA 139 139 140 141 139 139  K 4.0 3.8 3.8 3.6 4.0 3.5  CL 111 112* 108 109 105 107  CO2 18* 12* 22 23 26 23   GLUCOSE 112* 95 79 107* 107* 148*  BUN 73* 68* 64* 58* 57* 55*  CREATININE 6.77* 6.31* 6.03* 5.93* 6.25* 6.26*  CALCIUM 8.8* 8.2* 8.2* 8.4* 8.0* 8.1*  PHOS  --   --  4.7* 4.1  --   --    CBC  Recent Labs Lab  05/08/17 0520 05/09/17 0436 05/10/17 0310 05/10/17 1041  WBC 7.8 10.4 7.6 8.6  HGB 8.1* 9.0* 7.7* 8.0*  HCT 25.4* 28.3* 23.7* 24.8*  MCV 84.9 84.5 85.9 85.8  PLT 122* 137* 112* 117*    Medications:    . amLODipine  10 mg Oral Daily  . aspirin EC  81 mg Oral Daily  . atorvastatin  20 mg Oral QHS  . citalopram  10 mg Oral Daily  . docusate sodium  100 mg Oral Daily  . ferrous sulfate  325 mg Oral BID WC  . heparin  5,000 Units Subcutaneous Q8H  . hydrALAZINE  100 mg Oral TID  . labetalol  200 mg Oral BID  . nortriptyline  25 mg Oral QHS  . pantoprazole  40 mg Oral Daily  . sodium bicarbonate  1,300 mg Oral BID      Madelon Lips, MD [place] pgr [phone] 05/11/2017, 9:21 AM

## 2017-05-11 NOTE — Progress Notes (Signed)
Patient ID: Cassie Peters, female   DOB: 1933-06-29, 83 y.o.   MRN: [id number]  PROGRESS NOTE    Cassie Peters  FXT:[id number] DOB: 1933/06/18 DOA: 05/06/2017 PCP: Jonathon Jordan, MD   Brief Narrative:  83 year old female with history of hypertension, chronic kidney disease stage IV presented with nausea and vomiting for for 5 days. She was found to have acute kidney injury on chronic and disease. Nephrology has evaluated the patient. Vascular surgery has also been consulted for access placement.   Assessment & Plan:   Principal Problem:   Acute renal failure superimposed on stage 4 chronic kidney disease (HCC) Active Problems:   Nausea & vomiting   Hypertensive urgency  Acute kidney injury on CKD stage 4: - Nephrology following. Vascular surgery has been consulted for access placement which would probably happen on 05/14/2017. - Repeat a.m. creatinine       -Renal US- medical renal disease -d/c nephrotoxic ACEi -Strict intake and output  HTN urgency: improved Hold nephrotoxic ACEi Labetalol PRN  N/V - -Zofran PRN -waxes and wanes -Abdomen was negative for ileus on 05/10/2017 -We'll add laxatives  Anemia - Probably anemia of chronic disease. Hemoglobin stable. Repeat a.m. labs  Generalized deconditioning - PT/OT eval  DVT prophylaxis: Heparin Code Status:  Full Family Communication: None at bedside Disposition Plan: Depends on clinical outcome  Consultants: Nephrology and vascular surgery  Procedures: None  Antimicrobials: None    Subjective: Patient seen and examined at bedside. She denies any overnight fever, nausea or vomiting.  Objective: Vitals:   05/10/17 1000 05/10/17 2302 05/11/17 0615 05/11/17 1013  BP: (!) 152/58 (!) 131/50 (!) 148/54 (!) 168/51  Pulse: 68 68 66 62  Resp: 18 18 18 18   Temp: 98.8 F (37.1 C) 98 F (36.7 C) 98.4 F (36.9 C) (!) 97.4 F (36.3 C)  TempSrc: Oral Oral Oral Oral  SpO2: 100% 97% 99% 100%  Weight:  55.3 kg (122  lb)    Height:        Intake/Output Summary (Last 24 hours) at 05/11/17 1055 Last data filed at 05/11/17 0856  Gross per 24 hour  Intake              240 ml  Output              575 ml  Net             -335 ml   Filed Weights   05/08/17 2040 05/09/17 2046 05/10/17 2302  Weight: 51 kg (112 lb 7 oz) 51.8 kg (114 lb 1.6 oz) 55.3 kg (122 lb)    Examination:  General exam: Appears calm and comfortable  Respiratory system: Bilateral decreased breath sound at bases Cardiovascular system: S1 & S2 heard, Rate controlled  Gastrointestinal system: Abdomen is nondistended, soft and nontender. Normal bowel sounds heard. Extremities: No cyanosis, clubbing, edema    Data Reviewed: I have personally reviewed following labs and imaging studies  CBC:  Recent Labs Lab 05/06/17 1735 05/08/17 0520 05/09/17 0436 05/10/17 0310 05/10/17 1041  WBC 7.4 7.8 10.4 7.6 8.6  HGB 9.0* 8.1* 9.0* 7.7* 8.0*  HCT 27.5* 25.4* 28.3* 23.7* 24.8*  MCV 85.4 84.9 84.5 85.9 85.8  PLT 125* 122* 137* 112* 299*   Basic Metabolic Panel:  Recent Labs Lab 05/07/17 0240 05/08/17 0520 05/09/17 0436 05/10/17 0310 05/10/17 1041  NA 139 140 141 139 139  K 3.8 3.8 3.6 4.0 3.5  CL 112* 108 109 105 107  CO2 12* 22  23 26 23   GLUCOSE 95 79 107* 107* 148*  BUN 68* 64* 58* 57* 55*  CREATININE 6.31* 6.03* 5.93* 6.25* 6.26*  CALCIUM 8.2* 8.2* 8.4* 8.0* 8.1*  PHOS  --  4.7* 4.1  --   --    GFR: Estimated Creatinine Clearance: 6 mL/min (A) (by C-G formula based on SCr of 6.26 mg/dL (H)). Liver Function Tests:  Recent Labs Lab 05/06/17 1735 05/08/17 0520 05/09/17 0436  AST 12*  --   --   ALT 6*  --   --   ALKPHOS 56  --   --   BILITOT 0.7  --   --   PROT 6.1*  --   --   ALBUMIN 3.3* 2.8* 3.2*    Recent Labs Lab 05/06/17 1735  LIPASE 36   No results for input(s): AMMONIA in the last 168 hours. Coagulation Profile: No results for input(s): INR, PROTIME in the last 168 hours. Cardiac Enzymes: No  results for input(s): CKTOTAL, CKMB, CKMBINDEX, TROPONINI in the last 168 hours. BNP (last 3 results) No results for input(s): PROBNP in the last 8760 hours. HbA1C: No results for input(s): HGBA1C in the last 72 hours. CBG: No results for input(s): GLUCAP in the last 168 hours. Lipid Profile: No results for input(s): CHOL, HDL, LDLCALC, TRIG, CHOLHDL, LDLDIRECT in the last 72 hours. Thyroid Function Tests: No results for input(s): TSH, T4TOTAL, FREET4, T3FREE, THYROIDAB in the last 72 hours. Anemia Panel: No results for input(s): VITAMINB12, FOLATE, FERRITIN, TIBC, IRON, RETICCTPCT in the last 72 hours. Sepsis Labs: No results for input(s): PROCALCITON, LATICACIDVEN in the last 168 hours.  No results found for this or any previous visit (from the past 240 hour(s)).       Radiology Studies: Dg Abd Portable 1v  Result Date: 05/10/2017 CLINICAL DATA:  Nausea. EXAM: PORTABLE ABDOMEN - 1 VIEW COMPARISON:  None. FINDINGS: The bowel gas pattern is normal. Residual contrast is seen in the colon. Phleboliths are noted in the pelvis. IMPRESSION: No evidence of bowel obstruction or ileus. Electronically Signed   By: Marijo Conception, M.D.   On: 05/10/2017 11:31        Scheduled Meds: . amLODipine  10 mg Oral Daily  . aspirin EC  81 mg Oral Daily  . atorvastatin  20 mg Oral QHS  . citalopram  10 mg Oral Daily  . docusate sodium  100 mg Oral Daily  . ferrous sulfate  325 mg Oral BID WC  . heparin  5,000 Units Subcutaneous Q8H  . hydrALAZINE  100 mg Oral TID  . labetalol  200 mg Oral BID  . nortriptyline  25 mg Oral QHS  . pantoprazole  40 mg Oral Daily  . sodium bicarbonate  1,300 mg Oral BID   Continuous Infusions:   LOS: 4 days        Aline August, MD [facility] Pager [phone]  If 7PM-7AM, please contact night-coverage [email or web address] Password [facility] 05/11/2017, 10:55 AM

## 2017-05-11 NOTE — Progress Notes (Signed)
   Preoperative Note   Procedure: L arm AVF vs AVG  Date: 17 SEP 18  Preoperative diagnosis: chronic kidney disease stage IV-V   Consent: to be obtained  Laboratory:  CBC:    Component Value Date/Time   WBC 8.6 05/10/2017 1041   RBC 2.89 (L) 05/10/2017 1041   HGB 8.0 (L) 05/10/2017 1041   HCT 24.8 (L) 05/10/2017 1041   PLT 117 (L) 05/10/2017 1041   MCV 85.8 05/10/2017 1041   MCH 27.7 05/10/2017 1041   MCHC 32.3 05/10/2017 1041   RDW 15.2 05/10/2017 1041   LYMPHSABS 1.5 11/03/2016 0417   MONOABS 1.0 11/03/2016 0417   EOSABS 0.0 11/03/2016 0417   BASOSABS 0.0 11/03/2016 0417    BMP:    Component Value Date/Time   NA 139 05/10/2017 1041   NA 142 11/13/2016   K 3.5 05/10/2017 1041   CL 107 05/10/2017 1041   CO2 23 05/10/2017 1041   GLUCOSE 148 (H) 05/10/2017 1041   BUN 55 (H) 05/10/2017 1041   BUN 48 (A) 11/13/2016   CREATININE 6.26 (H) 05/10/2017 1041   CALCIUM 8.1 (L) 05/10/2017 1041   GFRNONAA 6 (L) 05/10/2017 1041   GFRAA 6 (L) 05/10/2017 1041    Coagulation: Lab Results  Component Value Date   INR 1.07 11/02/2016   INR 1.0 02/24/2009   INR 1.0 02/24/2009   No results found for: PTT  Cardiology:  EKG: 05/07/17 No acute ischemic changes  CXR: will order   Adele Barthel, MD [facility] Office: [phone] Pager: [phone]  05/11/2017, 2:51 PM

## 2017-05-12 ENCOUNTER — Inpatient Hospital Stay (HOSPITAL_COMMUNITY): Payer: Medicare Other

## 2017-05-12 DIAGNOSIS — N186 End stage renal disease: Secondary | ICD-10-CM

## 2017-05-12 LAB — MAGNESIUM: Magnesium: 1.5 mg/dL — ABNORMAL LOW (ref 1.7–2.4)

## 2017-05-12 LAB — BASIC METABOLIC PANEL
Anion gap: 8 (ref 5–15)
BUN: 53 mg/dL — AB (ref 6–20)
CO2: 24 mmol/L (ref 22–32)
Calcium: 7.6 mg/dL — ABNORMAL LOW (ref 8.9–10.3)
Chloride: 107 mmol/L (ref 101–111)
Creatinine, Ser: 6.6 mg/dL — ABNORMAL HIGH (ref 0.44–1.00)
GFR calc Af Amer: 6 mL/min — ABNORMAL LOW (ref >60)
GFR calc non Af Amer: 5 mL/min — ABNORMAL LOW (ref >60)
GLUCOSE: 87 mg/dL (ref 65–99)
POTASSIUM: 3.7 mmol/L (ref 3.5–5.1)
Sodium: 139 mmol/L (ref 135–145)

## 2017-05-12 LAB — CBC WITH DIFFERENTIAL/PLATELET
Basophils Absolute: 0 10*3/uL (ref 0.0–0.1)
Basophils Relative: 0 %
EOS PCT: 7 %
Eosinophils Absolute: 0.5 10*3/uL (ref 0.0–0.7)
HCT: 22.4 % — ABNORMAL LOW (ref 36.0–46.0)
Hemoglobin: 7.2 g/dL — ABNORMAL LOW (ref 12.0–15.0)
LYMPHS ABS: 2.8 10*3/uL (ref 0.7–4.0)
LYMPHS PCT: 39 %
MCH: 27.9 pg (ref 26.0–34.0)
MCHC: 32.1 g/dL (ref 30.0–36.0)
MCV: 86.8 fL (ref 78.0–100.0)
MONO ABS: 0.5 10*3/uL (ref 0.1–1.0)
MONOS PCT: 6 %
Neutro Abs: 3.4 10*3/uL (ref 1.7–7.7)
Neutrophils Relative %: 48 %
Platelets: 98 10*3/uL — ABNORMAL LOW (ref 150–400)
RBC: 2.58 MIL/uL — AB (ref 3.87–5.11)
RDW: 15.2 % (ref 11.5–15.5)
WBC: 7.1 10*3/uL (ref 4.0–10.5)

## 2017-05-12 NOTE — Progress Notes (Signed)
Patient ID: Cassie Peters, female   DOB: [birth year], 83 y.o.   MRN: [id number] Dr Hollie Salk has requested HD cath as well on Monday at the time of her AV access.  Will add on in the OR

## 2017-05-12 NOTE — Progress Notes (Signed)
[facility] Progress Note    Assessment/ Plan:   1. Acute on chronic kidney disease: I suspect longstanding HTN; also s/p R partial nephrectomy 2009.  Follows with Dr. Moshe Cipro.  Based on her renal ultrasound, I don't think there really is anything reversible here.  Per last note from [place] records, she has been relatively independent despite R sided hemiparesis.  Cr initially looked like it was improving, but now back up to 6.6 and appears as if she's starting to experience uremic symptoms. Access placement for 9/17 with TDC and fistula.  Appreciate VVS assistance.  CLIP in process  2.  N/v- comes and goes. CT scan negative for acute pathology, lipase WNL.  No evidence of UTI.  Zofran prn.  AXR without acute pathology.  Initially not sure related to uremia but ultimately she is probably experiencing uremic symptoms.  3.  Hypertensive emergency: BP is better- on hydralazine 100 mg TID, amlodipine 10 mg daily.  Labetalol 200 mg BID  4.  L complex renal cyst:  Follows with urology- Dr. Alinda Money.  Has been conservatively managed so far.  5  Metabolic acidosis: improved on bicarb tabs  6.  Anemia: on oral iron, Hgb up and down- will likely need ESA, h/o renal carcinoma in 2009 now with conservatively managed complex cysts, weigh risks/ benefits  7.  Bone/ mineral metabolism: PTH 244 9/11, Vit D 18.6.    Subjective:    Sitting in up chair.  Reports she doesn't have any taste for food she used to enjoy which was a problem before she entered the hospital but has gotten worse in the past 24-48 hrs.   Objective:   BP (!) 157/61 (BP Location: Right Wrist)   Pulse 63   Temp 98 F (36.7 C) (Oral)   Resp 16   Ht 5\' 5"  (1.651 m)   Wt 53.1 kg (117 lb)   SpO2 100%   BMI 19.47 kg/m   Intake/Output Summary (Last 24 hours) at 05/12/17 1258 Last data filed at 05/12/17 0600  Gross per 24 hour  Intake              720 ml  Output                0 ml  Net              720 ml    Weight change: -2.268 kg (-5 lb)  Physical Exam: GEN sitting in chair, talking on phone HEENT EOMI, PERRL, MMM NECK JVD improved PULM normal WOB, clear CV RRR loud S2 ABD soft, nontender, nondistended, hypoactive bowel sounds.   EXT no LE edema NEURO R sided weakness, R facial droop SKIN no rashes or lesions  Imaging: No results found.  Labs: BMET  Recent Labs Lab 05/06/17 1735 05/07/17 0240 05/08/17 0520 05/09/17 0436 05/10/17 0310 05/10/17 1041 05/12/17 0447  NA 139 139 140 141 139 139 139  K 4.0 3.8 3.8 3.6 4.0 3.5 3.7  CL 111 112* 108 109 105 107 107  CO2 18* 12* 22 23 26 23 24   GLUCOSE 112* 95 79 107* 107* 148* 87  BUN 73* 68* 64* 58* 57* 55* 53*  CREATININE 6.77* 6.31* 6.03* 5.93* 6.25* 6.26* 6.60*  CALCIUM 8.8* 8.2* 8.2* 8.4* 8.0* 8.1* 7.6*  PHOS  --   --  4.7* 4.1  --   --   --    CBC  Recent Labs Lab 05/09/17 0436 05/10/17 0310 05/10/17 1041 05/12/17 0447  WBC 10.4  7.6 8.6 7.1  NEUTROABS  --   --   --  3.4  HGB 9.0* 7.7* 8.0* 7.2*  HCT 28.3* 23.7* 24.8* 22.4*  MCV 84.5 85.9 85.8 86.8  PLT 137* 112* 117* 98*    Medications:    . amLODipine  10 mg Oral Daily  . aspirin EC  81 mg Oral Daily  . atorvastatin  20 mg Oral QHS  . citalopram  10 mg Oral Daily  . docusate sodium  100 mg Oral BID  . ferrous sulfate  325 mg Oral BID WC  . heparin  5,000 Units Subcutaneous Q8H  . hydrALAZINE  100 mg Oral TID  . labetalol  200 mg Oral BID  . nortriptyline  25 mg Oral QHS  . pantoprazole  40 mg Oral Daily  . polyethylene glycol  17 g Oral Daily  . sodium bicarbonate  1,300 mg Oral BID      Madelon Lips, MD [facility] pgr [phone] 05/12/2017, 12:58 PM

## 2017-05-12 NOTE — Progress Notes (Signed)
Patient ID: Cassie Peters, female   DOB: [birth year], 83 y.o.   MRN: [id number]  PROGRESS NOTE    HEILY CARLUCCI  WGN:[id number] DOB: 10/14/1932 DOA: 05/06/2017 PCP: Jonathon Jordan, MD   Brief Narrative:  83 year old female with history of hypertension, chronic kidney disease stage IV presented with nausea and vomiting for for 5 days. She was found to have acute kidney injury on chronic and disease. Nephrology has evaluated the patient. Vascular surgery has also been consulted for access placement.   Assessment & Plan:   Principal Problem:   Acute renal failure superimposed on stage 4 chronic kidney disease (HCC) Active Problems:   Nausea & vomiting   Hypertensive urgency  Acute kidney injury on CKD stage 4: - Nephrology following. Vascular surgery has been consulted for access placement which would probably happen on 05/14/2017. - Repeat a.m. creatinine       -Renal US- medical renal disease -d/c nephrotoxic ACEi -Strict intake and output  HTN urgency: improved Hold nephrotoxic ACEi Continue amlodipine and labetalol  N/V - -Zofran PRN -waxes and wanes -X-ray Abdomen was negative for ileus on 05/10/2017  Anemia - Probably anemia of chronic disease. Hemoglobin stable. Repeat a.m. labs  Generalized deconditioning - PT/OT eval  Constipation - Continue laxatives  DVT prophylaxis: Heparin Code Status:  Full Family Communication: None at bedside Disposition Plan: Depends on clinical outcome  Consultants: Nephrology and vascular surgery  Procedures: None  Antimicrobials: None    Subjective: Patient seen and examined at bedside. She denies any overnight fever, nausea or vomiting. She had a bowel movement last night  Objective: Vitals:   05/11/17 1933 05/11/17 2038 05/12/17 0030 05/12/17 0431  BP: (!) 142/56 (!) 153/91  111/69  Pulse: (!) 59 (!) 59  (!) 58  Resp: 16 14  14   Temp:  98 F (36.7 C)  98.6 F (37 C)  TempSrc:      SpO2:  100%  100%  Weight:   53.1  kg (117 lb)   Height:        Intake/Output Summary (Last 24 hours) at 05/12/17 1005 Last data filed at 05/12/17 0600  Gross per 24 hour  Intake              720 ml  Output                0 ml  Net              720 ml   Filed Weights   05/09/17 2046 05/10/17 2302 05/12/17 0030  Weight: 51.8 kg (114 lb 1.6 oz) 55.3 kg (122 lb) 53.1 kg (117 lb)    Examination:  General exam: Appears calm and comfortable  Respiratory system: Bilateral decreased breath sound at bases With some scattered crackles Cardiovascular system: S1 & S2 heard, Rate controlled  Gastrointestinal system: Abdomen is nondistended, soft and nontender. Normal bowel sounds heard. Extremities: No cyanosis, clubbing; trace edema  Data Reviewed: I have personally reviewed following labs and imaging studies  CBC:  Recent Labs Lab 05/08/17 0520 05/09/17 0436 05/10/17 0310 05/10/17 1041 05/12/17 0447  WBC 7.8 10.4 7.6 8.6 7.1  NEUTROABS  --   --   --   --  3.4  HGB 8.1* 9.0* 7.7* 8.0* 7.2*  HCT 25.4* 28.3* 23.7* 24.8* 22.4*  MCV 84.9 84.5 85.9 85.8 86.8  PLT 122* 137* 112* 117* 98*   Basic Metabolic Panel:  Recent Labs Lab 05/08/17 0520 05/09/17 0436 05/10/17 0310 05/10/17 1041 05/12/17 0447  NA 140 141 139 139 139  K 3.8 3.6 4.0 3.5 3.7  CL 108 109 105 107 107  CO2 22 23 26 23 24   GLUCOSE 79 107* 107* 148* 87  BUN 64* 58* 57* 55* 53*  CREATININE 6.03* 5.93* 6.25* 6.26* 6.60*  CALCIUM 8.2* 8.4* 8.0* 8.1* 7.6*  MG  --   --   --   --  1.5*  PHOS 4.7* 4.1  --   --   --    GFR: Estimated Creatinine Clearance: 5.5 mL/min (A) (by C-G formula based on SCr of 6.6 mg/dL (H)). Liver Function Tests:  Recent Labs Lab 05/06/17 1735 05/08/17 0520 05/09/17 0436  AST 12*  --   --   ALT 6*  --   --   ALKPHOS 56  --   --   BILITOT 0.7  --   --   PROT 6.1*  --   --   ALBUMIN 3.3* 2.8* 3.2*    Recent Labs Lab 05/06/17 1735  LIPASE 36   No results for input(s): AMMONIA in the last 168  hours. Coagulation Profile: No results for input(s): INR, PROTIME in the last 168 hours. Cardiac Enzymes: No results for input(s): CKTOTAL, CKMB, CKMBINDEX, TROPONINI in the last 168 hours. BNP (last 3 results) No results for input(s): PROBNP in the last 8760 hours. HbA1C: No results for input(s): HGBA1C in the last 72 hours. CBG: No results for input(s): GLUCAP in the last 168 hours. Lipid Profile: No results for input(s): CHOL, HDL, LDLCALC, TRIG, CHOLHDL, LDLDIRECT in the last 72 hours. Thyroid Function Tests: No results for input(s): TSH, T4TOTAL, FREET4, T3FREE, THYROIDAB in the last 72 hours. Anemia Panel: No results for input(s): VITAMINB12, FOLATE, FERRITIN, TIBC, IRON, RETICCTPCT in the last 72 hours. Sepsis Labs: No results for input(s): PROCALCITON, LATICACIDVEN in the last 168 hours.  No results found for this or any previous visit (from the past 240 hour(s)).       Radiology Studies: Dg Abd Portable 1v  Result Date: 05/10/2017 CLINICAL DATA:  Nausea. EXAM: PORTABLE ABDOMEN - 1 VIEW COMPARISON:  None. FINDINGS: The bowel gas pattern is normal. Residual contrast is seen in the colon. Phleboliths are noted in the pelvis. IMPRESSION: No evidence of bowel obstruction or ileus. Electronically Signed   By: Marijo Conception, M.D.   On: 05/10/2017 11:31        Scheduled Meds: . amLODipine  10 mg Oral Daily  . aspirin EC  81 mg Oral Daily  . atorvastatin  20 mg Oral QHS  . citalopram  10 mg Oral Daily  . docusate sodium  100 mg Oral BID  . ferrous sulfate  325 mg Oral BID WC  . heparin  5,000 Units Subcutaneous Q8H  . hydrALAZINE  100 mg Oral TID  . labetalol  200 mg Oral BID  . nortriptyline  25 mg Oral QHS  . pantoprazole  40 mg Oral Daily  . polyethylene glycol  17 g Oral Daily  . sodium bicarbonate  1,300 mg Oral BID   Continuous Infusions:   LOS: 5 days        Aline August, MD [facility] Pager [phone]  If 7PM-7AM, please contact  night-coverage [email or web address] Password [email or web address] 05/12/2017, 10:05 AM

## 2017-05-12 NOTE — Progress Notes (Signed)
Right  Upper Extremity Vein Map    Cephalic  Segment Diameter Depth Comment  1. Axilla 3.57mm 44mm   2. Mid upper arm 3.60mm 46mm   3. Above AC 3.20mm 3.34mm   4. In AC 3.34mm 2.9mm   5. Below AC 2.58mm 50mm branch  6. Mid forearm 2.70mm 3.48mm   7. Wrist 1.34mm 3.55mm    mm mm    mm mm    mm mm     Left Upper Extremity Vein Map    Cephalic  Segment Diameter Depth Comment  1. Axilla 3.20mm 66mm   2. Mid upper arm 3.69mm 3.23mm branch  3. Above AC 2.106mm 3.31mm   4. In AC 2.23mm 2.21mm   5. Below AC 2.32mm 1.25mm   6. Mid forearm 2.85mm 1.29mm   7. Wrist 1.75mm 2.46mm    mm mm    mm mm    mm mm

## 2017-05-13 DIAGNOSIS — D696 Thrombocytopenia, unspecified: Secondary | ICD-10-CM

## 2017-05-13 LAB — BASIC METABOLIC PANEL
ANION GAP: 9 (ref 5–15)
BUN: 52 mg/dL — ABNORMAL HIGH (ref 6–20)
CALCIUM: 8 mg/dL — AB (ref 8.9–10.3)
CO2: 25 mmol/L (ref 22–32)
Chloride: 106 mmol/L (ref 101–111)
Creatinine, Ser: 7.06 mg/dL — ABNORMAL HIGH (ref 0.44–1.00)
GFR calc non Af Amer: 5 mL/min — ABNORMAL LOW (ref >60)
GFR, EST AFRICAN AMERICAN: 6 mL/min — AB (ref >60)
GLUCOSE: 85 mg/dL (ref 65–99)
Potassium: 3.6 mmol/L (ref 3.5–5.1)
SODIUM: 140 mmol/L (ref 135–145)

## 2017-05-13 LAB — MAGNESIUM: Magnesium: 1.6 mg/dL — ABNORMAL LOW (ref 1.7–2.4)

## 2017-05-13 MED ORDER — SODIUM CHLORIDE 0.9 % IV SOLN: 100.0000 mL | INTRAVENOUS | Status: DC | PRN

## 2017-05-13 MED ORDER — LIDOCAINE HCL (PF) 1 % IJ SOLN: 5.0000 mL | INTRAMUSCULAR | Status: DC | PRN

## 2017-05-13 MED ORDER — LIDOCAINE-PRILOCAINE 2.5-2.5 % EX CREA: 1.0000 "application " | TOPICAL_CREAM | CUTANEOUS | Status: DC | PRN

## 2017-05-13 MED ORDER — ALTEPLASE 2 MG IJ SOLR: 2.0000 mg | Freq: Once | INTRAMUSCULAR | Status: DC | PRN

## 2017-05-13 MED ORDER — MAGNESIUM SULFATE 2 GM/50ML IV SOLN
2.0000 g | Freq: Once | INTRAVENOUS | Status: AC
Start: 2017-05-13 — End: 2017-05-13
  Administered 2017-05-13: 2 g via INTRAVENOUS
  Filled 2017-05-13: qty 50

## 2017-05-13 MED ORDER — HEPARIN SODIUM (PORCINE) 1000 UNIT/ML DIALYSIS: 1000.0000 [IU] | INTRAMUSCULAR | Status: DC | PRN

## 2017-05-13 MED ORDER — PENTAFLUOROPROP-TETRAFLUOROETH EX AERO: 1.0000 "application " | INHALATION_SPRAY | CUTANEOUS | Status: DC | PRN

## 2017-05-13 NOTE — Progress Notes (Signed)
[facility] Progress Note    Assessment/ Plan:   1. Acute on chronic kidney disease--> ESRD: I suspect longstanding HTN; also s/p R partial nephrectomy 2009.  Follows with Dr. Moshe Cipro.  Based on her renal ultrasound, I don't think there really is anything reversible here.  Per last note from [facility] records, she has been relatively independent despite R sided hemiparesis.  Cr initially looked like it was improving, but now back up to 6.6 and appears as if she's starting to experience uremic symptoms. Access placement for 9/17 with TDC and fistula.  Appreciate VVS assistance.  CLIP in process.  For HD #1 9/17  2.  N/v- workup per primary negative.  Suspect uremia.    3.  Hypertensive emergency: BP is better- on hydralazine 100 mg TID, amlodipine 10 mg daily.  Labetalol 200 mg BID  4.  L complex renal cyst:  Follows with urology- Dr. Alinda Money.  Has been conservatively managed so far.  5  Metabolic acidosis: improved on bicarb tabs  6.  Anemia: on oral iron, Hgb up and down- will likely need ESA, h/o renal carcinoma in 2009 now with conservatively managed complex cysts, weigh risks/ benefits  7.  Bone/ mineral metabolism: PTH 244 9/11, Vit D 18.6.  Start low-dose calcitriol with HD.  Subjective:    Lying in bed- got sick overnight with n/v.  For [facility] and fistula tomorrow and HD #1.     Objective:   BP (!) 159/59 (BP Location: Right Wrist)   Pulse 69   Temp 99.7 F (37.6 C) (Oral)   Resp 16   Ht 5\' 5"  (1.651 m)   Wt 53.5 kg (118 lb)   SpO2 98%   BMI 19.64 kg/m   Intake/Output Summary (Last 24 hours) at 05/13/17 1307 Last data filed at 05/13/17 1007  Gross per 24 hour  Intake              540 ml  Output              200 ml  Net              340 ml   Weight change: 0.454 kg (1 lb)  Physical Exam: GEN  Lying in bed, curled up in a ball HEENT EOMI, PERRL, MMM NECK + JVD today PULM normal WOB, clear CV RRR loud S2 ABD soft, nontender, nondistended, NABS EXT  no LE edema NEURO R sided weakness, R facial droop SKIN no rashes or lesions  Imaging: No results found.  Labs: BMET  Recent Labs Lab 05/07/17 0240 05/08/17 0520 05/09/17 0436 05/10/17 0310 05/10/17 1041 05/12/17 0447 05/13/17 0551  NA 139 140 141 139 139 139 140  K 3.8 3.8 3.6 4.0 3.5 3.7 3.6  CL 112* 108 109 105 107 107 106  CO2 12* 22 23 26 23 24 25   GLUCOSE 95 79 107* 107* 148* 87 85  BUN 68* 64* 58* 57* 55* 53* 52*  CREATININE 6.31* 6.03* 5.93* 6.25* 6.26* 6.60* 7.06*  CALCIUM 8.2* 8.2* 8.4* 8.0* 8.1* 7.6* 8.0*  PHOS  --  4.7* 4.1  --   --   --   --    CBC  Recent Labs Lab 05/09/17 0436 05/10/17 0310 05/10/17 1041 05/12/17 0447  WBC 10.4 7.6 8.6 7.1  NEUTROABS  --   --   --  3.4  HGB 9.0* 7.7* 8.0* 7.2*  HCT 28.3* 23.7* 24.8* 22.4*  MCV 84.5 85.9 85.8 86.8  PLT 137* 112* 117*  98*    Medications:    . amLODipine  10 mg Oral Daily  . aspirin EC  81 mg Oral Daily  . atorvastatin  20 mg Oral QHS  . citalopram  10 mg Oral Daily  . docusate sodium  100 mg Oral BID  . ferrous sulfate  325 mg Oral BID WC  . heparin  5,000 Units Subcutaneous Q8H  . hydrALAZINE  100 mg Oral TID  . labetalol  200 mg Oral BID  . nortriptyline  25 mg Oral QHS  . pantoprazole  40 mg Oral Daily  . polyethylene glycol  17 g Oral Daily  . sodium bicarbonate  1,300 mg Oral BID      Madelon Lips, MD [facility] pgr [phone] 05/13/2017, 1:07 PM

## 2017-05-13 NOTE — Progress Notes (Signed)
Patient ID: Cassie Peters, female   DOB: Dec 02, 1932, 83 y.o.   MRN: [id number]  PROGRESS NOTE    Cassie Peters  NWG:[id number] DOB: 06-01-1933 DOA: 05/06/2017 PCP: Jonathon Jordan, MD   Brief Narrative:  83 year old female with history of hypertension, chronic kidney disease stage IV presented with nausea and vomiting for for 5 days. She was found to have acute kidney injury on chronic and disease. Nephrology has evaluated the patient. Vascular surgery has also been consulted for access placement.   Assessment & Plan:   Principal Problem:   Acute renal failure superimposed on stage 4 chronic kidney disease (HCC) Active Problems:   Nausea & vomiting   Hypertensive urgency  Acute kidney injury on CKD stage 4: - Nephrology following. Vascular surgery has been consulted for access placement which would probably happen on 05/14/2017. - Repeat a.m. creatinine       -Renal US- medical renal disease -d/c nephrotoxic ACEi -Strict intake and output  HTN urgency: improved Hold nephrotoxic ACEi Continue amlodipine and labetalol. Blood pressure on the higher side; we'll monitor  N/V - -Zofran PRN -waxes and wanes -X-ray Abdomen was negative for ileus on 05/10/2017  Anemia - Probably anemia of chronic disease. Hemoglobin stable. Repeat a.m. labs  Generalized deconditioning - PT/OT eval  Constipation - Continue laxatives  Hypomagnesemia - Replace. Repeat a.m. Labs  Thrombocytopenia - Questionable cause. Stable. Repeat a.m. labs  DVT prophylaxis: Heparin Code Status:  Full Family Communication: None at bedside Disposition Plan: Depends on clinical outcome  Consultants: Nephrology and vascular surgery  Procedures: None  Antimicrobials: None    Subjective: Patient seen and examined at bedside. She denies any overnight fever, nausea or vomiting. She did not have a bowel movement yesterday.   Objective: Vitals:   05/12/17 1819 05/12/17 2041 05/13/17 0508 05/13/17 0818    BP: (!) 157/55 (!) 163/54 (!) 180/64 (!) 159/59  Pulse: 60 66 71 69  Resp: 16 16 16 16   Temp: 97.8 F (36.6 C) 98.6 F (37 C) 98.8 F (37.1 C) 99.7 F (37.6 C)  TempSrc: Oral Oral Oral Oral  SpO2: 100% 100% 99% 98%  Weight:  53.5 kg (118 lb)    Height:        Intake/Output Summary (Last 24 hours) at 05/13/17 1022 Last data filed at 05/13/17 1007  Gross per 24 hour  Intake              540 ml  Output              200 ml  Net              340 ml   Filed Weights   05/10/17 2302 05/12/17 0030 05/12/17 2041  Weight: 55.3 kg (122 lb) 53.1 kg (117 lb) 53.5 kg (118 lb)    Examination:  General exam: Appears calm and comfortable; Flat affect Respiratory system: Bilateral decreased breath sound at bases With some scattered crackles Cardiovascular system: S1 & S2 heard, Rate controlled  Gastrointestinal system: Abdomen is nondistended, soft and nontender. Normal bowel sounds heard. Extremities: No cyanosis, clubbing; trace edema  Data Reviewed: I have personally reviewed following labs and imaging studies  CBC:  Recent Labs Lab 05/08/17 0520 05/09/17 0436 05/10/17 0310 05/10/17 1041 05/12/17 0447  WBC 7.8 10.4 7.6 8.6 7.1  NEUTROABS  --   --   --   --  3.4  HGB 8.1* 9.0* 7.7* 8.0* 7.2*  HCT 25.4* 28.3* 23.7* 24.8* 22.4*  MCV 84.9 84.5  85.9 85.8 86.8  PLT 122* 137* 112* 117* 98*   Basic Metabolic Panel:  Recent Labs Lab 05/08/17 0520 05/09/17 0436 05/10/17 0310 05/10/17 1041 05/12/17 0447 05/13/17 0551  NA 140 141 139 139 139 140  K 3.8 3.6 4.0 3.5 3.7 3.6  CL 108 109 105 107 107 106  CO2 22 23 26 23 24 25   GLUCOSE 79 107* 107* 148* 87 85  BUN 64* 58* 57* 55* 53* 52*  CREATININE 6.03* 5.93* 6.25* 6.26* 6.60* 7.06*  CALCIUM 8.2* 8.4* 8.0* 8.1* 7.6* 8.0*  MG  --   --   --   --  1.5* 1.6*  PHOS 4.7* 4.1  --   --   --   --    GFR: Estimated Creatinine Clearance: 5.2 mL/min (A) (by C-G formula based on SCr of 7.06 mg/dL (H)). Liver Function Tests:  Recent  Labs Lab 05/06/17 1735 05/08/17 0520 05/09/17 0436  AST 12*  --   --   ALT 6*  --   --   ALKPHOS 56  --   --   BILITOT 0.7  --   --   PROT 6.1*  --   --   ALBUMIN 3.3* 2.8* 3.2*    Recent Labs Lab 05/06/17 1735  LIPASE 36   No results for input(s): AMMONIA in the last 168 hours. Coagulation Profile: No results for input(s): INR, PROTIME in the last 168 hours. Cardiac Enzymes: No results for input(s): CKTOTAL, CKMB, CKMBINDEX, TROPONINI in the last 168 hours. BNP (last 3 results) No results for input(s): PROBNP in the last 8760 hours. HbA1C: No results for input(s): HGBA1C in the last 72 hours. CBG: No results for input(s): GLUCAP in the last 168 hours. Lipid Profile: No results for input(s): CHOL, HDL, LDLCALC, TRIG, CHOLHDL, LDLDIRECT in the last 72 hours. Thyroid Function Tests: No results for input(s): TSH, T4TOTAL, FREET4, T3FREE, THYROIDAB in the last 72 hours. Anemia Panel: No results for input(s): VITAMINB12, FOLATE, FERRITIN, TIBC, IRON, RETICCTPCT in the last 72 hours. Sepsis Labs: No results for input(s): PROCALCITON, LATICACIDVEN in the last 168 hours.  No results found for this or any previous visit (from the past 240 hour(s)).       Radiology Studies: No results found.      Scheduled Meds: . amLODipine  10 mg Oral Daily  . aspirin EC  81 mg Oral Daily  . atorvastatin  20 mg Oral QHS  . citalopram  10 mg Oral Daily  . docusate sodium  100 mg Oral BID  . ferrous sulfate  325 mg Oral BID WC  . heparin  5,000 Units Subcutaneous Q8H  . hydrALAZINE  100 mg Oral TID  . labetalol  200 mg Oral BID  . nortriptyline  25 mg Oral QHS  . pantoprazole  40 mg Oral Daily  . polyethylene glycol  17 g Oral Daily  . sodium bicarbonate  1,300 mg Oral BID   Continuous Infusions:   LOS: 6 days        Aline August, MD [facility] Pager [phone]  If 7PM-7AM, please contact night-coverage [email or web address] Password [facility] 05/13/2017, 10:22 AM

## 2017-05-14 ENCOUNTER — Inpatient Hospital Stay (HOSPITAL_COMMUNITY): Payer: Medicare Other

## 2017-05-14 ENCOUNTER — Telehealth: Payer: Self-pay | Admitting: Vascular Surgery

## 2017-05-14 ENCOUNTER — Inpatient Hospital Stay (HOSPITAL_COMMUNITY): Payer: Medicare Other | Admitting: Certified Registered Nurse Anesthetist

## 2017-05-14 ENCOUNTER — Encounter (HOSPITAL_COMMUNITY)
Admission: EM | Disposition: A | Discharge status: Home Health | Payer: Self-pay | Source: Home / Self Care | Attending: Internal Medicine

## 2017-05-14 DIAGNOSIS — N185 Chronic kidney disease, stage 5: Secondary | ICD-10-CM

## 2017-05-14 HISTORY — PX: INSERTION OF DIALYSIS CATHETER: SHX1324

## 2017-05-14 HISTORY — PX: AV FISTULA PLACEMENT: SHX1204

## 2017-05-14 LAB — BASIC METABOLIC PANEL
Anion gap: 10 (ref 5–15)
BUN: 52 mg/dL — ABNORMAL HIGH (ref 6–20)
CHLORIDE: 105 mmol/L (ref 101–111)
CO2: 26 mmol/L (ref 22–32)
Calcium: 8.2 mg/dL — ABNORMAL LOW (ref 8.9–10.3)
Creatinine, Ser: 6.97 mg/dL — ABNORMAL HIGH (ref 0.44–1.00)
GFR calc non Af Amer: 5 mL/min — ABNORMAL LOW (ref >60)
GFR, EST AFRICAN AMERICAN: 6 mL/min — AB (ref >60)
Glucose, Bld: 95 mg/dL (ref 65–99)
POTASSIUM: 4 mmol/L (ref 3.5–5.1)
SODIUM: 141 mmol/L (ref 135–145)

## 2017-05-14 LAB — RENAL FUNCTION PANEL
ALBUMIN: 2.8 g/dL — AB (ref 3.5–5.0)
ANION GAP: 10 (ref 5–15)
BUN: 24 mg/dL — ABNORMAL HIGH (ref 6–20)
CHLORIDE: 102 mmol/L (ref 101–111)
CO2: 26 mmol/L (ref 22–32)
Calcium: 8 mg/dL — ABNORMAL LOW (ref 8.9–10.3)
Creatinine, Ser: 4.15 mg/dL — ABNORMAL HIGH (ref 0.44–1.00)
GFR calc Af Amer: 11 mL/min — ABNORMAL LOW (ref >60)
GFR calc non Af Amer: 9 mL/min — ABNORMAL LOW (ref >60)
Glucose, Bld: 81 mg/dL (ref 65–99)
Phosphorus: 2.9 mg/dL (ref 2.5–4.6)
Potassium: 3.4 mmol/L — ABNORMAL LOW (ref 3.5–5.1)
SODIUM: 138 mmol/L (ref 135–145)

## 2017-05-14 LAB — CBC WITH DIFFERENTIAL/PLATELET
Basophils Absolute: 0 10*3/uL (ref 0.0–0.1)
Basophils Relative: 0 %
EOS ABS: 0.4 10*3/uL (ref 0.0–0.7)
Eosinophils Relative: 6 %
HCT: 23.5 % — ABNORMAL LOW (ref 36.0–46.0)
HEMOGLOBIN: 7.5 g/dL — AB (ref 12.0–15.0)
LYMPHS ABS: 2.8 10*3/uL (ref 0.7–4.0)
LYMPHS PCT: 38 %
MCH: 27.7 pg (ref 26.0–34.0)
MCHC: 31.9 g/dL (ref 30.0–36.0)
MCV: 86.7 fL (ref 78.0–100.0)
Monocytes Absolute: 0.5 10*3/uL (ref 0.1–1.0)
Monocytes Relative: 7 %
NEUTROS PCT: 49 %
Neutro Abs: 3.6 10*3/uL (ref 1.7–7.7)
Platelets: 95 10*3/uL — ABNORMAL LOW (ref 150–400)
RBC: 2.71 MIL/uL — AB (ref 3.87–5.11)
RDW: 14.9 % (ref 11.5–15.5)
WBC: 7.3 10*3/uL (ref 4.0–10.5)

## 2017-05-14 LAB — CBC
HEMATOCRIT: 24.2 % — AB (ref 36.0–46.0)
HEMOGLOBIN: 7.6 g/dL — AB (ref 12.0–15.0)
MCH: 27.4 pg (ref 26.0–34.0)
MCHC: 31.4 g/dL (ref 30.0–36.0)
MCV: 87.4 fL (ref 78.0–100.0)
Platelets: 93 10*3/uL — ABNORMAL LOW (ref 150–400)
RBC: 2.77 MIL/uL — AB (ref 3.87–5.11)
RDW: 14.9 % (ref 11.5–15.5)
WBC: 9.3 10*3/uL (ref 4.0–10.5)

## 2017-05-14 LAB — PROTIME-INR
INR: 0.95
Prothrombin Time: 12.6 seconds (ref 11.4–15.2)

## 2017-05-14 LAB — APTT: APTT: 26 s (ref 24–36)

## 2017-05-14 LAB — SURGICAL PCR SCREEN
MRSA, PCR: NEGATIVE
STAPHYLOCOCCUS AUREUS: NEGATIVE

## 2017-05-14 LAB — MAGNESIUM: MAGNESIUM: 2.3 mg/dL (ref 1.7–2.4)

## 2017-05-14 SURGERY — ARTERIOVENOUS (AV) FISTULA CREATION
Anesthesia: Monitor Anesthesia Care | Site: Neck | Laterality: Right

## 2017-05-14 MED ORDER — PROPOFOL 500 MG/50ML IV EMUL
INTRAVENOUS | Status: DC | PRN
  Administered 2017-05-14: 50 ug/kg/min via INTRAVENOUS

## 2017-05-14 MED ORDER — HEPARIN SODIUM (PORCINE) 1000 UNIT/ML IJ SOLN
INTRAMUSCULAR | Status: AC
  Filled 2017-05-14: qty 1

## 2017-05-14 MED ORDER — FENTANYL CITRATE (PF) 100 MCG/2ML IJ SOLN: 25.0000 ug | INTRAMUSCULAR | Status: DC | PRN

## 2017-05-14 MED ORDER — DEXTROSE 5 % IV SOLN
INTRAVENOUS | Status: DC | PRN
  Administered 2017-05-14: 1.5 g via INTRAVENOUS

## 2017-05-14 MED ORDER — HEPARIN SODIUM (PORCINE) 5000 UNIT/ML IJ SOLN
INTRAMUSCULAR | Status: DC | PRN
  Administered 2017-05-14: 500 mL

## 2017-05-14 MED ORDER — LIDOCAINE HCL (PF) 1 % IJ SOLN
INTRAMUSCULAR | Status: AC
  Filled 2017-05-14: qty 30

## 2017-05-14 MED ORDER — SODIUM CHLORIDE 0.9 % IV SOLN
INTRAVENOUS | Status: DC
  Administered 2017-05-14: 12:00:00 via INTRAVENOUS

## 2017-05-14 MED ORDER — LIDOCAINE-EPINEPHRINE 1 %-1:100000 IJ SOLN
10.0000 mL | Freq: Once | INTRAMUSCULAR | Status: DC
  Filled 2017-05-14: qty 10

## 2017-05-14 MED ORDER — 0.9 % SODIUM CHLORIDE (POUR BTL) OPTIME
TOPICAL | Status: DC | PRN
  Administered 2017-05-14: 1000 mL

## 2017-05-14 MED ORDER — LIDOCAINE-EPINEPHRINE (PF) 1 %-1:200000 IJ SOLN
5.0000 mL | Freq: Once | INTRAMUSCULAR | Status: DC
  Filled 2017-05-14: qty 10

## 2017-05-14 MED ORDER — MEPERIDINE HCL 25 MG/ML IJ SOLN: 6.2500 mg | INTRAMUSCULAR | Status: DC | PRN

## 2017-05-14 MED ORDER — LIDOCAINE HCL (PF) 1 % IJ SOLN
INTRAMUSCULAR | Status: DC | PRN
  Administered 2017-05-14: 12 mL

## 2017-05-14 MED ORDER — OXYCODONE-ACETAMINOPHEN 5-325 MG PO TABS
1.0000 | ORAL_TABLET | Freq: Four times a day (QID) | ORAL | Status: DC | PRN
  Administered 2017-05-14 – 2017-05-15 (×2): 1 via ORAL
  Filled 2017-05-14 (×2): qty 1

## 2017-05-14 MED ORDER — CALCITRIOL 0.25 MCG PO CAPS
0.2500 ug | ORAL_CAPSULE | ORAL | Status: DC
  Administered 2017-05-15 – 2017-05-18 (×3): 0.25 ug via ORAL
  Filled 2017-05-14 (×4): qty 1

## 2017-05-14 MED ORDER — HEPARIN SODIUM (PORCINE) 1000 UNIT/ML IJ SOLN
INTRAMUSCULAR | Status: DC | PRN
  Administered 2017-05-14: 3400 [IU]

## 2017-05-14 SURGICAL SUPPLY — 62 items
ADH SKN CLS APL DERMABOND .7 (GAUZE/BANDAGES/DRESSINGS) ×4
AGENT HMST SPONGE THK3/8 (HEMOSTASIS)
ARMBAND PINK RESTRICT EXTREMIT (MISCELLANEOUS) ×5 IMPLANT
BAG DECANTER FOR FLEXI CONT (MISCELLANEOUS) ×3 IMPLANT
BIOPATCH RED 1 DISK 7.0 (GAUZE/BANDAGES/DRESSINGS) ×3 IMPLANT
CANISTER SUCT 3000ML PPV (MISCELLANEOUS) ×3 IMPLANT
CATH PALINDROME RT-P 15FX19CM (CATHETERS) IMPLANT
CATH PALINDROME RT-P 15FX23CM (CATHETERS) ×1 IMPLANT
CATH PALINDROME RT-P 15FX28CM (CATHETERS) IMPLANT
CATH PALINDROME RT-P 15FX55CM (CATHETERS) IMPLANT
CATH STRAIGHT 5FR 65CM (CATHETERS) IMPLANT
CLIP VESOCCLUDE MED 6/CT (CLIP) ×3 IMPLANT
CLIP VESOCCLUDE SM WIDE 6/CT (CLIP) ×3 IMPLANT
COVER PROBE W GEL 5X96 (DRAPES) ×4 IMPLANT
COVER SURGICAL LIGHT HANDLE (MISCELLANEOUS) ×4 IMPLANT
DECANTER SPIKE VIAL GLASS SM (MISCELLANEOUS) ×3 IMPLANT
DERMABOND ADVANCED (GAUZE/BANDAGES/DRESSINGS) ×2
DERMABOND ADVANCED .7 DNX12 (GAUZE/BANDAGES/DRESSINGS) ×2 IMPLANT
DRAPE C-ARM 42X72 X-RAY (DRAPES) ×3 IMPLANT
DRAPE CHEST BREAST 15X10 FENES (DRAPES) ×3 IMPLANT
ELECT REM PT RETURN 9FT ADLT (ELECTROSURGICAL) ×3
ELECTRODE REM PT RTRN 9FT ADLT (ELECTROSURGICAL) ×2 IMPLANT
GAUZE SPONGE 4X4 16PLY XRAY LF (GAUZE/BANDAGES/DRESSINGS) ×1 IMPLANT
GLOVE BIO SURGEON STRL SZ 6.5 (GLOVE) ×2 IMPLANT
GLOVE BIO SURGEON STRL SZ7 (GLOVE) ×4 IMPLANT
GLOVE BIOGEL PI IND STRL 6.5 (GLOVE) ×5 IMPLANT
GLOVE BIOGEL PI IND STRL 7.5 (GLOVE) ×2 IMPLANT
GLOVE BIOGEL PI INDICATOR 6.5 (GLOVE) ×5
GLOVE BIOGEL PI INDICATOR 7.5 (GLOVE) ×2
GLOVE SURG SS PI 6.5 STRL IVOR (GLOVE) ×1 IMPLANT
GOWN STRL NON-REIN LRG LVL3 (GOWN DISPOSABLE) ×1 IMPLANT
GOWN STRL REUS W/ TWL LRG LVL3 (GOWN DISPOSABLE) ×7 IMPLANT
GOWN STRL REUS W/TWL LRG LVL3 (GOWN DISPOSABLE) ×12
HEMOSTAT SPONGE AVITENE ULTRA (HEMOSTASIS) IMPLANT
KIT BASIN OR (CUSTOM PROCEDURE TRAY) ×3 IMPLANT
KIT ROOM TURNOVER OR (KITS) ×3 IMPLANT
NDL 18GX1X1/2 (RX/OR ONLY) (NEEDLE) ×2 IMPLANT
NDL HYPO 25GX1X1/2 BEV (NEEDLE) ×2 IMPLANT
NEEDLE 18GX1X1/2 (RX/OR ONLY) (NEEDLE) ×3 IMPLANT
NEEDLE HYPO 25GX1X1/2 BEV (NEEDLE) ×3 IMPLANT
NS IRRIG 1000ML POUR BTL (IV SOLUTION) ×3 IMPLANT
PACK CV ACCESS (CUSTOM PROCEDURE TRAY) ×3 IMPLANT
PACK SURGICAL SETUP 50X90 (CUSTOM PROCEDURE TRAY) ×3 IMPLANT
PAD ARMBOARD 7.5X6 YLW CONV (MISCELLANEOUS) ×6 IMPLANT
SET MICROPUNCTURE 5F STIFF (MISCELLANEOUS) IMPLANT
SOAP 2 % CHG 4 OZ (WOUND CARE) ×3 IMPLANT
SUT ETHILON 3 0 PS 1 (SUTURE) ×3 IMPLANT
SUT MNCRL AB 4-0 PS2 18 (SUTURE) ×4 IMPLANT
SUT PROLENE 6 0 BV (SUTURE) ×2 IMPLANT
SUT PROLENE 7 0 BV 1 (SUTURE) ×3 IMPLANT
SUT VIC AB 3-0 SH 27 (SUTURE) ×3
SUT VIC AB 3-0 SH 27X BRD (SUTURE) ×2 IMPLANT
SYR 10ML LL (SYRINGE) ×3 IMPLANT
SYR 20CC LL (SYRINGE) ×4 IMPLANT
SYR 3ML LL SCALE MARK (SYRINGE) ×1 IMPLANT
SYR 5ML LL (SYRINGE) ×3 IMPLANT
SYR CONTROL 10ML LL (SYRINGE) ×2 IMPLANT
SYRINGE 20CC LL (MISCELLANEOUS) ×1 IMPLANT
TOWEL GREEN STERILE FF (TOWEL DISPOSABLE) ×3 IMPLANT
UNDERPAD 30X30 (UNDERPADS AND DIAPERS) ×3 IMPLANT
WATER STERILE IRR 1000ML POUR (IV SOLUTION) ×3 IMPLANT
WIRE AMPLATZ SS-J .035X180CM (WIRE) IMPLANT

## 2017-05-14 NOTE — Transfer of Care (Signed)
Immediate Anesthesia Transfer of Care Note  Patient: Cassie Peters  Procedure(s) Performed: Procedure(s): LEFT ARM ARTERIOVENOUS (AV) FISTULA CREATION (Left) INSERTION OF DIALYSIS CATHETER RIGHT INTERNAL JUGULAR (Right)  Patient Location: PACU  Anesthesia Type:MAC  Level of Consciousness: drowsy  Airway & Oxygen Therapy: Patient Spontanous Breathing  Post-op Assessment: Report given to RN and Post -op Vital signs reviewed and stable  Post vital signs: Reviewed and stable  Last Vitals:  Vitals:   05/14/17 1344 05/14/17 1345  BP: (!) 145/55   Pulse: (!) 52 (!) 52  Resp: 15   Temp: (!) 36.1 C   SpO2: 100% 100%    Last Pain:  Vitals:   05/14/17 0815  TempSrc: Oral  PainSc:          Complications: No apparent anesthesia complications

## 2017-05-14 NOTE — Anesthesia Procedure Notes (Signed)
Procedure Name: MAC Performed by: Lessa Huge B Pre-anesthesia Checklist: Patient identified, Emergency Drugs available, Suction available, Patient being monitored and Timeout performed Patient Re-evaluated:Patient Re-evaluated prior to inductionOxygen Delivery Method: Simple face mask Placement Confirmation: positive ETCO2 Dental Injury: Teeth and Oropharynx as per pre-operative assessment        

## 2017-05-14 NOTE — Progress Notes (Signed)
Saturated guaze with blood noted on the right neck where newly HD cath was placed, rapid response called for second set of eyes, Called Dr. Oneida Alar, suggested to put pressure and was going to come and check the patient.  Rapid response held pressure for 10-15 and covered with dressing.  Will continue to monitor.

## 2017-05-14 NOTE — Progress Notes (Signed)
Bleeding noted on the neck from the newly placed HD catheter; Dr. Oneida Alar paged and made aware; suggested that pressure be applied on the site; Dr. Oneida Alar was told that the pt is done with hemodialysis and will be going back to her room. Dr. Oneida Alar was asked if pt shd be kept on the HD to be assessed by him; he stated he would be able to assess the pt in a hour's time and to send the pt back to her room.

## 2017-05-14 NOTE — Progress Notes (Signed)
OT Cancellation Note  Patient Details Name: Cassie Peters MRN: [id number] DOB: Oct 06, 1932   Cancelled Treatment:    Reason Eval/Treat Not Completed: Patient at procedure or test/ unavailable. Pt in diagnostic for Fluoro Guide CV Line.    Almon Register [phone] 05/14/2017, 1:06 PM

## 2017-05-14 NOTE — Progress Notes (Signed)
PT Cancellation Note  Patient Details Name: Cassie Peters MRN: [id number] DOB: Dec 29, 1932   Cancelled Treatment:    Reason Eval/Treat Not Completed: Patient at procedure or test/unavailable   Off the floor for HD access;   Will follow up later today as time allows;  Otherwise, will follow up for PT tomorrow;   Thank you,  Roney Marion, PT  Acute Rehabilitation Services Pager [phone] Office [phone]     Colletta Maryland 05/14/2017, 12:11 PM

## 2017-05-14 NOTE — Telephone Encounter (Signed)
Sched appt 07/06/17 at 10:30. vm full, mailed appt letter.

## 2017-05-14 NOTE — Interval H&P Note (Signed)
   History and Physical Update  The patient was interviewed and re-examined.  The patient's previous History and Physical has been reviewed and is unchanged from my consult except for: interval progression into end stage renal disease.  Plan is: Left arm access, possible tunneled dialysis catheter placement.   Risk, benefits, and alternatives to access surgery were discussed.    The patient is aware the risks include but are not limited to: bleeding, infection, steal syndrome, nerve damage, ischemic monomelic neuropathy, thrombosis, failure to mature, complications related to venous hypertension, need for additional procedures, death and stroke.    The patient is aware the risks of tunneled dialysis catheter placement include but are not limited to: bleeding, infection, central venous injury, pneumothorax, possible venous stenosis, possible malpositioning in the venous system, and possible infections related to long-term catheter presence.   The patient was aware of these risks and agreed to proceed.  Adele Barthel, MD, FACS [facility] [place] Office: [phone] Pager: [phone]  05/14/2017, 11:26 AM

## 2017-05-14 NOTE — Op Note (Signed)
OPERATIVE NOTE  PROCEDURE: 1.  Right internal jugular vein tunneled dialysis catheter placement 2.  Right internal jugular vein cannulation under ultrasound guidance 3.  Left brachiocephalic arteriovenous fistula placement  PRE-OPERATIVE DIAGNOSIS: end-stage renal failure  POST-OPERATIVE DIAGNOSIS: same as above  SURGEON: Adele Barthel, MD  ANESTHESIA: local and MAC  ESTIMATED BLOOD LOSS: 30 cc  FINDING(S): 1.  Tips of the catheter in the right atrium on fluoroscopy 2.  No obvious pneumothorax on fluoroscopy 3.  Cephalic vein: 4-7.0 mm, acceptable 4.  Brachial artery: 4 mm, atherosclerotic disease evident 5.  Venous outflow: palpable thrill  6.  Radial flow: palpable radial pulse  SPECIMEN(S):  none  INDICATIONS:   Cassie Peters is a 83 y.o. female who presents with hemodialysis dependence.  The patient presents for tunneled dialysis catheter placement.  The patient is aware the risks of tunneled dialysis catheter placement include but are not limited to: bleeding, infection, central venous injury, pneumothorax, possible venous stenosis, possible malpositioning in the venous system, and possible infections related to long-term catheter presence.  The patient was aware of these risks and agreed to proceed.   DESCRIPTION: After written full informed consent was obtained from the patient, the patient was taken back to the operating room.  Prior to induction, the patient was given IV antibiotics.  After obtaining adequate sedation, the patient was prepped and draped in the standard fashion for a chest or neck tunneled dialysis catheter placement.     The cannulation site, the catheter exit site, and tract for the subcutaneous tunnel were then anesthestized with a total of 20 cc of 1% lidocaine without epinephrine.  Under ultrasound guidance, the right internal jugular vein was cannulated with the 18 gauge needle.  A J-wire was then placed down into the inferior vena cava under  fluoroscopic guidance.  The wire was then secured in place with a clamp to the drapes.  The tapered wire guide was left occluding the lumen of the needle to avoid air embolism.    I then made stab incisions at the neck and exit sites.   I dissected from the exit site to the cannulation site with a tunneler.   The wire was then unclamped and I removed the needle.  The skin tract and venotomy was dilated serially with graduated dilators, passed under fluoroscopic guidance.   Finally, the dilator-sheath was placed under fluoroscopic guidance into the superior vena cava.  The central dilator and wire were removed.  A 23 cm Palindrome catheter was placed under fluoroscopic guidance down into the right atrium.    The sheath was broken and peeled away while holding the catheter cuff at the level of the skin.  The catheter was clamped with the plastic clamp and the back end of this catheter was transected.  One lumen was docked onto the tunneler.  The catheter was delivered through the subcutaneous tunnel by pulling the tunneler out the exit site.  The catheter was reclamped and was transected a second time, revealing the two lumens of this catheter.  The catheter collar was loaded over the back end of the catheter.  The two  ports were docked onto these two lumens.  The catheter collar was then snapped into place, securing the two ports.  Each port was tested by aspirating and flushing.  No resistance was noted.  Each port was then thoroughly flushed with heparinized saline.  The catheter was secured in placed with two interrupted stitches of 3-0 Nylon tied to  the catheter.  The neck incision was closed with a U-stitch of 4-0 Monocryl.  The neck and chest incision were cleaned.  The closed neck incision was reinforced with Dermabond and sterile bandages applied to the catheter exit site.  Each port was then loaded with concentrated heparin (1000 Units/mL) at the manufacturer recommended volumes to each port.  Sterile  caps were applied to each port.    On completion fluoroscopy, the tips of the catheter were in the right atrium, and there was no evidence of pneumothorax.  At this point, the patient's drapes were removed and the patient was repositioned for a left arm access procedure.  The patient was reprepped and redraped in the standard fashion for a left arm access procedure.  I turned my attention first to identifying the patient's cephalic vein and brachial artery.  Using SonoSite guidance, the location of these vessels were marked out on the skin.     At this point, I injected local anesthetic to obtain a field block of the antecubitum.  In total, I injected about 5 mL of 1% lidocaine without epinephrine.  I made a transverse incision at the level of the antecubitum and dissected through the subcutaneous tissue and fascia to gain exposure of the brachial artery.  This was noted to be 4 mm in diameter externally.  This was dissected out proximally and distally and controlled with vessel loops .  I then dissected out the cephalic vein.  This was noted to be 3-3.5 mm in diameter externally.  The distal segment of the vein was ligated with a  2-0 silk, and the vein was transected.  The proximal segment was interrogated with serial dilators.  The vein accepted up to a 3.5 mm dilator without any difficulty.  I then instilled the heparinized saline into the vein and clamped it.  At this point, I reset my exposure of the brachial artery and placed the artery under tension proximally and distally.  I made an arteriotomy with a #11 blade, and then I extended the arteriotomy with a Potts scissor.  I injected heparinized saline proximal and distal to this arteriotomy.  The vein was then sewn to the artery in an end-to-side configuration with a running stitch of 7-0 Prolene.  Prior to completing this anastomosis, I allowed the vein and artery to backbleed.  There was no evidence of clot from any vessels.  I completed the  anastomosis in the usual fashion and then released all vessel loops and clamps.    There was a palpable thrill in the venous outflow, and there was a palpable radial pulse.  At this point, I irrigated out the surgical wound.  There was no further active bleeding.  The subcutaneous tissue was reapproximated with a running stitch of 3-0 Vicryl.  The skin was then reapproximated with a running subcuticular stitch of 4-0 Vicryl.  The skin was then cleaned, dried, and reinforced with Dermabond.  The patient tolerated this procedure well.    COMPLICATIONS: none  CONDITION: stable   Adele Barthel, MD, [facility] [place] Office: [phone] Pager: [phone]  05/14/2017, 12:25 PM

## 2017-05-14 NOTE — Progress Notes (Signed)
Pt oozing from neck insertion site right side from dialysis cath earlier today. No hematoma airway compromise or hemodynamic instability.  Vitals:   05/14/17 1600 05/14/17 1630 05/14/17 1700 05/14/17 1720  BP: (!) 176/67 (!) 184/70 (!) 172/59 (!) 177/68  Pulse: (!) 55 (!) 55 (!) 58 61  Resp: 18 18 13  (!) 26  Temp:    98.3 F (36.8 C)  TempSrc:    Oral  SpO2:   98% 98%  Weight:    114 lb 3.2 oz (51.8 kg)  Height:        Direct pressure held over needle stick site for 15 min.  Dressing applied.  Will check PT PTT.    Ruta Hinds, MD [facility] [place] Office: [phone] Pager: [phone]

## 2017-05-14 NOTE — Progress Notes (Signed)
Catheter with persistent ooze Normal coags  1% lidocaine with epi infiltrated over puncture site 1 3 0 nylon figure of eight stitch placed appears hemostatic at this point  Ruta Hinds, MD [facility] [place]: [phone] Pager: [phone]

## 2017-05-14 NOTE — Progress Notes (Signed)
Patient ID: Cassie Peters, female   DOB: 20-May-1933, 83 y.o.   MRN: [id number]  PROGRESS NOTE    Cassie Peters  WVP:[id number] DOB: 01-26-1933 DOA: 05/06/2017 PCP: Jonathon Jordan, MD   Brief Narrative:  83 year old female with history of hypertension, chronic kidney disease stage IV presented with nausea and vomiting for for 5 days. She was found to have acute kidney injury on chronic and disease. Nephrology has evaluated the patient. Vascular surgery has also been consulted for access placement.   Assessment & Plan:   Principal Problem:   Acute renal failure superimposed on stage 4 chronic kidney disease (HCC) Active Problems:   Nausea & vomiting   Hypertensive urgency  Acute kidney injury on CKD stage 4: - Nephrology following. Vascular surgery has been consulted for access placement which would probably happen today on 05/14/2017. - Repeat a.m. creatinine       -Renal US- medical renal disease -d/c nephrotoxic ACEi -Strict intake and output  HTN urgency: improved Hold nephrotoxic ACEi Continue amlodipine and labetalol. Blood pressure on the higher side; we'll monitor  N/V - - Improving -Zofran PRN  Anemia - Probably anemia of chronic disease. Hemoglobin stable. Repeat a.m. labs  Generalized deconditioning - PT/OT eval  Constipation - Continue laxatives  Hypomagnesemia - Improved  Thrombocytopenia - Questionable cause. Stable. Repeat a.m. labs  DVT prophylaxis: Heparin Code Status:  Full Family Communication: None at bedside Disposition Plan: Depends on clinical outcome  Consultants: Nephrology and vascular surgery  Procedures: None  Antimicrobials: None    Subjective: Patient seen and examined at bedside. She denies any overnight fever, nausea or vomiting. No current chest pain or shortness breath.   Objective: Vitals:   05/13/17 1813 05/13/17 2107 05/14/17 0428 05/14/17 0815  BP: (!) 153/48 (!) 173/63 (!) 160/49 (!) 182/55  Pulse: (!) 58 (!) 56  61 (!) 58  Resp: 16 16 16 18   Temp: 97.6 F (36.4 C) 98.3 F (36.8 C) 98.8 F (37.1 C) 98.2 F (36.8 C)  TempSrc: Oral Oral Oral Oral  SpO2: 100% 99% 99% 99%  Weight:  53.5 kg (118 lb)    Height:        Intake/Output Summary (Last 24 hours) at 05/14/17 1006 Last data filed at 05/14/17 0429  Gross per 24 hour  Intake               60 ml  Output                0 ml  Net               60 ml   Filed Weights   05/12/17 0030 05/12/17 2041 05/13/17 2107  Weight: 53.1 kg (117 lb) 53.5 kg (118 lb) 53.5 kg (118 lb)    Examination:  General exam: Appears calm and comfortable; Flat affect Respiratory system: Bilateral decreased breath sounds at bases with some scattered crackles  Cardiovascular system: S1 & S2 heard, intermittent bradycardia Gastrointestinal system: Abdomen is nondistended, soft and nontender. Normal bowel sounds heard. Extremities: No cyanosis, clubbing; trace edema  Data Reviewed: I have personally reviewed following labs and imaging studies  CBC:  Recent Labs Lab 05/09/17 0436 05/10/17 0310 05/10/17 1041 05/12/17 0447 05/14/17 0233  WBC 10.4 7.6 8.6 7.1 7.3  NEUTROABS  --   --   --  3.4 3.6  HGB 9.0* 7.7* 8.0* 7.2* 7.5*  HCT 28.3* 23.7* 24.8* 22.4* 23.5*  MCV 84.5 85.9 85.8 86.8 86.7  PLT 137* 112* 117*  98* 95*   Basic Metabolic Panel:  Recent Labs Lab 05/08/17 0520 05/09/17 0436 05/10/17 0310 05/10/17 1041 05/12/17 0447 05/13/17 0551 05/14/17 0233  NA 140 141 139 139 139 140 141  K 3.8 3.6 4.0 3.5 3.7 3.6 4.0  CL 108 109 105 107 107 106 105  CO2 22 23 26 23 24 25 26   GLUCOSE 79 107* 107* 148* 87 85 95  BUN 64* 58* 57* 55* 53* 52* 52*  CREATININE 6.03* 5.93* 6.25* 6.26* 6.60* 7.06* 6.97*  CALCIUM 8.2* 8.4* 8.0* 8.1* 7.6* 8.0* 8.2*  MG  --   --   --   --  1.5* 1.6* 2.3  PHOS 4.7* 4.1  --   --   --   --   --    GFR: Estimated Creatinine Clearance: 5.3 mL/min (A) (by C-G formula based on SCr of 6.97 mg/dL (H)). Liver Function  Tests:  Recent Labs Lab 05/08/17 0520 05/09/17 0436  ALBUMIN 2.8* 3.2*   No results for input(s): LIPASE, AMYLASE in the last 168 hours. No results for input(s): AMMONIA in the last 168 hours. Coagulation Profile: No results for input(s): INR, PROTIME in the last 168 hours. Cardiac Enzymes: No results for input(s): CKTOTAL, CKMB, CKMBINDEX, TROPONINI in the last 168 hours. BNP (last 3 results) No results for input(s): PROBNP in the last 8760 hours. HbA1C: No results for input(s): HGBA1C in the last 72 hours. CBG: No results for input(s): GLUCAP in the last 168 hours. Lipid Profile: No results for input(s): CHOL, HDL, LDLCALC, TRIG, CHOLHDL, LDLDIRECT in the last 72 hours. Thyroid Function Tests: No results for input(s): TSH, T4TOTAL, FREET4, T3FREE, THYROIDAB in the last 72 hours. Anemia Panel: No results for input(s): VITAMINB12, FOLATE, FERRITIN, TIBC, IRON, RETICCTPCT in the last 72 hours. Sepsis Labs: No results for input(s): PROCALCITON, LATICACIDVEN in the last 168 hours.  No results found for this or any previous visit (from the past 240 hour(s)).       Radiology Studies: No results found.      Scheduled Meds: . amLODipine  10 mg Oral Daily  . aspirin EC  81 mg Oral Daily  . atorvastatin  20 mg Oral QHS  . citalopram  10 mg Oral Daily  . docusate sodium  100 mg Oral BID  . ferrous sulfate  325 mg Oral BID WC  . heparin  5,000 Units Subcutaneous Q8H  . hydrALAZINE  100 mg Oral TID  . labetalol  200 mg Oral BID  . nortriptyline  25 mg Oral QHS  . pantoprazole  40 mg Oral Daily  . polyethylene glycol  17 g Oral Daily  . sodium bicarbonate  1,300 mg Oral BID   Continuous Infusions: . sodium chloride    . sodium chloride       LOS: 7 days        Aline August, MD [facility] Pager [phone]  If 7PM-7AM, please contact night-coverage [email or web address] Password [email or web address] 05/14/2017, 10:06 AM

## 2017-05-14 NOTE — Care Management Important Message (Signed)
Important Message  Patient Details  Name: Cassie Peters MRN: [id number] Date of Birth: October 10, 1932   Medicare Important Message Given:  Yes    Janard Culp Abena 05/14/2017, 10:30 AM

## 2017-05-14 NOTE — Progress Notes (Signed)
[facility] Progress Note    Assessment/ Plan:   1. Acute on chronic kidney disease--> ESRD:  Presumed 2/2 longstanding HTN + s/p R partial nephrectomy 2009.  Follows with Dr. Moshe Cipro.  Per last note from [facility] records, she has been relatively independent despite R sided hemiparesis.  For [facility] + AVF today followed by HD, #1.  CLIP started.  HD#2 tomorrow 4K, 2.5h, TDC, QB 250, NO heparin, 0.5-1L net UF.    2.  HTN< follow BPs with UF over next few treatments  3.  L complex renal cyst:  Follows with urology- Dr. Alinda Money.  Has been conservatively managed so far.  4  Metabolic acidosis: stop NaHCO3 with onset of HD  5.  Anemia: TSAT 23%, Ferritin 327 9/11; Stop PO Fe.  Will likely need ESA, h/o renal carcinoma in 2009 now with conservatively managed complex cysts, weigh risks/ benefits  6.  Bone/ mineral metabolism: PTH 244 9/11, Vit D 18.6.  Start low-dose calcitriol with HD. Do not prescribe upon discharge, it will be administered by outpatient HD unit.   Subjective:    No complaints this AM.     Objective:   BP (!) 182/55 (BP Location: Right Arm)   Pulse (!) 58   Temp 98.2 F (36.8 C) (Oral)   Resp 18   Ht 5\' 5"  (1.651 m)   Wt 53.5 kg (118 lb)   SpO2 99%   BMI 19.64 kg/m   Intake/Output Summary (Last 24 hours) at 05/14/17 0959 Last data filed at 05/14/17 0429  Gross per 24 hour  Intake               60 ml  Output                0 ml  Net               60 ml   Weight change: 0 kg (0 lb)  Physical Exam: GEN  Lying in bed, curled up in a ball HEENT EOMI, NCAT PULM normal WOB, clear CV RRR  ABD soft, nontender, nondistended, NABS EXT no LE edema NEURO R sided weakness, R facial droop SKIN no rashes or lesions  Imaging: No results found.  Labs: BMET  Recent Labs Lab 05/08/17 0520 05/09/17 0436 05/10/17 0310 05/10/17 1041 05/12/17 0447 05/13/17 0551 05/14/17 0233  NA 140 141 139 139 139 140 141  K 3.8 3.6 4.0 3.5 3.7 3.6 4.0  CL 108  109 105 107 107 106 105  CO2 22 23 26 23 24 25 26   GLUCOSE 79 107* 107* 148* 87 85 95  BUN 64* 58* 57* 55* 53* 52* 52*  CREATININE 6.03* 5.93* 6.25* 6.26* 6.60* 7.06* 6.97*  CALCIUM 8.2* 8.4* 8.0* 8.1* 7.6* 8.0* 8.2*  PHOS 4.7* 4.1  --   --   --   --   --    CBC  Recent Labs Lab 05/10/17 0310 05/10/17 1041 05/12/17 0447 05/14/17 0233  WBC 7.6 8.6 7.1 7.3  NEUTROABS  --   --  3.4 3.6  HGB 7.7* 8.0* 7.2* 7.5*  HCT 23.7* 24.8* 22.4* 23.5*  MCV 85.9 85.8 86.8 86.7  PLT 112* 117* 98* 95*    Medications:    . amLODipine  10 mg Oral Daily  . aspirin EC  81 mg Oral Daily  . atorvastatin  20 mg Oral QHS  . citalopram  10 mg Oral Daily  . docusate sodium  100 mg Oral BID  . ferrous  sulfate  325 mg Oral BID WC  . heparin  5,000 Units Subcutaneous Q8H  . hydrALAZINE  100 mg Oral TID  . labetalol  200 mg Oral BID  . nortriptyline  25 mg Oral QHS  . pantoprazole  40 mg Oral Daily  . polyethylene glycol  17 g Oral Daily  . sodium bicarbonate  1,300 mg Oral BID      Pearson Grippe, MD [facility] 05/14/2017, 9:59 AM

## 2017-05-14 NOTE — H&P (View-Only) (Signed)
Requested by:  Dr. Hollie Salk (Nephrology)  Reason for consultation: New access   History of Present Illness   Cassie Peters is a 83 y.o. ([birth year]) female who presents for evaluation for permanent access.  The patient is left hand dominant but had a stroke that has resulted in residual right arm > leg hemiparesis.  The patient has not had previous access procedures.  Previous central venous cannulation procedures include: none.  The patient has never had a PPM placed.   Past Medical History:  Diagnosis Date  . Angio-edema    one episode in 1st week of March 2018 - did not see dr.- son and pt state mouth and tongue swelled so bad she could not talk  . Hypertension   . Stroke [facility]) 2011   right arm and right leg effected  . Thyroid disorder     Past Surgical History:  Procedure Laterality Date  . ABDOMINAL HYSTERECTOMY     "partial hysterectomy"  . INTRAMEDULLARY (IM) NAIL INTERTROCHANTERIC Right 11/03/2016   Procedure: INTRAMEDULLARY (IM) NAIL INTERTROCHANTRIC;  Surgeon: Paralee Cancel, MD;  Location: [facility];  Service: Orthopedics;  Laterality: Right;  . right kidney surgery  approx 2010   "cyst removed from kidney"    Social History   Social History  . Marital status: Widowed    Spouse name: N/A  . Number of children: N/A  . Years of education: N/A   Occupational History  . Not on file.   Social History Main Topics  . Smoking status: Former Smoker    Years: 20.00    Types: Cigarettes    Quit date: 11/03/1991  . Smokeless tobacco: Never Used     Comment: smoked heavily in the past  . Alcohol use No  . Drug use: No  . Sexual activity: Not on file   Other Topics Concern  . Not on file   Social History Narrative  . No narrative on file   Family History: patient is unable to detail the medical history of his parents   Current Facility-Administered Medications  Medication Dose Route Frequency Provider Last Rate Last Dose  . acetaminophen (TYLENOL) tablet 650 mg  650  mg Oral Q6H PRN Etta Quill, DO   650 mg at 05/09/17 2115   Or  . acetaminophen (TYLENOL) suppository 650 mg  650 mg Rectal Q6H PRN Etta Quill, DO      . amLODipine (NORVASC) tablet 10 mg  10 mg Oral Daily Jennette Kettle M, DO   10 mg at 05/10/17 9678  . aspirin EC tablet 81 mg  81 mg Oral Daily Etta Quill, DO   81 mg at 05/10/17 9381  . atorvastatin (LIPITOR) tablet 20 mg  20 mg Oral QHS Jennette Kettle M, DO   20 mg at 05/09/17 2114  . citalopram (CELEXA) tablet 10 mg  10 mg Oral Daily Jennette Kettle M, DO   10 mg at 05/10/17 0175  . docusate sodium (COLACE) capsule 100 mg  100 mg Oral Daily Jennette Kettle M, DO   100 mg at 05/10/17 1025  . ferrous sulfate tablet 325 mg  325 mg Oral BID WC Etta Quill, DO   325 mg at 05/10/17 8527  . heparin injection 5,000 Units  5,000 Units Subcutaneous Q8H Etta Quill, DO   5,000 Units at 05/09/17 2115  . hydrALAZINE (APRESOLINE) tablet 100 mg  100 mg Oral TID Etta Quill, DO   100 mg at 05/10/17 7824  .  labetalol (NORMODYNE) tablet 200 mg  200 mg Oral BID Madelon Lips, MD   200 mg at 05/10/17 3734  . labetalol (NORMODYNE,TRANDATE) injection 10 mg  10 mg Intravenous Q2H PRN Etta Quill, DO   10 mg at 05/08/17 0043  . nortriptyline (PAMELOR) capsule 25 mg  25 mg Oral QHS Jennette Kettle M, DO   25 mg at 05/09/17 2115  . ondansetron (ZOFRAN) injection 4 mg  4 mg Intravenous Q6H PRN Etta Quill, DO   4 mg at 05/09/17 0935  . pantoprazole (PROTONIX) EC tablet 40 mg  40 mg Oral Daily Jennette Kettle M, DO   40 mg at 05/10/17 2876  . polyethylene glycol (MIRALAX / GLYCOLAX) packet 17 g  17 g Oral Daily PRN Etta Quill, DO      . senna (SENOKOT) tablet 8.6 mg  1 tablet Oral Daily PRN Etta Quill, DO      . sodium bicarbonate tablet 1,300 mg  1,300 mg Oral BID Madelon Lips, MD   1,300 mg at 05/10/17 8115  . sorbitol 70 % solution 30 mL  30 mL Oral Daily PRN Eulogio Bear U, DO        Allergies  Allergen  Reactions  . Cardura [Doxazosin Mesylate]     Allergic to pill form  . Clonidine Derivatives     Allergic to pill form  . Prinzide [Lisinopril-Hydrochlorothiazide]     Allergic to pill form    REVIEW OF SYSTEMS (negative unless checked):   Cardiac:  []  Chest pain or chest pressure? []  Shortness of breath upon activity? []  Shortness of breath when lying flat? []  Irregular heart rhythm?  Vascular:  []  Pain in calf, thigh, or hip brought on by walking? []  Pain in feet at night that wakes you up from your sleep? []  Blood clot in your veins? []  Leg swelling?  Pulmonary:  []  Oxygen at home? []  Productive cough? []  Wheezing?  Neurologic:  [x]  Sudden weakness in arms or legs? []  Sudden numbness in arms or legs? []  Sudden onset of difficult speaking or slurred speech? []  Temporary loss of vision in one eye? []  Problems with dizziness?  Gastrointestinal:  []  Blood in stool? []  Vomited blood? []  Nausea/Vomiting  Genitourinary:  []  Burning when urinating? []  Blood in urine?  Psychiatric:  []  Major depression  Hematologic:  []  Bleeding problems? []  Problems with blood clotting?  Dermatologic:  []  Rashes or ulcers?  Constitutional:  []  Fever or chills?  Ear/Nose/Throat:  []  Change in hearing? []  Nose bleeds? []  Sore throat?  Musculoskeletal:  []  Back pain? []  Joint pain? []  Muscle pain?   Physical Examination     Vitals:   05/09/17 2046 05/10/17 0419 05/10/17 0937 05/10/17 1000  BP: (!) 181/67 (!) 160/55  (!) 152/58  Pulse: 64 (!) 57 66 68  Resp: 16 16  18   Temp: 98.2 F (36.8 C) 98.2 F (36.8 C)  98.8 F (37.1 C)  TempSrc: Oral Oral  Oral  SpO2: 100% 100%  100%  Weight: 114 lb 1.6 oz (51.8 kg)     Height:       Body mass index is 18.99 kg/m.  General Alert, O x 3, Cachectic, Elderly  Head [place]/AT,    Ear/Nose/ Throat Hearing grossly intact, nares without erythema or drainage, oropharynx without Erythema or Exudate, Mallampati score: 3,     Eyes PERRLA, EOMI,    Neck Supple, mid-line trachea,    Pulmonary Sym exp, good B air movt, intermittent  rales in B lungs  Cardiac RRR, Nl S1, S2, no Murmurs, No rubs, No S3,S4  Vascular Vessel Right Left  Radial Palpable Palpable  Brachial Palpable Palpable  Carotid Palpable, No Bruit Palpable, No Bruit  Aorta Not palpable N/A  Femoral Not palpable due to positioning Not palpable due to positioning  Popliteal Not palpable Not palpable  PT Faintly palpable Faintly palpable  DP Faintly palpable Faintly palpable    Gastro- intestinal soft, non-distended, non-tender to palpation, No guarding or rebound, no HSM, no masses, no CVAT B, No palpable prominent aortic pulse,    Musculo- skeletal RUE 0/5, R arm is contracted, LUE 5/5, RLE 3-4/5, LLE 5/5  , Extremities without ischemic changes  , No edema present, No visible varicosities , No Lipodermatosclerosis present  Neurologic Cranial nerves 2-12 intact except R shoulder shrug, Pain and light touch intact in extremities , Motor exam as listed above  Psychiatric Judgement intact, Mood & affect appropriate for pt's clinical situation  Dermatologic See M/S exam for extremity exam, No rashes otherwise noted  Lymphatic  Palpable lymph nodes: None    Laboratory   CBC CBC Latest Ref Rng & Units 05/10/2017 05/10/2017 05/09/2017  WBC 4.0 - 10.5 K/uL 8.6 7.6 10.4  Hemoglobin 12.0 - 15.0 g/dL 8.0(L) 7.7(L) 9.0(L)  Hematocrit 36.0 - 46.0 % 24.8(L) 23.7(L) 28.3(L)  Platelets 150 - 400 K/uL 117(L) 112(L) 137(L)    BMP BMP Latest Ref Rng & Units 05/10/2017 05/10/2017 05/09/2017  Glucose 65 - 99 mg/dL 148(H) 107(H) 107(H)  BUN 6 - 20 mg/dL 55(H) 57(H) 58(H)  Creatinine 0.44 - 1.00 mg/dL 6.26(H) 6.25(H) 5.93(H)  Sodium 135 - 145 mmol/L 139 139 141  Potassium 3.5 - 5.1 mmol/L 3.5 4.0 3.6  Chloride 101 - 111 mmol/L 107 105 109  CO2 22 - 32 mmol/L 23 26 23   Calcium 8.9 - 10.3 mg/dL 8.1(L) 8.0(L) 8.4(L)    Coagulation Lab Results  Component Value  Date   INR 1.07 11/02/2016   INR 1.0 02/24/2009   INR 1.0 02/24/2009   No results found for: PTT  Lipids    Component Value Date/Time   CHOL (H) 02/25/2009 0615    245        ATP III CLASSIFICATION:  <200     mg/dL   Desirable  200-239  mg/dL   Borderline High  >=240    mg/dL   High          TRIG 94 02/25/2009 0615   HDL 48 02/25/2009 0615   CHOLHDL 5.1 02/25/2009 0615   VLDL 19 02/25/2009 0615   LDLCALC (H) 02/25/2009 0615    178        Total Cholesterol/HDL:CHD Risk Coronary Heart Disease Risk Table                     Men   Women  1/2 Average Risk   3.4   3.3  Average Risk       5.0   4.4  2 X Average Risk   9.6   7.1  3 X Average Risk  23.4   11.0        Use the calculated Patient Ratio above and the CHD Risk Table to determine the patient's CHD Risk.        ATP III CLASSIFICATION (LDL):  <100     mg/dL   Optimal  100-129  mg/dL   Near or Above  Optimal  130-159  mg/dL   Borderline  160-189  mg/dL   High  >190     mg/dL   Very High    Radiology     Dg Abd Portable 1v  Result Date: 05/10/2017 CLINICAL DATA:  Nausea. EXAM: PORTABLE ABDOMEN - 1 VIEW COMPARISON:  None. FINDINGS: The bowel gas pattern is normal. Residual contrast is seen in the colon. Phleboliths are noted in the pelvis. IMPRESSION: No evidence of bowel obstruction or ileus. Electronically Signed   By: Marijo Conception, M.D.   On: 05/10/2017 11:31    Non-invasive Vascular Imaging   BUE Vein Mapping  ordered   Medical Decision Making   LAVELL RIDINGS is a 83 y.o. female who presents with acute on chronic kidney disease stage IV-V   I don't think the R arm is a candidate for permanent access placement given this patient's contracture related to CVA.  Vein mapping is pending for determination of L arm access option.  I had an extensive discussion with this patient in regards to the nature of access surgery, including risk, benefits, and alternatives.    The patient is  aware that the risks of access surgery include but are not limited to: bleeding, infection, steal syndrome, nerve damage, ischemic monomelic neuropathy, failure of access to mature, complications related to venous hypertension, and possible need for additional access procedures in the future.  The patient has agreed to proceed with permanent access once options are known.   Adele Barthel, MD, FACS [facility] Office: [phone] Pager: [phone]  05/10/2017, 1:15 PM

## 2017-05-14 NOTE — Telephone Encounter (Signed)
-----   Message from Mena Goes, RN sent at 05/14/2017  1:47 PM EDT ----- Regarding: 6-8 weeks    ----- Message ----- From: Conrad , MD Sent: 05/14/2017   1:22 PM To: [address]  TALA EBER [id number] [birth year]   PROCEDURE: 1.  Right internal jugular vein tunneled dialysis catheter placement 2.  Right internal jugular vein cannulation under ultrasound guidance 3.  Left brachiocephalic arteriovenous fistula   Follow-up: 6-8 weeks

## 2017-05-14 NOTE — Anesthesia Postprocedure Evaluation (Signed)
Anesthesia Post Note  Patient: Cassie Peters  Procedure(s) Performed: Procedure(s) (LRB): LEFT ARM ARTERIOVENOUS (AV) FISTULA CREATION (Left) INSERTION OF DIALYSIS CATHETER RIGHT INTERNAL JUGULAR (Right)     Patient location during evaluation: PACU Anesthesia Type: General Level of consciousness: awake and alert Pain management: pain level controlled Vital Signs Assessment: post-procedure vital signs reviewed and stable Respiratory status: spontaneous breathing, nonlabored ventilation, respiratory function stable and patient connected to nasal cannula oxygen Cardiovascular status: blood pressure returned to baseline and stable Postop Assessment: no apparent nausea or vomiting Anesthetic complications: no    Last Vitals:  Vitals:   05/14/17 1344 05/14/17 1345  BP: (!) 145/55   Pulse: (!) 52 (!) 52  Resp: 15   Temp: (!) 36.1 C   SpO2: 100% 100%    Last Pain:  Vitals:   05/14/17 0815  TempSrc: Oral  PainSc:                  Onelia Cadmus

## 2017-05-14 NOTE — Anesthesia Preprocedure Evaluation (Signed)
Anesthesia Evaluation  Patient identified by MRN, date of birth, ID band Patient awake    Reviewed: Allergy & Precautions, H&P , NPO status , Patient's Chart, lab work & pertinent test results  Airway Mallampati: II  TM Distance: >3 FB Neck ROM: Full    Dental no notable dental hx.    Pulmonary neg pulmonary ROS, former smoker,    Pulmonary exam normal breath sounds clear to auscultation       Cardiovascular hypertension, Pt. on medications Normal cardiovascular exam Rhythm:Regular Rate:Normal     Neuro/Psych CVA, Residual Symptoms negative psych ROS   GI/Hepatic negative GI ROS, Neg liver ROS,   Endo/Other  negative endocrine ROS  Renal/GU Renal InsufficiencyRenal disease  negative genitourinary   Musculoskeletal negative musculoskeletal ROS (+)   Abdominal   Peds negative pediatric ROS (+)  Hematology negative hematology ROS (+) anemia ,   Anesthesia Other Findings   Reproductive/Obstetrics negative OB ROS                             Anesthesia Physical  Anesthesia Plan  ASA: III  Anesthesia Plan: MAC   Post-op Pain Management:    Induction: Intravenous  PONV Risk Score and Plan: 2 and 3 and Ondansetron, Dexamethasone, Treatment may vary due to age or medical condition and Midazolam  Airway Management Planned: Mask, Natural Airway, Nasal Cannula and Simple Face Mask  Additional Equipment:   Intra-op Plan:   Post-operative Plan: Extubation in OR  Informed Consent: I have reviewed the patients History and Physical, chart, labs and discussed the procedure including the risks, benefits and alternatives for the proposed anesthesia with the patient or authorized representative who has indicated his/her understanding and acceptance.   Dental advisory given  Plan Discussed with: CRNA and Surgeon  Anesthesia Plan Comments:         Anesthesia Quick Evaluation

## 2017-05-15 ENCOUNTER — Encounter (HOSPITAL_COMMUNITY): Payer: Self-pay | Admitting: Vascular Surgery

## 2017-05-15 LAB — CBC WITH DIFFERENTIAL/PLATELET
BASOS ABS: 0 10*3/uL (ref 0.0–0.1)
Basophils Relative: 0 %
EOS ABS: 0.5 10*3/uL (ref 0.0–0.7)
EOS PCT: 5 %
HCT: 23.6 % — ABNORMAL LOW (ref 36.0–46.0)
Hemoglobin: 7.6 g/dL — ABNORMAL LOW (ref 12.0–15.0)
LYMPHS PCT: 29 %
Lymphs Abs: 2.7 10*3/uL (ref 0.7–4.0)
MCH: 28.4 pg (ref 26.0–34.0)
MCHC: 32.2 g/dL (ref 30.0–36.0)
MCV: 88.1 fL (ref 78.0–100.0)
MONO ABS: 0.6 10*3/uL (ref 0.1–1.0)
Monocytes Relative: 7 %
Neutro Abs: 5.6 10*3/uL (ref 1.7–7.7)
Neutrophils Relative %: 59 %
PLATELETS: 93 10*3/uL — AB (ref 150–400)
RBC: 2.68 MIL/uL — ABNORMAL LOW (ref 3.87–5.11)
RDW: 15.1 % (ref 11.5–15.5)
WBC: 9.5 10*3/uL (ref 4.0–10.5)

## 2017-05-15 LAB — BASIC METABOLIC PANEL WITH GFR
Anion gap: 7 (ref 5–15)
BUN: 33 mg/dL — ABNORMAL HIGH (ref 6–20)
CO2: 27 mmol/L (ref 22–32)
Calcium: 8.1 mg/dL — ABNORMAL LOW (ref 8.9–10.3)
Chloride: 106 mmol/L (ref 101–111)
Creatinine, Ser: 5.19 mg/dL — ABNORMAL HIGH (ref 0.44–1.00)
GFR calc Af Amer: 8 mL/min — ABNORMAL LOW
GFR calc non Af Amer: 7 mL/min — ABNORMAL LOW
Glucose, Bld: 109 mg/dL — ABNORMAL HIGH (ref 65–99)
Potassium: 3.9 mmol/L (ref 3.5–5.1)
Sodium: 140 mmol/L (ref 135–145)

## 2017-05-15 LAB — MAGNESIUM: Magnesium: 2.1 mg/dL (ref 1.7–2.4)

## 2017-05-15 LAB — HEPATITIS B SURFACE ANTIGEN: HEP B S AG: NEGATIVE

## 2017-05-15 LAB — HEPATITIS B SURFACE ANTIBODY,QUALITATIVE: Hep B S Ab: NONREACTIVE

## 2017-05-15 LAB — HEPATITIS B CORE ANTIBODY, TOTAL: Hep B Core Total Ab: NEGATIVE

## 2017-05-15 MED ORDER — DARBEPOETIN ALFA 60 MCG/0.3ML IJ SOSY
60.0000 ug | PREFILLED_SYRINGE | INTRAMUSCULAR | Status: DC
  Administered 2017-05-15: 60 ug via INTRAVENOUS

## 2017-05-15 MED ORDER — SODIUM CHLORIDE 0.9 % IV SOLN
250.0000 mg | INTRAVENOUS | Status: DC
  Administered 2017-05-15 – 2017-05-17 (×2): 250 mg via INTRAVENOUS
  Filled 2017-05-15 (×2): qty 20

## 2017-05-15 MED ORDER — DARBEPOETIN ALFA 60 MCG/0.3ML IJ SOSY
PREFILLED_SYRINGE | INTRAMUSCULAR | Status: AC
  Administered 2017-05-15: 60 ug via INTRAVENOUS
  Filled 2017-05-15: qty 0.3

## 2017-05-15 NOTE — Progress Notes (Signed)
Occupational Therapy Treatment Patient Details Name: Cassie Peters MRN: [id number] DOB: [birth year] Today's Date: 05/15/2017    History of present illness Cassie Peters a 83 y.o.femalewith medical history significant of accelerated HTN, CVA with R hemiparesis, Fall in March with fracture, what appears to be CKD stage 4 borderline 5 at baseline as of March this year. Patient presents to the ED with c/o N/V for past 4-5 days.   Acute renal failure superimposed on stage 4 chronic kidney disease ([place]), N/v, Hypertensive urgency; now s/p surgery for vascular access for HD (fistula L UE, and R IJ tunneled HD catheter)   OT comments  Pt instructed in HEP for Rt UE with focus on inhibiting flexor synergies - she was able to return demonstration with min cues.   Follow Up Recommendations  Outpatient OT;Supervision/Assistance - 24 hour    Equipment Recommendations  None recommended by OT    Recommendations for Other Services      Precautions / Restrictions Precautions Precautions: Fall       Mobility Bed Mobility                  Transfers                      Balance                                           ADL either performed or assessed with clinical judgement   ADL                                               Vision       Perception     Praxis      Cognition Arousal/Alertness: Awake/alert Behavior During Therapy: WFL for tasks assessed/performed Overall Cognitive Status: Within Functional Limits for tasks assessed                                          Exercises Exercises: Other exercises Other Exercises Other Exercises: Movement of Rt UE dominated by flexor synergies, but she is able to peform mass extension of digits.  Worked with pt on facilitation of Rt UE for functional reach with focus on moving out of flexor synergies.  Pt provided with HEP, and was able to demonstrate  understanding with occasional cues.  Pt also with c/o Lt shoulder pain due to overuse of Lt UE.  Pt instructed in scap stretches and neck stretches    Shoulder Instructions       General Comments      Pertinent Vitals/ Pain       Pain Assessment: No/denies pain  Home Living                                          Prior Functioning/Environment              Frequency  Min 2X/week        Progress Toward Goals  OT Goals(current goals can now be found in the care plan section)  Progress towards OT goals: Progressing toward goals     Plan Discharge plan needs to be updated    Co-evaluation                 AM-PAC PT "6 Clicks" Daily Activity     Outcome Measure   Help from another person eating meals?: A Little Help from another person taking care of personal grooming?: A Little Help from another person toileting, which includes using toliet, bedpan, or urinal?: A Lot Help from another person bathing (including washing, rinsing, drying)?: A Lot Help from another person to put on and taking off regular upper body clothing?: A Little Help from another person to put on and taking off regular lower body clothing?: A Lot 6 Click Score: 15    End of Session    OT Visit Diagnosis: Hemiplegia and hemiparesis Hemiplegia - Right/Left: Right Hemiplegia - dominant/non-dominant: Dominant Hemiplegia - caused by: Cerebral infarction   Activity Tolerance Patient tolerated treatment well   Patient Left in chair;with call bell/phone within reach;with chair alarm set   Nurse Communication Mobility status        Time: 1155-2080 OT Time Calculation (min): 22 min  Charges: OT General Charges $OT Visit: 1 Visit OT Treatments $Neuromuscular Re-education: 8-22 mins  [facility], OTR/L [phone]    Lucille Passy M 05/15/2017, 5:19 PM

## 2017-05-15 NOTE — Progress Notes (Signed)
[facility] Progress Note    Assessment/ Plan:   1. Acute on chronic kidney disease--> ESRD:  Presumed 2/2 longstanding HTN + s/p R partial nephrectomy 2009.  Follows with Dr. Moshe Cipro.  Per last note from [place] records, she has been relatively independent despite R sided hemiparesis.  S/p TDC and AVF 9/17.   CLIP in process.  HD#2 today 4K, 2.5h, TDC, QB 250, NO heparin, 0.5-1L net UF.    2.  HTN< follow BPs with UF over next few treatments  3.  L complex renal cyst:  Follows with urology- Dr. Alinda Money.  Has been conservatively managed so far.  4  Metabolic acidosis: stop NaHCO3 with onset of HD  5.  Anemia: TSAT 23%, Ferritin 327 9/11; Stop PO Fe.  Will likely need ESA, h/o renal carcinoma in 2009 now with conservatively managed complex cysts, without difinitive Dx of cancer would start ESA for now given Hb in 7s and some blood loss with TDC.    6.  Bone/ mineral metabolism: PTH 244 9/11, Vit D 18.6.  Start low-dose calcitriol with HD. Do not prescribe upon discharge, it will be administered by outpatient HD unit.   Subjective:    VVS performed TDC and L BC AVF yesterday; some bleeding from [facility] resolved with stich HD #1 yesterday: 2h, 0.5L UF, 4K Pt w/o c/o this AM CLIP pending   Objective:   BP (!) 145/62 (BP Location: Right Arm)   Pulse 62   Temp 98 F (36.7 C) (Oral)   Resp 20   Ht 5\' 5"  (1.651 m)   Wt 51.8 kg (114 lb 3.2 oz)   SpO2 100%   BMI 19.00 kg/m   Intake/Output Summary (Last 24 hours) at 05/15/17 1204 Last data filed at 05/15/17 1130  Gross per 24 hour  Intake             1100 ml  Output              525 ml  Net              575 ml   Weight change: -0.924 kg (-2 lb 0.6 oz)  Physical Exam: GEN  Lying in bed, curled up in a ball HEENT EOMI, NCAT PULM normal WOB, clear CV RRR  ABD soft, nontender, nondistended, NABS EXT no LE edema NEURO R sided weakness, R facial droop SKIN no rashes or lesions  Imaging: Dg Chest Port 1  View  Result Date: 05/14/2017 CLINICAL DATA:  Central catheter placement EXAM: PORTABLE CHEST 1 VIEW COMPARISON:  November 02, 2016 FINDINGS: Central catheter tip is in the superior vena cava near the cavoatrial junction. No pneumothorax. There is no edema or consolidation. Heart is upper normal in size with pulmonary vascularity within normal limits. There is aortic atherosclerosis. There is degenerative change in the thoracic spine. IMPRESSION: Central catheter tip in superior vena cava near cavoatrial junction. No pneumothorax. No edema or consolidation. There is aortic atherosclerosis. Aortic Atherosclerosis (ICD10-I70.0). Electronically Signed   By: Lowella Grip III M.D.   On: 05/14/2017 14:23   Dg Fluoro Guide Cv Line-no Report  Result Date: 05/14/2017 Fluoroscopy was utilized by the requesting physician.  No radiographic interpretation.    Labs: BMET  Recent Labs Lab 05/09/17 0436 05/10/17 0310 05/10/17 1041 05/12/17 0447 05/13/17 0551 05/14/17 0233 05/14/17 1843 05/15/17 0630  NA 141 139 139 139 140 141 138 140  K 3.6 4.0 3.5 3.7 3.6 4.0 3.4* 3.9  CL 109 105 107  107 106 105 102 106  CO2 23 26 23 24 25 26 26 27   GLUCOSE 107* 107* 148* 87 85 95 81 109*  BUN 58* 57* 55* 53* 52* 52* 24* 33*  CREATININE 5.93* 6.25* 6.26* 6.60* 7.06* 6.97* 4.15* 5.19*  CALCIUM 8.4* 8.0* 8.1* 7.6* 8.0* 8.2* 8.0* 8.1*  PHOS 4.1  --   --   --   --   --  2.9  --    CBC  Recent Labs Lab 05/12/17 0447 05/14/17 0233 05/14/17 1843 05/15/17 0630  WBC 7.1 7.3 9.3 9.5  NEUTROABS 3.4 3.6  --  5.6  HGB 7.2* 7.5* 7.6* 7.6*  HCT 22.4* 23.5* 24.2* 23.6*  MCV 86.8 86.7 87.4 88.1  PLT 98* 95* 93* 93*    Medications:    . amLODipine  10 mg Oral Daily  . aspirin EC  81 mg Oral Daily  . atorvastatin  20 mg Oral QHS  . calcitRIOL  0.25 mcg Oral Q M,W,F  . citalopram  10 mg Oral Daily  . darbepoetin (ARANESP) injection - DIALYSIS  60 mcg Intravenous Q Tue-HD  . docusate sodium  100 mg Oral BID  .  heparin  5,000 Units Subcutaneous Q8H  . hydrALAZINE  100 mg Oral TID  . labetalol  200 mg Oral BID  . lidocaine-EPINEPHrine  10 mL Intradermal Once  . nortriptyline  25 mg Oral QHS  . pantoprazole  40 mg Oral Daily  . polyethylene glycol  17 g Oral Daily      Pearson Grippe, MD [facility] 05/15/2017, 12:04 PM

## 2017-05-15 NOTE — Progress Notes (Signed)
   Daily Progress Note   Assessment/Planning:   POD #1 s/p RIJV TDC, L BC AVF   No steal sx  RIJV related bleeding resolved  F/U in office in 6-8 weeks   Subjective  - 1 Day Post-Op   No events overnight, no complaints   Objective   Vitals:   05/14/17 1630 05/14/17 1700 05/14/17 1720 05/15/17 0633  BP: (!) 184/70 (!) 172/59 (!) 177/68 (!) 135/52  Pulse: (!) 55 (!) 58 61 (!) 56  Resp: 18 13 (!) 26 20  Temp:   98.3 F (36.8 C) 98.2 F (36.8 C)  TempSrc:   Oral Oral  SpO2:  98% 98% 100%  Weight:   114 lb 3.2 oz (51.8 kg)   Height:         Intake/Output Summary (Last 24 hours) at 05/15/17 0737 Last data filed at 05/15/17 4627  Gross per 24 hour  Intake              800 ml  Output              525 ml  Net              275 ml    VASC Palpable thrill in LUA, palpable L radial pulse  NEURO Intact sensation and hand grip in L hand    Laboratory   CBC CBC Latest Ref Rng & Units 05/15/2017 05/14/2017 05/14/2017  WBC 4.0 - 10.5 K/uL 9.5 9.3 7.3  Hemoglobin 12.0 - 15.0 g/dL 7.6(L) 7.6(L) 7.5(L)  Hematocrit 36.0 - 46.0 % 23.6(L) 24.2(L) 23.5(L)  Platelets 150 - 400 K/uL 93(L) 93(L) 95(L)    BMET    Component Value Date/Time   NA 140 05/15/2017 0630   NA 142 11/13/2016   K 3.9 05/15/2017 0630   CL 106 05/15/2017 0630   CO2 27 05/15/2017 0630   GLUCOSE 109 (H) 05/15/2017 0630   BUN 33 (H) 05/15/2017 0630   BUN 48 (A) 11/13/2016   CREATININE 5.19 (H) 05/15/2017 0630   CALCIUM 8.1 (L) 05/15/2017 0630   GFRNONAA 7 (L) 05/15/2017 0630   GFRAA 8 (L) 05/15/2017 0630     Adele Barthel, MD, FACS [facility] Office: [phone] Pager: [phone]  05/15/2017, 7:37 AM

## 2017-05-15 NOTE — Progress Notes (Signed)
Physical Therapy Treatment Patient Details Name: Cassie Peters MRN: [id number] DOB: [birth year] Today's Date: 05/15/2017    History of Present Illness Cassie Norton Foulksis a 83 y.o.femalewith medical history significant of accelerated HTN, CVA with R hemiparesis, Fall in March with fracture, what appears to be CKD stage 4 borderline 5 at baseline as of March this year. Patient presents to the ED with c/o N/V for past 4-5 days.   Acute renal failure superimposed on stage 4 chronic kidney disease ([place]), N/v, Hypertensive urgency; now s/p surgery for vascular access for HD (fistula L UE, and R IJ tunneled HD catheter)    PT Comments    Continuing work on functional mobility and activity tolerance;  Initiated session by getting more of a baseline check of her mobility at home; she uses a quad cane and has a hemiparetic gait pattern which leads to incr energy expenditure, and increased fall risk; Will consider bringing a R platform RW next session; Ms. Mountz again tells me her goal is to move more normally, she would especially like to work on her RUE; these goals can further be addressed at Outpatient PT/OT at [facility];   Noted plans for CLIP and long-term HD; worth considering performing HD in recliner   Follow Up Recommendations  Outpatient PT;Other (comment) (Outpt OT as well); Does she qualify for a HHAide?     Equipment Recommendations  Other (comment) (Consider R platform RW (pt may already have))    Recommendations for Other Services OT consult     Precautions / Restrictions Precautions Precautions: Fall    Mobility  Bed Mobility Overal bed mobility: Needs Assistance Bed Mobility: Supine to Sit     Supine to sit: Min assist;HOB elevated     General bed mobility comments: Inefficient movement pattern, but able to move both LEs to EOB and clear EOB without physical assist; min handheld assist to pull from semi-sidelie to sit  Transfers Overall transfer level: Needs  assistance Equipment used: Quad cane Transfers: Sit to/from Stand Sit to Stand: Min guard (without physical contact)         General transfer comment: Used quad cane and held back on giving assist to approximate home, and get a little better idea of baseline; Noted 2-3 tries before successfully standing, Heavy weight shift L and heavy dependence on quad cane and L UE to push up to stanc  Ambulation/Gait Ambulation/Gait assistance: Min guard;Mod assist Ambulation Distance (Feet): 65 Feet Assistive device: Quad cane Gait Pattern/deviations: Step-to pattern;Decreased stance time - left;Decreased step length - left;Decreased weight shift to right (emerging step-through pattern with support at R elbow) Gait velocity: slowed   General Gait Details: First half of walk with quad cane only; inefficient, hemiparetic pattern, with heavy weight shift onto quad cane to advance L foot; no overt loss of balance, but her current baseline gait pattern lends to high fall risk; second half of walk, provided support at R elbow and shoulder girdle and cues to shift more to R while advancing L foot; able to acheive step-through pattern; quite fatigued at end of walk   Stairs            Wheelchair Mobility    Modified Rankin (Stroke Patients Only)       Balance Overall balance assessment: Needs assistance;History of Falls Sitting-balance support: No upper extremity supported;Feet supported Sitting balance-Leahy Scale: Good     Standing balance support: Single extremity supported;Bilateral upper extremity supported Standing balance-Leahy Scale: Poor Standing balance comment: dependent on external  support                            Cognition Arousal/Alertness: Awake/alert Behavior During Therapy: WFL for tasks assessed/performed Overall Cognitive Status: Within Functional Limits for tasks assessed                                        Exercises      General  Comments        Pertinent Vitals/Pain Pain Assessment: No/denies pain (though does report nausea)    Home Living                      Prior Function            PT Goals (current goals can now be found in the care plan section) Acute Rehab PT Goals Patient Stated Goal: to go home and get better/be safe PT Goal Formulation: With patient Time For Goal Achievement: 05/22/17 Potential to Achieve Goals: Good Progress towards PT goals: Progressing toward goals    Frequency    Min 3X/week      PT Plan Current plan remains appropriate;Equipment recommendations need to be updated    Co-evaluation              AM-PAC PT "6 Clicks" Daily Activity  Outcome Measure  Difficulty turning over in bed (including adjusting bedclothes, sheets and blankets)?: A Little Difficulty moving from lying on back to sitting on the side of the bed? : A Little Difficulty sitting down on and standing up from a chair with arms (e.g., wheelchair, bedside commode, etc,.)?: A Lot Help needed moving to and from a bed to chair (including a wheelchair)?: A Little Help needed walking in hospital room?: A Little Help needed climbing 3-5 steps with a railing? : A Lot 6 Click Score: 16    End of Session Equipment Utilized During Treatment: Gait belt Activity Tolerance: Patient tolerated treatment well Patient left: in chair;with call bell/phone within reach;with chair alarm set Nurse Communication: Mobility status PT Visit Diagnosis: Unsteadiness on feet (R26.81);Other symptoms and signs involving the nervous system (R29.898);Hemiplegia and hemiparesis Hemiplegia - Right/Left: Right Hemiplegia - dominant/non-dominant: Non-dominant Hemiplegia - caused by: Cerebral infarction     Time: 0903-0930 PT Time Calculation (min) (ACUTE ONLY): 27 min  Charges:  $Gait Training: 23-37 mins                    G Codes:       Roney Marion, PT  Acute Rehabilitation Services Pager [phone] Office  [place] 05/15/2017, 10:33 AM

## 2017-05-15 NOTE — Progress Notes (Signed)
Patient ID: Cassie Peters, female   DOB: 19-Feb-1933, 83 y.o.   MRN: [id number]  PROGRESS NOTE    REMINGTON SKALSKY  DPO:[id number] DOB: [birth year] DOA: 05/06/2017 PCP: Jonathon Jordan, MD   Brief Narrative:  83 year old female with history of hypertension, chronic kidney disease stage IV presented with nausea and vomiting for for 5 days. She was found to have acute kidney injury on chronic and disease. Nephrology has evaluated the patient. Vascular surgery was also consulted for access placement.   Assessment & Plan:   Principal Problem:   Acute renal failure superimposed on stage 4 chronic kidney disease (HCC) Active Problems:   Nausea & vomiting   Hypertensive urgency  Acute kidney injury on CKD stage 4: - Nephrology following. Patient had fistula placement and right IJ HD catheter placement on 05/14/2017 vascular surgery - Dialysis as per nephrology recommendations - Repeat a.m. creatinine       -Renal US- medical renal disease -d/c nephrotoxic ACEi -Strict intake and output  HTN urgency: improved Hold nephrotoxic ACEi Continue amlodipine and labetalol and hydralazine. Blood pressure on the higher side; we'll monitor  N/V - - Improving -Zofran PRN  Anemia - Probably anemia of chronic disease. Hemoglobin stable. Repeat a.m. labs  Generalized deconditioning - PT/OT eval  Constipation - Continue laxatives  Hypomagnesemia - Improved  Thrombocytopenia - Questionable cause. Stable. Repeat a.m. labs  DVT prophylaxis: Heparin Code Status:  Full Family Communication: None at bedside Disposition Plan: Probable discharge home once cleared by nephrology  Consultants: Nephrology and vascular surgery  Procedures: Fistula placement and right IJ HD catheter placement on 05/14/2017 by vascular surgery  Antimicrobials: None    Subjective: Patient seen and examined at bedside. She does not feel well but does not elaborate on her symptoms. No nausea or vomiting. No current  chest pain or shortness breath.   Objective: Vitals:   05/14/17 1630 05/14/17 1700 05/14/17 1720 05/15/17 0633  BP: (!) 184/70 (!) 172/59 (!) 177/68 (!) 135/52  Pulse: (!) 55 (!) 58 61 (!) 56  Resp: 18 13 (!) 26 20  Temp:   98.3 F (36.8 C) 98.2 F (36.8 C)  TempSrc:   Oral Oral  SpO2:  98% 98% 100%  Weight:   51.8 kg (114 lb 3.2 oz)   Height:        Intake/Output Summary (Last 24 hours) at 05/15/17 0930 Last data filed at 05/15/17 4315  Gross per 24 hour  Intake              800 ml  Output              525 ml  Net              275 ml   Filed Weights   05/13/17 2107 05/14/17 1508 05/14/17 1720  Weight: 53.5 kg (118 lb) 52.6 kg (115 lb 15.4 oz) 51.8 kg (114 lb 3.2 oz)    Examination:  General exam: Appears calm But in mild discomfort; Flat affect Respiratory system: Bilateral decreased breath sounds at bases with some scattered crackles  Cardiovascular system: S1 & S2 heard, intermittent bradycardia Gastrointestinal system: Abdomen is nondistended, soft and nontender. Normal bowel sounds heard. Extremities: No cyanosis, clubbing; trace edema  Data Reviewed: I have personally reviewed following labs and imaging studies  CBC:  Recent Labs Lab 05/10/17 1041 05/12/17 0447 05/14/17 0233 05/14/17 1843 05/15/17 0630  WBC 8.6 7.1 7.3 9.3 9.5  NEUTROABS  --  3.4 3.6  --  5.6  HGB 8.0* 7.2* 7.5* 7.6* 7.6*  HCT 24.8* 22.4* 23.5* 24.2* 23.6*  MCV 85.8 86.8 86.7 87.4 88.1  PLT 117* 98* 95* 93* 93*   Basic Metabolic Panel:  Recent Labs Lab 05/09/17 0436  05/12/17 0447 05/13/17 0551 05/14/17 0233 05/14/17 1843 05/15/17 0630  NA 141  < > 139 140 141 138 140  K 3.6  < > 3.7 3.6 4.0 3.4* 3.9  CL 109  < > 107 106 105 102 106  CO2 23  < > 24 25 26 26 27   GLUCOSE 107*  < > 87 85 95 81 109*  BUN 58*  < > 53* 52* 52* 24* 33*  CREATININE 5.93*  < > 6.60* 7.06* 6.97* 4.15* 5.19*  CALCIUM 8.4*  < > 7.6* 8.0* 8.2* 8.0* 8.1*  MG  --   --  1.5* 1.6* 2.3  --  2.1  PHOS 4.1   --   --   --   --  2.9  --   < > = values in this interval not displayed. GFR: Estimated Creatinine Clearance: 6.8 mL/min (A) (by C-G formula based on SCr of 5.19 mg/dL (H)). Liver Function Tests:  Recent Labs Lab 05/09/17 0436 05/14/17 1843  ALBUMIN 3.2* 2.8*   No results for input(s): LIPASE, AMYLASE in the last 168 hours. No results for input(s): AMMONIA in the last 168 hours. Coagulation Profile:  Recent Labs Lab 05/14/17 1949  INR 0.95   Cardiac Enzymes: No results for input(s): CKTOTAL, CKMB, CKMBINDEX, TROPONINI in the last 168 hours. BNP (last 3 results) No results for input(s): PROBNP in the last 8760 hours. HbA1C: No results for input(s): HGBA1C in the last 72 hours. CBG: No results for input(s): GLUCAP in the last 168 hours. Lipid Profile: No results for input(s): CHOL, HDL, LDLCALC, TRIG, CHOLHDL, LDLDIRECT in the last 72 hours. Thyroid Function Tests: No results for input(s): TSH, T4TOTAL, FREET4, T3FREE, THYROIDAB in the last 72 hours. Anemia Panel: No results for input(s): VITAMINB12, FOLATE, FERRITIN, TIBC, IRON, RETICCTPCT in the last 72 hours. Sepsis Labs: No results for input(s): PROCALCITON, LATICACIDVEN in the last 168 hours.  Recent Results (from the past 240 hour(s))  Surgical pcr screen     Status: None   Collection Time: 05/14/17  8:10 AM  Result Value Ref Range Status   MRSA, PCR NEGATIVE NEGATIVE Final   Staphylococcus aureus NEGATIVE NEGATIVE Final    Comment: (NOTE) The Xpert SA Assay (FDA approved for NASAL specimens in patients 24 years of age and older), is one component of a comprehensive surveillance program. It is not intended to diagnose infection nor to guide or monitor treatment.          Radiology Studies: Dg Chest Port 1 View  Result Date: 05/14/2017 CLINICAL DATA:  Central catheter placement EXAM: PORTABLE CHEST 1 VIEW COMPARISON:  November 02, 2016 FINDINGS: Central catheter tip is in the superior vena cava near the  cavoatrial junction. No pneumothorax. There is no edema or consolidation. Heart is upper normal in size with pulmonary vascularity within normal limits. There is aortic atherosclerosis. There is degenerative change in the thoracic spine. IMPRESSION: Central catheter tip in superior vena cava near cavoatrial junction. No pneumothorax. No edema or consolidation. There is aortic atherosclerosis. Aortic Atherosclerosis (ICD10-I70.0). Electronically Signed   By: Lowella Grip III M.D.   On: 05/14/2017 14:23   Dg Fluoro Guide Cv Line-no Report  Result Date: 05/14/2017 Fluoroscopy was utilized by the requesting physician.  No radiographic interpretation.  Scheduled Meds: . amLODipine  10 mg Oral Daily  . aspirin EC  81 mg Oral Daily  . atorvastatin  20 mg Oral QHS  . calcitRIOL  0.25 mcg Oral Q M,W,F  . citalopram  10 mg Oral Daily  . docusate sodium  100 mg Oral BID  . heparin  5,000 Units Subcutaneous Q8H  . hydrALAZINE  100 mg Oral TID  . labetalol  200 mg Oral BID  . lidocaine-EPINEPHrine  10 mL Intradermal Once  . nortriptyline  25 mg Oral QHS  . pantoprazole  40 mg Oral Daily  . polyethylene glycol  17 g Oral Daily   Continuous Infusions: . sodium chloride    . sodium chloride    . sodium chloride 10 mL/hr at 05/14/17 1133     LOS: 8 days        Aline August, MD [facility] Pager [phone]  If 7PM-7AM, please contact night-coverage [email or web address] Password [email or web address] 05/15/2017, 9:30 AM

## 2017-05-16 ENCOUNTER — Inpatient Hospital Stay (HOSPITAL_COMMUNITY): Payer: Medicare Other

## 2017-05-16 DIAGNOSIS — N186 End stage renal disease: Secondary | ICD-10-CM

## 2017-05-16 DIAGNOSIS — D72829 Elevated white blood cell count, unspecified: Secondary | ICD-10-CM

## 2017-05-16 LAB — CBC WITH DIFFERENTIAL/PLATELET
BASOS PCT: 0 %
Basophils Absolute: 0 10*3/uL (ref 0.0–0.1)
EOS PCT: 0 %
Eosinophils Absolute: 0 10*3/uL (ref 0.0–0.7)
HCT: 34.8 % — ABNORMAL LOW (ref 36.0–46.0)
Hemoglobin: 11.3 g/dL — ABNORMAL LOW (ref 12.0–15.0)
LYMPHS ABS: 1.8 10*3/uL (ref 0.7–4.0)
Lymphocytes Relative: 7 %
MCH: 28.5 pg (ref 26.0–34.0)
MCHC: 32.5 g/dL (ref 30.0–36.0)
MCV: 87.9 fL (ref 78.0–100.0)
MONO ABS: 1.3 10*3/uL — AB (ref 0.1–1.0)
Monocytes Relative: 5 %
NEUTROS ABS: 22.4 10*3/uL — AB (ref 1.7–7.7)
NEUTROS PCT: 88 %
PLATELETS: 118 10*3/uL — AB (ref 150–400)
RBC: 3.96 MIL/uL (ref 3.87–5.11)
RDW: 15 % (ref 11.5–15.5)
WBC: 25.5 10*3/uL — ABNORMAL HIGH (ref 4.0–10.5)

## 2017-05-16 LAB — BASIC METABOLIC PANEL
Anion gap: 8 (ref 5–15)
BUN: 21 mg/dL — AB (ref 6–20)
CO2: 24 mmol/L (ref 22–32)
CREATININE: 3.81 mg/dL — AB (ref 0.44–1.00)
Calcium: 8.2 mg/dL — ABNORMAL LOW (ref 8.9–10.3)
Chloride: 107 mmol/L (ref 101–111)
GFR calc Af Amer: 12 mL/min — ABNORMAL LOW (ref >60)
GFR, EST NON AFRICAN AMERICAN: 10 mL/min — AB (ref >60)
GLUCOSE: 138 mg/dL — AB (ref 65–99)
POTASSIUM: 4.4 mmol/L (ref 3.5–5.1)
Sodium: 139 mmol/L (ref 135–145)

## 2017-05-16 LAB — HEMOGLOBIN AND HEMATOCRIT, BLOOD
HCT: 29 % — ABNORMAL LOW (ref 36.0–46.0)
Hemoglobin: 9.2 g/dL — ABNORMAL LOW (ref 12.0–15.0)

## 2017-05-16 LAB — MAGNESIUM: Magnesium: 2 mg/dL (ref 1.7–2.4)

## 2017-05-16 MED ORDER — LABETALOL HCL 100 MG PO TABS
100.0000 mg | ORAL_TABLET | Freq: Two times a day (BID) | ORAL | Status: DC
  Administered 2017-05-16 – 2017-05-17 (×3): 100 mg via ORAL
  Filled 2017-05-16 (×3): qty 1

## 2017-05-16 NOTE — Progress Notes (Signed)
Patient set up to have HD at [facility] on [address], [place], [place] [address]. First Treatment set up for Friday 05/18/17. Her HD days will be M/W/F. RN attempted to call patient's son Percell Miller and notify him but no answer. Patient updated with this information.   Ave Filter, RN

## 2017-05-16 NOTE — Progress Notes (Signed)
RN resume care at [place], RN

## 2017-05-16 NOTE — Progress Notes (Signed)
Pt had small amount of bright red blood in bowel movement this morning. Denies abdominal pain or lightheadedness. Dr. Grandville Silos aware.

## 2017-05-16 NOTE — Progress Notes (Signed)
PROGRESS NOTE    Cassie Peters  XNA:[id number] DOB: 27-Oct-1932 DOA: 05/06/2017 PCP: Jonathon Jordan, MD    Brief Narrative:  83 year old female with history of hypertension, chronic kidney disease stage IV presented with nausea and vomiting for for 5 days. She was found to have acute kidney injury on chronic and disease. Nephrology has evaluated the patient. Vascular surgery was also consulted for access placement.    Assessment & Plan:   Principal Problem:   Acute renal failure superimposed on stage 4 chronic kidney disease (HCC) Active Problems:   Nausea & vomiting   Hypertensive urgency  #1 acute on chronic kidney disease stage IV versus chronic progressive kidney disease stage V on hemodialysis. Patient had presented with acute on chronic kidney disease stage IV. Nephrology consulted and it was felt patient had chronic progressive kidney disease now requiring hemodialysis. Patient's worsening renal function secondary to hypertension and status post right partial nephrectomy 2009. Renal ultrasound consistent with medical renal disease. On nephrotoxic agents were discontinued such as Ace inhibitors. Patient was followed by nephrology and patient subsequently underwent fistula placement with right IJ HD catheter placement on 05/14/2017 per vascular surgery. Patient started hemodialysis. Patient for hemodialysis tomorrow. Nephrology.  #2 hypertensive urgency Patient's blood pressure now borderline. Hydralazine has been discontinued. Labetalol dose decreased. Continue Norvasc.  #3 left complex renal cyst Outpatient follow-up with urology, Dr. Alinda Money.  #4 nausea/vomiting Likely secondary to problem #1. Resolved.  #5 anemia of chronic disease H&H stable. Per Nephrology.  #6 debility PT/OT.  #7 constipation Continue current bowel regimen.  #8 hypomagnesemia Repleted.  #9 leukocytosis Patient with a worsening leukocytosis. Patient however does not look significantly ill.  Patient with no diarrhea. Patient with no cough. Repeat blood cultures 2. Check a UA with cultures and sensitivities. Check a chest x-ray. Monitor for now.  #10 thrombocytopenia Improved.   DVT prophylaxis: Heparin Code Status: Full Family Communication: Updated patient. No family at bedside. Disposition Plan: Likely home once outpatient dialysis has been set up and per nephrology.   Consultants:   Vascular surgery: Dr. Bridgett Larsson 05/10/2017  Nephrology Dr. Hollie Salk 05/07/2017  Procedures:   CT abdomen and pelvis 05/06/2017  Right diatek cath placement 05/14/2017--per Dr. Bridgett Larsson    fistula placement per vascular surgery 05/14/2017   chest x-ray 05/14/2017: 05/16/2017  Renal ultrasound 05/06/2017  Upper extremity vein mapping 05/12/2017  Antimicrobials:   None   Subjective: Patient sitting in chair. No chest pain. No shortness of breath. No complaints.  Objective: Vitals:   05/15/17 2033 05/15/17 2254 05/16/17 0547 05/16/17 0836  BP: (!) 116/40 (!) 128/54 (!) 91/37 (!) 101/50  Pulse: (!) 55 (!) 58 (!) 56 64  Resp: 18 18 18 18   Temp: 98.4 F (36.9 C) 98.5 F (36.9 C) 97.6 F (36.4 C) 98 F (36.7 C)  TempSrc: Oral Oral Oral Oral  SpO2: 98% 99% 95% 96%  Weight: 51.8 kg (114 lb 3.2 oz) 51.7 kg (114 lb)    Height:        Intake/Output Summary (Last 24 hours) at 05/16/17 1122 Last data filed at 05/16/17 0836  Gross per 24 hour  Intake          1118.83 ml  Output              725 ml  Net           393.83 ml   Filed Weights   05/15/17 1805 05/15/17 2033 05/15/17 2254  Weight: 52.6 kg (115 lb 15.4 oz) 51.8  kg (114 lb 3.2 oz) 51.7 kg (114 lb)    Examination:  General exam: Appears calm and comfortable  Respiratory system: Clear to auscultation anterior lung fields. Respiratory effort normal. Cardiovascular system: S1 & S2 heard, RRR. No JVD, murmurs, rubs, gallops or clicks. No pedal edema. Gastrointestinal system: Abdomen is nondistended, soft and nontender. No  organomegaly or masses felt. Normal bowel sounds heard. Central nervous system: Alert and oriented. No focal neurological deficits. Extremities: Symmetric 5 x 5 power. Skin: No rashes, lesions or ulcers Psychiatry: Judgement and insight appear normal. Mood & affect appropriate.     Data Reviewed: I have personally reviewed following labs and imaging studies  CBC:  Recent Labs Lab 05/12/17 0447 05/14/17 0233 05/14/17 1843 05/15/17 0630 05/16/17 0401  WBC 7.1 7.3 9.3 9.5 25.5*  NEUTROABS 3.4 3.6  --  5.6 22.4*  HGB 7.2* 7.5* 7.6* 7.6* 11.3*  HCT 22.4* 23.5* 24.2* 23.6* 34.8*  MCV 86.8 86.7 87.4 88.1 87.9  PLT 98* 95* 93* 93* 132*   Basic Metabolic Panel:  Recent Labs Lab 05/12/17 0447 05/13/17 0551 05/14/17 0233 05/14/17 1843 05/15/17 0630 05/16/17 0401  NA 139 140 141 138 140 139  K 3.7 3.6 4.0 3.4* 3.9 4.4  CL 107 106 105 102 106 107  CO2 24 25 26 26 27 24   GLUCOSE 87 85 95 81 109* 138*  BUN 53* 52* 52* 24* 33* 21*  CREATININE 6.60* 7.06* 6.97* 4.15* 5.19* 3.81*  CALCIUM 7.6* 8.0* 8.2* 8.0* 8.1* 8.2*  MG 1.5* 1.6* 2.3  --  2.1 2.0  PHOS  --   --   --  2.9  --   --    GFR: Estimated Creatinine Clearance: 9.3 mL/min (A) (by C-G formula based on SCr of 3.81 mg/dL (H)). Liver Function Tests:  Recent Labs Lab 05/14/17 1843  ALBUMIN 2.8*   No results for input(s): LIPASE, AMYLASE in the last 168 hours. No results for input(s): AMMONIA in the last 168 hours. Coagulation Profile:  Recent Labs Lab 05/14/17 1949  INR 0.95   Cardiac Enzymes: No results for input(s): CKTOTAL, CKMB, CKMBINDEX, TROPONINI in the last 168 hours. BNP (last 3 results) No results for input(s): PROBNP in the last 8760 hours. HbA1C: No results for input(s): HGBA1C in the last 72 hours. CBG: No results for input(s): GLUCAP in the last 168 hours. Lipid Profile: No results for input(s): CHOL, HDL, LDLCALC, TRIG, CHOLHDL, LDLDIRECT in the last 72 hours. Thyroid Function Tests: No  results for input(s): TSH, T4TOTAL, FREET4, T3FREE, THYROIDAB in the last 72 hours. Anemia Panel: No results for input(s): VITAMINB12, FOLATE, FERRITIN, TIBC, IRON, RETICCTPCT in the last 72 hours. Sepsis Labs: No results for input(s): PROCALCITON, LATICACIDVEN in the last 168 hours.  Recent Results (from the past 240 hour(s))  Surgical pcr screen     Status: None   Collection Time: 05/14/17  8:10 AM  Result Value Ref Range Status   MRSA, PCR NEGATIVE NEGATIVE Final   Staphylococcus aureus NEGATIVE NEGATIVE Final    Comment: (NOTE) The Xpert SA Assay (FDA approved for NASAL specimens in patients 101 years of age and older), is one component of a comprehensive surveillance program. It is not intended to diagnose infection nor to guide or monitor treatment.          Radiology Studies: Dg Chest Port 1 View  Result Date: 05/16/2017 CLINICAL DATA:  End-stage renal disease on hemodialysis. Postop day 2 left upper extremity AV fistula placement. Acute leukocytosis. EXAM: PORTABLE  CHEST 1 VIEW COMPARISON:  05/14/2017, 11/02/2016 and earlier. FINDINGS: Right jugular dialysis catheter tip projects at or near the cavoatrial junction, unchanged. Cardiac silhouette normal in size, unchanged. Thoracic aorta atherosclerotic, unchanged. Hilar and mediastinal contours otherwise unremarkable. Prominent bronchovascular markings diffusely and central peribronchial thickening, unchanged. Lungs otherwise clear. No localized airspace consolidation. No pleural effusions. No pneumothorax. Normal pulmonary vascularity. IMPRESSION: 1. No acute cardiopulmonary disease. Stable chronic bronchitis and/or asthma. 2. Right jugular dialysis catheter tip projects at or near the cavoatrial junction. 3.  Aortic Atherosclerosis (ICD10-170.0) Electronically Signed   By: Evangeline Dakin M.D.   On: 05/16/2017 10:01   Dg Chest Port 1 View  Result Date: 05/14/2017 CLINICAL DATA:  Central catheter placement EXAM: PORTABLE CHEST  1 VIEW COMPARISON:  November 02, 2016 FINDINGS: Central catheter tip is in the superior vena cava near the cavoatrial junction. No pneumothorax. There is no edema or consolidation. Heart is upper normal in size with pulmonary vascularity within normal limits. There is aortic atherosclerosis. There is degenerative change in the thoracic spine. IMPRESSION: Central catheter tip in superior vena cava near cavoatrial junction. No pneumothorax. No edema or consolidation. There is aortic atherosclerosis. Aortic Atherosclerosis (ICD10-I70.0). Electronically Signed   By: Lowella Grip III M.D.   On: 05/14/2017 14:23   Dg Fluoro Guide Cv Line-no Report  Result Date: 05/14/2017 Fluoroscopy was utilized by the requesting physician.  No radiographic interpretation.        Scheduled Meds: . amLODipine  10 mg Oral Daily  . aspirin EC  81 mg Oral Daily  . atorvastatin  20 mg Oral QHS  . calcitRIOL  0.25 mcg Oral Q M,W,F  . citalopram  10 mg Oral Daily  . darbepoetin (ARANESP) injection - DIALYSIS  60 mcg Intravenous Q Tue-HD  . docusate sodium  100 mg Oral BID  . heparin  5,000 Units Subcutaneous Q8H  . labetalol  100 mg Oral BID  . lidocaine-EPINEPHrine  10 mL Intradermal Once  . nortriptyline  25 mg Oral QHS  . pantoprazole  40 mg Oral Daily  . polyethylene glycol  17 g Oral Daily   Continuous Infusions: . sodium chloride    . sodium chloride    . sodium chloride 10 mL/hr at 05/14/17 1133  . ferric gluconate (FERRLECIT/NULECIT) IV 250 mg (05/15/17 2000)     LOS: 9 days    Time spent: 59 minutes    THOMPSON,DANIEL, MD [facility] Pager [phone]  If 7PM-7AM, please contact night-coverage [email or web address] Password [email or web address] 05/16/2017, 11:22 AM

## 2017-05-16 NOTE — Progress Notes (Signed)
[facility] Progress Note    Assessment/ Plan:   1. Acute on chronic kidney disease--> ESRD:  Presumed 2/2 longstanding HTN + s/p R partial nephrectomy 2009.  Follows with Dr. Moshe Cipro.  Per last note from [place] records, she has been relatively independent despite R sided hemiparesis.  S/p TDC and AVF 9/17.   CLIP in process.  HD#3 tomorrow, 2K, no heparin, TDC, Qb 300, 3h, 1L UF.  OK For DC once outpt HD issues arranged.   2.  HTN: BP nl/Low; stop hydralazine, reduce labetalol, cont CCB  3.  L complex renal cyst:  Follows with urology- Dr. Alinda Money.  Has been conservatively managed so far.  4  Metabolic acidosis: stop NaHCO3 with onset of HD  5.  Anemia: TSAT 23%, Ferritin 327 9/11; Stop PO Fe.  Will likely need ESA, h/o renal carcinoma in 2009 now with conservatively managed complex cysts, without difinitive Dx of cancer would start ESA for now given Hb in 7s and some blood loss with TDC.    6.  Bone/ mineral metabolism: PTH 244 9/11, Vit D 18.6.  Start low-dose calcitriol with HD. Do not prescribe upon discharge, it will be administered by outpatient HD unit.   Subjective:    HD#2 yesterday; no c/o from pt; 2.5h, 0.7L UF, TDC, 2K CLIP still in process CBC seems artifactual BP running on low side, on 3 HTN agents   Objective:   BP (!) 101/50 (BP Location: Right Arm)   Pulse 64   Temp 98 F (36.7 C) (Oral)   Resp 18   Ht 5\' 5"  (1.651 m)   Wt 51.7 kg (114 lb)   SpO2 96%   BMI 18.97 kg/m   Intake/Output Summary (Last 24 hours) at 05/16/17 1048 Last data filed at 05/16/17 0836  Gross per 24 hour  Intake          1118.83 ml  Output              725 ml  Net           393.83 ml   Weight change: 0 kg (0 lb)  Physical Exam: GEN  Lying in bed, curled up in a ball HEENT EOMI, NCAT PULM normal WOB, clear CV RRR  ABD soft, nontender, nondistended, NABS EXT no LE edema NEURO R sided weakness, R facial droop SKIN no rashes or lesions  Imaging: Dg Chest  Port 1 View  Result Date: 05/16/2017 CLINICAL DATA:  End-stage renal disease on hemodialysis. Postop day 2 left upper extremity AV fistula placement. Acute leukocytosis. EXAM: PORTABLE CHEST 1 VIEW COMPARISON:  05/14/2017, 11/02/2016 and earlier. FINDINGS: Right jugular dialysis catheter tip projects at or near the cavoatrial junction, unchanged. Cardiac silhouette normal in size, unchanged. Thoracic aorta atherosclerotic, unchanged. Hilar and mediastinal contours otherwise unremarkable. Prominent bronchovascular markings diffusely and central peribronchial thickening, unchanged. Lungs otherwise clear. No localized airspace consolidation. No pleural effusions. No pneumothorax. Normal pulmonary vascularity. IMPRESSION: 1. No acute cardiopulmonary disease. Stable chronic bronchitis and/or asthma. 2. Right jugular dialysis catheter tip projects at or near the cavoatrial junction. 3.  Aortic Atherosclerosis (ICD10-170.0) Electronically Signed   By: Evangeline Dakin M.D.   On: 05/16/2017 10:01   Dg Chest Port 1 View  Result Date: 05/14/2017 CLINICAL DATA:  Central catheter placement EXAM: PORTABLE CHEST 1 VIEW COMPARISON:  November 02, 2016 FINDINGS: Central catheter tip is in the superior vena cava near the cavoatrial junction. No pneumothorax. There is no edema or consolidation. Heart is upper  normal in size with pulmonary vascularity within normal limits. There is aortic atherosclerosis. There is degenerative change in the thoracic spine. IMPRESSION: Central catheter tip in superior vena cava near cavoatrial junction. No pneumothorax. No edema or consolidation. There is aortic atherosclerosis. Aortic Atherosclerosis (ICD10-I70.0). Electronically Signed   By: Lowella Grip III M.D.   On: 05/14/2017 14:23   Dg Fluoro Guide Cv Line-no Report  Result Date: 05/14/2017 Fluoroscopy was utilized by the requesting physician.  No radiographic interpretation.    Labs: BMET  Recent Labs Lab 05/10/17 1041  05/12/17 0447 05/13/17 0551 05/14/17 0233 05/14/17 1843 05/15/17 0630 05/16/17 0401  NA 139 139 140 141 138 140 139  K 3.5 3.7 3.6 4.0 3.4* 3.9 4.4  CL 107 107 106 105 102 106 107  CO2 23 24 25 26 26 27 24   GLUCOSE 148* 87 85 95 81 109* 138*  BUN 55* 53* 52* 52* 24* 33* 21*  CREATININE 6.26* 6.60* 7.06* 6.97* 4.15* 5.19* 3.81*  CALCIUM 8.1* 7.6* 8.0* 8.2* 8.0* 8.1* 8.2*  PHOS  --   --   --   --  2.9  --   --    CBC  Recent Labs Lab 05/12/17 0447 05/14/17 0233 05/14/17 1843 05/15/17 0630 05/16/17 0401  WBC 7.1 7.3 9.3 9.5 25.5*  NEUTROABS 3.4 3.6  --  5.6 22.4*  HGB 7.2* 7.5* 7.6* 7.6* 11.3*  HCT 22.4* 23.5* 24.2* 23.6* 34.8*  MCV 86.8 86.7 87.4 88.1 87.9  PLT 98* 95* 93* 93* 118*    Medications:    . amLODipine  10 mg Oral Daily  . aspirin EC  81 mg Oral Daily  . atorvastatin  20 mg Oral QHS  . calcitRIOL  0.25 mcg Oral Q M,W,F  . citalopram  10 mg Oral Daily  . darbepoetin (ARANESP) injection - DIALYSIS  60 mcg Intravenous Q Tue-HD  . docusate sodium  100 mg Oral BID  . heparin  5,000 Units Subcutaneous Q8H  . labetalol  100 mg Oral BID  . lidocaine-EPINEPHrine  10 mL Intradermal Once  . nortriptyline  25 mg Oral QHS  . pantoprazole  40 mg Oral Daily  . polyethylene glycol  17 g Oral Daily      Pearson Grippe, MD [facility] 05/16/2017, 10:48 AM

## 2017-05-17 LAB — RENAL FUNCTION PANEL
ANION GAP: 13 (ref 5–15)
Albumin: 2.6 g/dL — ABNORMAL LOW (ref 3.5–5.0)
BUN: 35 mg/dL — ABNORMAL HIGH (ref 6–20)
CO2: 24 mmol/L (ref 22–32)
Calcium: 7.8 mg/dL — ABNORMAL LOW (ref 8.9–10.3)
Chloride: 105 mmol/L (ref 101–111)
Creatinine, Ser: 5.11 mg/dL — ABNORMAL HIGH (ref 0.44–1.00)
GFR calc non Af Amer: 7 mL/min — ABNORMAL LOW (ref >60)
GFR, EST AFRICAN AMERICAN: 8 mL/min — AB (ref >60)
GLUCOSE: 105 mg/dL — AB (ref 65–99)
PHOSPHORUS: 4.3 mg/dL (ref 2.5–4.6)
Potassium: 4.2 mmol/L (ref 3.5–5.1)
Sodium: 142 mmol/L (ref 135–145)

## 2017-05-17 LAB — CBC WITH DIFFERENTIAL/PLATELET
BASOS ABS: 0 10*3/uL (ref 0.0–0.1)
Basophils Relative: 0 %
Eosinophils Absolute: 0.2 10*3/uL (ref 0.0–0.7)
Eosinophils Relative: 1 %
HCT: 24.6 % — ABNORMAL LOW (ref 36.0–46.0)
HEMOGLOBIN: 7.8 g/dL — AB (ref 12.0–15.0)
LYMPHS PCT: 19 %
Lymphs Abs: 3.8 10*3/uL (ref 0.7–4.0)
MCH: 27.8 pg (ref 26.0–34.0)
MCHC: 31.7 g/dL (ref 30.0–36.0)
MCV: 87.5 fL (ref 78.0–100.0)
MONO ABS: 1.4 10*3/uL — AB (ref 0.1–1.0)
MONOS PCT: 7 %
NEUTROS ABS: 14.6 10*3/uL — AB (ref 1.7–7.7)
NEUTROS PCT: 73 %
Platelets: 86 10*3/uL — ABNORMAL LOW (ref 150–400)
RBC: 2.81 MIL/uL — ABNORMAL LOW (ref 3.87–5.11)
RDW: 15.1 % (ref 11.5–15.5)
WBC: 20 10*3/uL — ABNORMAL HIGH (ref 4.0–10.5)

## 2017-05-17 NOTE — Progress Notes (Signed)
Physical Therapy Treatment Patient Details Name: Cassie Peters MRN: [id number] DOB: May 09, 1933 Today's Date: 05/17/2017    History of Present Illness Cassie Arntson Foulksis a 83 y.o.femalewith medical history significant of accelerated HTN, CVA with R hemiparesis, Fall in March with fracture, what appears to be CKD stage 4 borderline 5 at baseline as of March this year. Patient presents to the ED with c/o N/V for past 4-5 days.   Acute renal failure superimposed on stage 4 chronic kidney disease ([place]), N/v, Hypertensive urgency; now s/p surgery for vascular access for HD (fistula L UE, and R IJ tunneled HD catheter)    PT Comments    Pt is up to walk only a short trip today due to her weakness and per case management her nursing staff had commented on the assistance she needed to transfer to the chair.  Her plan is updated to ask for SNF and if family declines this will need 24/7 help at home with platform walker to manage safety and continue on with rehab.  Pt is fine with increased help and will await the family to speak with case management regarding update plan.   Follow Up Recommendations  SNF     Equipment Recommendations  Other (comment) (Definite need for R platform walker)    Recommendations for Other Services OT consult     Precautions / Restrictions Precautions Precautions: Fall Restrictions Weight Bearing Restrictions: No    Mobility  Bed Mobility               General bed mobility comments: up in chair when PT arrived  Transfers Overall transfer level: Needs assistance Equipment used: Quad cane Transfers: Sit to/from Stand Sit to Stand: Mod assist         General transfer comment: mod assist on quad cane for balance and reminders for sequence of hand placement  Ambulation/Gait Ambulation/Gait assistance: Mod assist Ambulation Distance (Feet): 45 Feet Assistive device: Quad cane Gait Pattern/deviations: Step-to pattern;Decreased stride length;Wide base of  support;Trunk flexed;Ataxic;Shuffle;Decreased dorsiflexion - right Gait velocity: reduced Gait velocity interpretation: Below normal speed for age/gender General Gait Details: pt walked with quad cane and mod assist the entire time, more assistance needed than last PT session   Stairs            Wheelchair Mobility    Modified Rankin (Stroke Patients Only)       Balance Overall balance assessment: Needs assistance;History of Falls Sitting-balance support: Feet supported Sitting balance-Leahy Scale: Good     Standing balance support: Bilateral upper extremity supported;During functional activity Standing balance-Leahy Scale: Poor Standing balance comment: mod assist and BUE support                            Cognition Arousal/Alertness: Awake/alert Behavior During Therapy: WFL for tasks assessed/performed Overall Cognitive Status: Within Functional Limits for tasks assessed                                        Exercises General Exercises - Lower Extremity Long Arc Quad: Strengthening;AAROM;Both;10 reps Heel Slides: Strengthening;Both;10 reps Hip Flexion/Marching: AAROM;AROM;Both;10 reps    General Comments        Pertinent Vitals/Pain Pain Assessment: No/denies pain    Home Living                      Prior  Function            PT Goals (current goals can now be found in the care plan section) Progress towards PT goals: Not progressing toward goals - comment (needs more help to walk a shorter distance)    Frequency    Min 3X/week      PT Plan Equipment recommendations need to be updated;Discharge plan needs to be updated    Co-evaluation              AM-PAC PT "6 Clicks" Daily Activity  Outcome Measure  Difficulty turning over in bed (including adjusting bedclothes, sheets and blankets)?: Unable Difficulty moving from lying on back to sitting on the side of the bed? : Unable Difficulty sitting down  on and standing up from a chair with arms (e.g., wheelchair, bedside commode, etc,.)?: Unable Help needed moving to and from a bed to chair (including a wheelchair)?: A Lot Help needed walking in hospital room?: A Lot Help needed climbing 3-5 steps with a railing? : Total 6 Click Score: 8    End of Session Equipment Utilized During Treatment: Gait belt Activity Tolerance: Patient tolerated treatment well Patient left: in chair;with call bell/phone within reach;with chair alarm set Nurse Communication: Mobility status PT Visit Diagnosis: Unsteadiness on feet (R26.81);Other symptoms and signs involving the nervous system (R29.898);Hemiplegia and hemiparesis Hemiplegia - Right/Left: Right Hemiplegia - dominant/non-dominant: Non-dominant Hemiplegia - caused by: Cerebral infarction     Time: 6629-4765 PT Time Calculation (min) (ACUTE ONLY): 24 min  Charges:  $Gait Training: 8-22 mins $Therapeutic Exercise: 8-22 mins                    G Codes:  Functional Assessment Tool Used: AM-PAC 6 Clicks Basic Mobility     Ramond Dial 05/17/2017, 1:09 PM   Mee Hives, PT MS Acute Rehab Dept. Number: [place] and [place]

## 2017-05-17 NOTE — Progress Notes (Signed)
PROGRESS NOTE    Cassie Peters  YJE:[id number] DOB: 1933/04/23 DOA: 05/06/2017 PCP: Jonathon Jordan, MD    Brief Narrative:  83 year old female with history of hypertension, chronic kidney disease stage IV presented with nausea and vomiting for for 5 days. She was found to have acute kidney injury on chronic and disease. Nephrology has evaluated the patient. Vascular surgery was also consulted for access placement.    Assessment & Plan:   Principal Problem:   Acute renal failure superimposed on stage 4 chronic kidney disease (HCC) Active Problems:   Nausea & vomiting   Hypertensive urgency   ESRD (end stage renal disease) (HCC)  #1 acute on chronic kidney disease stage IV versus chronic progressive kidney disease stage V on hemodialysis. Patient had presented with acute on chronic kidney disease stage IV. Nephrology consulted and it was felt patient had chronic progressive kidney disease now requiring hemodialysis. Patient's worsening renal function secondary to hypertension and status post right partial nephrectomy 2009. Renal ultrasound consistent with medical renal disease. On nephrotoxic agents were discontinued such as Ace inhibitors. Patient was followed by nephrology and patient subsequently underwent fistula placement with right IJ HD catheter placement on 05/14/2017 per vascular surgery. Patient started hemodialysis. Patient for hemodialysis today and cleared by nephrology for discharge after hemodialysis. Nephrology.  #2 hypertensive urgency Patient's blood pressure note a slow after discontinuation of hydralazine and decrease dose of labetalol. Continue Norvasc.  #3 left complex renal cyst Outpatient follow-up with urology, Dr. Alinda Money.  #4 nausea/vomiting Likely secondary to problem #1. Resolved.  #5 anemia of chronic disease H&H stable. Per Nephrology.  #6 debility PT/OT. Patient has been reassessed by physical therapy who are now recommending skilled nursing  facility.  #7 constipation Continue current bowel regimen.  #8 hypomagnesemia Repleted.  #9 leukocytosis Patient with a worsening leukocytosis which is trending back down. Patient however does not look significantly ill. Patient with no diarrhea. Patient with no cough. Repeat blood cultures 2 with no growth to date. Chest x-ray negative for any acute infiltrate. No need for any antibiotics at this time. Monitor.   #10 thrombocytopenia Fluctuating.   DVT prophylaxis: Heparin Code Status: Full Family Communication: Updated patient. No family at bedside. Disposition Plan: now skilled nursing facility per PT evaluation.   Consultants:   Vascular surgery: Dr. Bridgett Larsson 05/10/2017  Nephrology Dr. Hollie Salk 05/07/2017  Procedures:   CT abdomen and pelvis 05/06/2017  Right diatek cath placement 05/14/2017--per Dr. Bridgett Larsson    fistula placement per vascular surgery 05/14/2017   chest x-ray 05/14/2017: 05/16/2017  Renal ultrasound 05/06/2017  Upper extremity vein mapping 05/12/2017  Antimicrobials:   None   Subjective: Patient sitting in chair. Patient sided to be able to go home after hemodialysis. Patient denies any chest pain or shortness of breath.   Objective: Vitals:   05/17/17 1600 05/17/17 1630 05/17/17 1650 05/17/17 1743  BP: (!) 153/52 (!) 148/54 (!) 138/53 (!) 96/41  Pulse: 64 68 68 79  Resp:   16 16  Temp:   98.4 F (36.9 C) 98.2 F (36.8 C)  TempSrc:   Oral Oral  SpO2:   96% 100%  Weight:   51.5 kg (113 lb 8.6 oz)   Height:        Intake/Output Summary (Last 24 hours) at 05/17/17 1747 Last data filed at 05/17/17 1650  Gross per 24 hour  Intake                0 ml  Output  1000 ml  Net            -1000 ml   Filed Weights   05/16/17 2123 05/17/17 1342 05/17/17 1650  Weight: 51.7 kg (114 lb) 52.6 kg (115 lb 15.4 oz) 51.5 kg (113 lb 8.6 oz)    Examination:  General exam: Appears calm and comfortable  Respiratory system: Clear to  auscultation anterior lung fields. No crackles, no rhonchi. Respiratory effort normal. Cardiovascular system: S1 & S2 heard, RRR. No JVD, murmurs, rubs, gallops or clicks. No pedal edema. Gastrointestinal system: Abdomen is nondistended, soft and nontender. No organomegaly or masses felt. Normal bowel sounds heard. Central nervous system: Alert and oriented. No focal neurological deficits. Extremities: Symmetric 5 x 5 power. Skin: No rashes, lesions or ulcers Psychiatry: Judgement and insight appear normal. Mood & affect appropriate.     Data Reviewed: I have personally reviewed following labs and imaging studies  CBC:  Recent Labs Lab 05/12/17 0447 05/14/17 0233 05/14/17 1843 05/15/17 0630 05/16/17 0401 05/16/17 1609 05/17/17 0214  WBC 7.1 7.3 9.3 9.5 25.5*  --  20.0*  NEUTROABS 3.4 3.6  --  5.6 22.4*  --  14.6*  HGB 7.2* 7.5* 7.6* 7.6* 11.3* 9.2* 7.8*  HCT 22.4* 23.5* 24.2* 23.6* 34.8* 29.0* 24.6*  MCV 86.8 86.7 87.4 88.1 87.9  --  87.5  PLT 98* 95* 93* 93* 118*  --  86*   Basic Metabolic Panel:  Recent Labs Lab 05/12/17 0447 05/13/17 0551 05/14/17 0233 05/14/17 1843 05/15/17 0630 05/16/17 0401 05/17/17 0214  NA 139 140 141 138 140 139 142  K 3.7 3.6 4.0 3.4* 3.9 4.4 4.2  CL 107 106 105 102 106 107 105  CO2 24 25 26 26 27 24 24   GLUCOSE 87 85 95 81 109* 138* 105*  BUN 53* 52* 52* 24* 33* 21* 35*  CREATININE 6.60* 7.06* 6.97* 4.15* 5.19* 3.81* 5.11*  CALCIUM 7.6* 8.0* 8.2* 8.0* 8.1* 8.2* 7.8*  MG 1.5* 1.6* 2.3  --  2.1 2.0  --   PHOS  --   --   --  2.9  --   --  4.3   GFR: Estimated Creatinine Clearance: 6.9 mL/min (A) (by C-G formula based on SCr of 5.11 mg/dL (H)). Liver Function Tests:  Recent Labs Lab 05/14/17 1843 05/17/17 0214  ALBUMIN 2.8* 2.6*   No results for input(s): LIPASE, AMYLASE in the last 168 hours. No results for input(s): AMMONIA in the last 168 hours. Coagulation Profile:  Recent Labs Lab 05/14/17 1949  INR 0.95   Cardiac  Enzymes: No results for input(s): CKTOTAL, CKMB, CKMBINDEX, TROPONINI in the last 168 hours. BNP (last 3 results) No results for input(s): PROBNP in the last 8760 hours. HbA1C: No results for input(s): HGBA1C in the last 72 hours. CBG: No results for input(s): GLUCAP in the last 168 hours. Lipid Profile: No results for input(s): CHOL, HDL, LDLCALC, TRIG, CHOLHDL, LDLDIRECT in the last 72 hours. Thyroid Function Tests: No results for input(s): TSH, T4TOTAL, FREET4, T3FREE, THYROIDAB in the last 72 hours. Anemia Panel: No results for input(s): VITAMINB12, FOLATE, FERRITIN, TIBC, IRON, RETICCTPCT in the last 72 hours. Sepsis Labs: No results for input(s): PROCALCITON, LATICACIDVEN in the last 168 hours.  Recent Results (from the past 240 hour(s))  Surgical pcr screen     Status: None   Collection Time: 05/14/17  8:10 AM  Result Value Ref Range Status   MRSA, PCR NEGATIVE NEGATIVE Final   Staphylococcus aureus NEGATIVE NEGATIVE Final  Comment: (NOTE) The Xpert SA Assay (FDA approved for NASAL specimens in patients 13 years of age and older), is one component of a comprehensive surveillance program. It is not intended to diagnose infection nor to guide or monitor treatment.   Culture, blood (Routine X 2) w Reflex to ID Panel     Status: None (Preliminary result)   Collection Time: 05/16/17  8:47 AM  Result Value Ref Range Status   Specimen Description BLOOD RIGHT ARM  Final   Special Requests IN PEDIATRIC BOTTLE Blood Culture adequate volume  Final   Culture NO GROWTH 1 DAY  Final   Report Status PENDING  Incomplete  Culture, blood (Routine X 2) w Reflex to ID Panel     Status: None (Preliminary result)   Collection Time: 05/16/17  8:47 AM  Result Value Ref Range Status   Specimen Description BLOOD RIGHT HAND  Final   Special Requests IN PEDIATRIC BOTTLE Blood Culture adequate volume  Final   Culture NO GROWTH 1 DAY  Final   Report Status PENDING  Incomplete          Radiology Studies: Dg Chest Port 1 View  Result Date: 05/16/2017 CLINICAL DATA:  End-stage renal disease on hemodialysis. Postop day 2 left upper extremity AV fistula placement. Acute leukocytosis. EXAM: PORTABLE CHEST 1 VIEW COMPARISON:  05/14/2017, 11/02/2016 and earlier. FINDINGS: Right jugular dialysis catheter tip projects at or near the cavoatrial junction, unchanged. Cardiac silhouette normal in size, unchanged. Thoracic aorta atherosclerotic, unchanged. Hilar and mediastinal contours otherwise unremarkable. Prominent bronchovascular markings diffusely and central peribronchial thickening, unchanged. Lungs otherwise clear. No localized airspace consolidation. No pleural effusions. No pneumothorax. Normal pulmonary vascularity. IMPRESSION: 1. No acute cardiopulmonary disease. Stable chronic bronchitis and/or asthma. 2. Right jugular dialysis catheter tip projects at or near the cavoatrial junction. 3.  Aortic Atherosclerosis (ICD10-170.0) Electronically Signed   By: Evangeline Dakin M.D.   On: 05/16/2017 10:01        Scheduled Meds: . amLODipine  10 mg Oral Daily  . aspirin EC  81 mg Oral Daily  . atorvastatin  20 mg Oral QHS  . calcitRIOL  0.25 mcg Oral Q M,W,F  . citalopram  10 mg Oral Daily  . darbepoetin (ARANESP) injection - DIALYSIS  60 mcg Intravenous Q Tue-HD  . docusate sodium  100 mg Oral BID  . heparin  5,000 Units Subcutaneous Q8H  . labetalol  100 mg Oral BID  . lidocaine-EPINEPHrine  10 mL Intradermal Once  . nortriptyline  25 mg Oral QHS  . pantoprazole  40 mg Oral Daily  . polyethylene glycol  17 g Oral Daily   Continuous Infusions: . sodium chloride    . sodium chloride    . sodium chloride 10 mL/hr at 05/14/17 1133  . ferric gluconate (FERRLECIT/NULECIT) IV 250 mg (05/17/17 1640)     LOS: 10 days    Time spent: 35 minutes    THOMPSON,DANIEL, MD [facility] Pager [phone]  If 7PM-7AM, please contact  night-coverage [email or web address] Password [facility] 05/17/2017, 5:47 PM

## 2017-05-17 NOTE — Progress Notes (Addendum)
Occupational Therapy Progress Note (late entry for 05/16/17)  Pt very fatigued this date and only able to tolerate minimal activity.  DOE 3/4 with simple transfer.  BP 125/65; 02 sats 100% on RA, HR 69.   She requires min - max A for ADLs and min A for functional transfers.  She reports son will assist her at discharge.  Recommend OPOT.    05/16/17 1315  OT Visit Information  Last OT Received On 05/17/17  Assistance Needed +1  History of Present Illness Cassie Lafoy Foulksis a 83 y.o.femalewith medical history significant of accelerated HTN, CVA with R hemiparesis, Fall in March with fracture, what appears to be CKD stage 4 borderline 5 at baseline as of March this year. Patient presents to the ED with c/o N/V for past 4-5 days.   Acute renal failure superimposed on stage 4 chronic kidney disease ([place]), N/v, Hypertensive urgency; now s/p surgery for vascular access for HD (fistula L UE, and R IJ tunneled HD catheter)  Precautions  Precautions Fall  Pain Assessment  Pain Assessment No/denies pain  Cognition  Arousal/Alertness Awake/alert  Behavior During Therapy WFL for tasks assessed/performed  Overall Cognitive Status Within Functional Limits for tasks assessed  ADL  Overall ADL's  Needs assistance/impaired  Toilet Transfer Minimal assistance;Stand-pivot;BSC (quad cane )  Toilet Transfer Details (indicate cue type and reason) Pt requires min A for balance upon standing.  Pt very fatigued with DOE 3/5   Toileting- Clothing Manipulation and Hygiene Maximal assistance;Sit to/from stand  Toileting - Clothing Manipulation Details (indicate cue type and reason) due to fatigue   Functional mobility during ADLs Minimal assistance (quad cane )  Bed Mobility  General bed mobility comments up in chair   Balance  Overall balance assessment Needs assistance;History of Falls  Sitting-balance support No upper extremity supported;Feet supported  Sitting balance-Leahy Scale Good  Standing balance  support Single extremity supported  Standing balance-Leahy Scale Poor  Standing balance comment requires min A and UE support   Transfers  Overall transfer level Needs assistance  Equipment used Quad cane  Transfers Sit to/from [facility]  Sit to Stand Min assist  Stand pivot transfers Min assist  General transfer comment Pt required min A for balance.  She is very fatigued this date with limited activity tolerance.  DOE 3/4   General Comments  General comments (skin integrity, edema, etc.) BP 125/65; 02 sats 100% RA, HR 69  Other Exercises  Other Exercises Pt able to demonstrate understanding of UE exercises, but fatigued and unable to complete with OT this date   OT - End of Session  Equipment Utilized During Treatment Other (comment) (quad cane )  Activity Tolerance Patient limited by fatigue  Patient left in chair;with call bell/phone within reach  Nurse Communication Mobility status  OT Assessment/Plan  OT Plan Discharge plan remains appropriate  OT Visit Diagnosis Hemiplegia and hemiparesis  Hemiplegia - Right/Left Right  Hemiplegia - dominant/non-dominant Dominant  Hemiplegia - caused by Cerebral infarction  OT Frequency (ACUTE ONLY) Min 2X/week  Follow Up Recommendations Outpatient OT;Supervision/Assistance - 24 hour  OT Equipment None recommended by OT  AM-PAC OT "6 Clicks" Daily Activity Outcome Measure  Help from another person eating meals? 3  Help from another person taking care of personal grooming? 3  Help from another person toileting, which includes using toliet, bedpan, or urinal? 2  Help from another person bathing (including washing, rinsing, drying)? 2  Help from another person to put on and taking off  regular upper body clothing? 3  Help from another person to put on and taking off regular lower body clothing? 2  6 Click Score 15  ADL G Code Conversion CK  OT Goal Progression  Progress towards OT goals Not progressing toward goals -  comment (fatigued )  OT Time Calculation  OT Start Time (ACUTE ONLY) 1250  OT Stop Time (ACUTE ONLY) 1302  OT Time Calculation (min) 12 min  OT General Charges  $OT Visit 1 Visit  OT Treatments  $Neuromuscular Re-education 8-22 mins  [facility], OTR/L [phone]

## 2017-05-17 NOTE — Progress Notes (Signed)
[facility] Progress Note    Assessment/ Plan:   1. Acute on chronic kidney disease--> ESRD:  Presumed 2/2 longstanding HTN + s/p R partial nephrectomy 2009.  Followed with Dr. Moshe Cipro.  S/p TDC and AVF 9/17 and great B/T.   CLIP to [facility] MWF 2nd shift.  HD#3 today: 2K, no heparin, TDC, Qb 300, 3h, 1L UF.  Can DC today after HD.    2.  HTN: BP nl/Low; stopped hydralazine, will stop labetalol as well; cont CCB qhs  3.  L complex renal cyst:  Follows with urology- Dr. Alinda Money.  Has been conservatively managed so far.  4  Metabolic acidosis: stopped NaHCO3 with onset of HD  5.  Anemia: TSAT 23%, Ferritin 327 9/11; Stop PO Fe.  Will likely need ESA, h/o renal carcinoma in 2009 now with conservatively managed complex cysts, without difinitive Dx of cancer would start ESA for now given Hb in 7s and some blood loss with TDC.    6.  Bone/ mineral metabolism: PTH 244 9/11, Vit D 18.6.  Start low-dose calcitriol with HD. Do not prescribe upon discharge, it will be administered by outpatient HD unit.   Subjective:    Doing well CLIP to [facility] MWF 2nd shift, can start tomorrow, she has paperwork For HD today prior to DC   Objective:   BP (!) 135/43 (BP Location: Left Arm)   Pulse 65   Temp 98.2 F (36.8 C) (Oral)   Resp 18   Ht 5\' 5"  (1.651 m)   Wt 51.7 kg (114 lb)   SpO2 100%   BMI 18.97 kg/m   Intake/Output Summary (Last 24 hours) at 05/17/17 1134 Last data filed at 05/17/17 0600  Gross per 24 hour  Intake              120 ml  Output                0 ml  Net              120 ml   Weight change: -0.89 kg (-1 lb 15.4 oz)  Physical Exam: GEN  Lying in bed, curled up in a ball HEENT EOMI, NCAT PULM normal WOB, clear CV RRR  ABD soft, nontender, nondistended, NABS EXT no LE edema NEURO R sided weakness, R facial droop SKIN no rashes or lesions +B/T of AVF.   Imaging: Dg Chest Port 1 View  Result Date: 05/16/2017 CLINICAL DATA:  End-stage renal disease  on hemodialysis. Postop day 2 left upper extremity AV fistula placement. Acute leukocytosis. EXAM: PORTABLE CHEST 1 VIEW COMPARISON:  05/14/2017, 11/02/2016 and earlier. FINDINGS: Right jugular dialysis catheter tip projects at or near the cavoatrial junction, unchanged. Cardiac silhouette normal in size, unchanged. Thoracic aorta atherosclerotic, unchanged. Hilar and mediastinal contours otherwise unremarkable. Prominent bronchovascular markings diffusely and central peribronchial thickening, unchanged. Lungs otherwise clear. No localized airspace consolidation. No pleural effusions. No pneumothorax. Normal pulmonary vascularity. IMPRESSION: 1. No acute cardiopulmonary disease. Stable chronic bronchitis and/or asthma. 2. Right jugular dialysis catheter tip projects at or near the cavoatrial junction. 3.  Aortic Atherosclerosis (ICD10-170.0) Electronically Signed   By: Evangeline Dakin M.D.   On: 05/16/2017 10:01    Labs: BMET  Recent Labs Lab 05/12/17 0447 05/13/17 0551 05/14/17 0233 05/14/17 1843 05/15/17 0630 05/16/17 0401 05/17/17 0214  NA 139 140 141 138 140 139 142  K 3.7 3.6 4.0 3.4* 3.9 4.4 4.2  CL 107 106 105 102 106 107 105  CO2  24 25 26 26 27 24 24   GLUCOSE 87 85 95 81 109* 138* 105*  BUN 53* 52* 52* 24* 33* 21* 35*  CREATININE 6.60* 7.06* 6.97* 4.15* 5.19* 3.81* 5.11*  CALCIUM 7.6* 8.0* 8.2* 8.0* 8.1* 8.2* 7.8*  PHOS  --   --   --  2.9  --   --  4.3   CBC  Recent Labs Lab 05/14/17 0233 05/14/17 1843 05/15/17 0630 05/16/17 0401 05/16/17 1609 05/17/17 0214  WBC 7.3 9.3 9.5 25.5*  --  20.0*  NEUTROABS 3.6  --  5.6 22.4*  --  14.6*  HGB 7.5* 7.6* 7.6* 11.3* 9.2* 7.8*  HCT 23.5* 24.2* 23.6* 34.8* 29.0* 24.6*  MCV 86.7 87.4 88.1 87.9  --  87.5  PLT 95* 93* 93* 118*  --  86*    Medications:    . amLODipine  10 mg Oral Daily  . aspirin EC  81 mg Oral Daily  . atorvastatin  20 mg Oral QHS  . calcitRIOL  0.25 mcg Oral Q M,W,F  . citalopram  10 mg Oral Daily  .  darbepoetin (ARANESP) injection - DIALYSIS  60 mcg Intravenous Q Tue-HD  . docusate sodium  100 mg Oral BID  . heparin  5,000 Units Subcutaneous Q8H  . labetalol  100 mg Oral BID  . lidocaine-EPINEPHrine  10 mL Intradermal Once  . nortriptyline  25 mg Oral QHS  . pantoprazole  40 mg Oral Daily  . polyethylene glycol  17 g Oral Daily      Pearson Grippe, MD [facility] 05/17/2017, 11:34 AM

## 2017-05-17 NOTE — Care Management Important Message (Signed)
Important Message  Patient Details  Name: Cassie Peters MRN: [id number] Date of Birth: [birth year]   Medicare Important Message Given:  Yes    Nathen May 05/17/2017, 9:38 AM

## 2017-05-17 NOTE — Progress Notes (Signed)
Case manager, please call son Percell Miller to discuss discharge arrangements and needs.  Pt states her niece Lawerance Bach is going to stay with her in her home.

## 2017-05-18 LAB — RENAL FUNCTION PANEL
ALBUMIN: 2.5 g/dL — AB (ref 3.5–5.0)
Anion gap: 9 (ref 5–15)
BUN: 14 mg/dL (ref 6–20)
CO2: 29 mmol/L (ref 22–32)
Calcium: 7.9 mg/dL — ABNORMAL LOW (ref 8.9–10.3)
Chloride: 97 mmol/L — ABNORMAL LOW (ref 101–111)
Creatinine, Ser: 3.45 mg/dL — ABNORMAL HIGH (ref 0.44–1.00)
GFR calc Af Amer: 13 mL/min — ABNORMAL LOW (ref >60)
GFR calc non Af Amer: 11 mL/min — ABNORMAL LOW (ref >60)
GLUCOSE: 102 mg/dL — AB (ref 65–99)
POTASSIUM: 3.5 mmol/L (ref 3.5–5.1)
Phosphorus: 3.8 mg/dL (ref 2.5–4.6)
SODIUM: 135 mmol/L (ref 135–145)

## 2017-05-18 MED ORDER — LABETALOL HCL 100 MG PO TABS: 100.0000 mg | ORAL_TABLET | Freq: Two times a day (BID) | ORAL | 1 refills | Status: AC

## 2017-05-18 MED ORDER — CALCITRIOL 0.25 MCG PO CAPS
ORAL_CAPSULE | ORAL | Status: AC
  Filled 2017-05-18: qty 1

## 2017-05-18 NOTE — Progress Notes (Signed)
[facility] Progress Note    Assessment/ Plan:   1. Acute on chronic kidney disease--> ESRD:  Presumed 2/2 longstanding HTN + s/p R partial nephrectomy 2009.  Followed with Dr. Moshe Cipro.  S/p TDC and AVF 9/17 and great B/T.   CLIP'd to [facility] MWF 2nd shift.  HD today to keep on schedule: 2K, no heparin, TDC, Qb 300, 3h, 1L UF.  Can DC today after HD.    2.  HTN: BP nl/Low; impropves after stopped BB and hydra, cont ccb for now qhs.   3.  L complex renal cyst:  Follows with urology- Dr. Alinda Money.  Has been conservatively managed so far.  4  Metabolic acidosis: stopped NaHCO3 with onset of HD  5.  Anemia: TSAT 23%, Ferritin 327 9/11; Stop PO Fe.  Will likely need ESA, h/o renal carcinoma in 2009 now with conservatively managed complex cysts, without difinitive Dx of cancer would start ESA for now given Hb in 7s and some blood loss with TDC.    6.  Bone/ mineral metabolism: PTH 244 9/11, Vit D 18.6.  Start low-dose calcitriol with HD. Do not prescribe upon discharge, it will be administered by outpatient HD unit.   Subjective:    Doing well DC held up for placement issues, home vs snf, appears now to be home CLIP to [facility] MWF 2nd shift, can start Monday, she has paperwork    Objective:   BP (!) 136/42 (BP Location: Left Arm)   Pulse 65   Temp 98.8 F (37.1 C) (Oral)   Resp 18   Ht 5\' 5"  (1.651 m)   Wt 51.5 kg (113 lb 8.6 oz)   SpO2 100%   BMI 18.89 kg/m   Intake/Output Summary (Last 24 hours) at 05/18/17 1134 Last data filed at 05/18/17 5361  Gross per 24 hour  Intake                0 ml  Output             1000 ml  Net            -1000 ml   Weight change: 0.89 kg (1 lb 15.4 oz)  Physical Exam: GEN  In chair, pleasant, nad HEENT EOMI, NCAT PULM normal WOB, clear CV RRR  ABD soft, nontender, nondistended, NABS EXT no LE edema NEURO R sided weakness, R facial droop SKIN no rashes or lesions +B/T of AVF.   Imaging: No results  found.  Labs: BMET  Recent Labs Lab 05/13/17 0551 05/14/17 0233 05/14/17 1843 05/15/17 0630 05/16/17 0401 05/17/17 0214 05/18/17 0250  NA 140 141 138 140 139 142 135  K 3.6 4.0 3.4* 3.9 4.4 4.2 3.5  CL 106 105 102 106 107 105 97*  CO2 25 26 26 27 24 24 29   GLUCOSE 85 95 81 109* 138* 105* 102*  BUN 52* 52* 24* 33* 21* 35* 14  CREATININE 7.06* 6.97* 4.15* 5.19* 3.81* 5.11* 3.45*  CALCIUM 8.0* 8.2* 8.0* 8.1* 8.2* 7.8* 7.9*  PHOS  --   --  2.9  --   --  4.3 3.8   CBC  Recent Labs Lab 05/14/17 0233 05/14/17 1843 05/15/17 0630 05/16/17 0401 05/16/17 1609 05/17/17 0214  WBC 7.3 9.3 9.5 25.5*  --  20.0*  NEUTROABS 3.6  --  5.6 22.4*  --  14.6*  HGB 7.5* 7.6* 7.6* 11.3* 9.2* 7.8*  HCT 23.5* 24.2* 23.6* 34.8* 29.0* 24.6*  MCV 86.7 87.4 88.1 87.9  --  87.5  PLT 95* 93* 93* 118*  --  86*    Medications:    . amLODipine  10 mg Oral Daily  . aspirin EC  81 mg Oral Daily  . atorvastatin  20 mg Oral QHS  . calcitRIOL  0.25 mcg Oral Q M,W,F  . citalopram  10 mg Oral Daily  . darbepoetin (ARANESP) injection - DIALYSIS  60 mcg Intravenous Q Tue-HD  . docusate sodium  100 mg Oral BID  . heparin  5,000 Units Subcutaneous Q8H  . labetalol  100 mg Oral BID  . nortriptyline  25 mg Oral QHS  . pantoprazole  40 mg Oral Daily  . polyethylene glycol  17 g Oral Daily      Pearson Grippe, MD [facility] 05/18/2017, 11:34 AM

## 2017-05-18 NOTE — Discharge Summary (Signed)
Physician Discharge Summary  Cassie Peters BJS:[id number] DOB: 1933/06/04 DOA: 05/06/2017  PCP: Jonathon Jordan, MD  Admit date: 05/06/2017 Discharge date: 05/18/2017  Time spent: 65 minutes  Recommendations for Outpatient Follow-up:  1. Follow-up with Dr. Moshe Cipro in 2 weeks. On follow-up patient in need a CBC done to follow-up on leukocytosis. 2. Follow-up on [facility] on Monday, 05/21/2017 second shift for first outpatient hemodialysis. 3. Follow-up with Dr. Alinda Money, urology for left complex renal cyst in about 2-3 weeks. 4. Follow-up with Jonathon Jordan, MD in 2-3 weeks.   Discharge Diagnoses:  Principal Problem:   Acute renal failure superimposed on stage 4 chronic kidney disease (HCC) Active Problems:   Nausea & vomiting   Hypertensive urgency   ESRD (end stage renal disease) ([place])   Discharge Condition: Stable and improved  Diet recommendation: Heart healthy  Filed Weights   05/17/17 1650 05/18/17 1247 05/18/17 1550  Weight: 51.5 kg (113 lb 8.6 oz) 49.9 kg (110 lb 0.2 oz) 48.9 kg (107 lb 12.9 oz)    History of present illness:  Per Dr Doralee Albino is a 83 y.o. female with medical history significant of accelerated HTN, what appearred to be CKD stage 4 borderline 5 at baseline as of March this year.  Patient presented to the ED with c/o N/V for past 4-5 days.  Not able to keep anything including meds down.  Seen at [facility] Friday, given zofran.  Improved slightly but got worse again today.   ED Course: Creat 6.7 up from baseline of 2.7 or so back in March this year, BUN 73, bicarb 18, no anion gap, HGB 9.0.  CT non-contrast of AP is neg.  UA shows proteinuria.  Hospital Course:  #1 acute on chronic kidney disease stage IV versus chronic progressive kidney disease stage V on hemodialysis. Patient had presented with acute on chronic kidney disease stage IV. Nephrology consulted and it was felt patient had chronic progressive kidney disease now requiring hemodialysis.  Patient's worsening renal function secondary to hypertension and status post right partial nephrectomy 2009. Renal ultrasound consistent with medical renal disease. All nephrotoxic agents were discontinued such as Ace inhibitors. Patient was followed by nephrology and patient subsequently underwent fistula placement with right IJ HD catheter placement on 05/14/2017 per vascular surgery. Patient started hemodialysis. Patient was subsequently clipped to [facility] MWF 2nd shift. Patient received hemodialysis on day of discharge. Outpatient follow-up with her nephrologist and in hemodialysis.   #2 hypertensive urgency Patient's blood pressure initially was elevated however after hemodialysis was started patient blood pressure became borderline. Hydralazine was discontinued and labetalol dose was decreased. Patient was maintained on her Norvasc. Blood pressure remained stable. Outpatient follow-up.   #3 left complex renal cyst Outpatient follow-up with urology, Dr. Alinda Money.  #4 nausea/vomiting Likely secondary to problem #1. Resolved by day of discharge. Patient was started on hemodialysis.  #5 anemia of chronic disease H&H stable. Per Nephrology.  #6 debility PT/OT. Patient has been reassessed by physical therapy who are now recommending skilled nursing facility. Family however stated were able to provide 24/ 7 supervision and would prefer to take patient home. Patient was subsequently discharged home with home health therapies.  #7 constipation Patient was placed on the bowel regimen. Constipation was resolved by day of discharge.  #8 hypomagnesemia Repleted.  #9 leukocytosis Patient with a worsening leukocytosis which is trending back down. Patient however does not look significantly ill. Patient with no diarrhea. Patient with no cough. Repeat blood cultures 2 with no growth  to date. Chest x-ray negative for any acute infiltrate. Patient remained afebrile. Outpatient follow-up.  #10  thrombocytopenia Remained stable throughout the hospitalization. Patient had no bleeding. Outpatient follow-up.    Procedures:  CT abdomen and pelvis 05/06/2017  Right diatek cath placement 05/14/2017--per Dr. Bridgett Larsson    fistula placement per vascular surgery 05/14/2017   chest x-ray 05/14/2017: 05/16/2017  Renal ultrasound 05/06/2017  Upper extremity vein mapping 05/12/2017  Consultations:  Vascular surgery: Dr. Bridgett Larsson 05/10/2017  Nephrology Dr. Hollie Salk 05/07/2017  Discharge Exam: Vitals:   05/18/17 1540 05/18/17 1550  BP:  (!) 139/53  Pulse:  70  Resp:  18  Temp: 98 F (36.7 C) 98 F (36.7 C)  SpO2:  100%    General: NAD Cardiovascular: RRR Respiratory: CTAB  Discharge Instructions   Discharge Instructions    Diet - low sodium heart healthy    Complete by:  As directed    Diet - low sodium heart healthy    Complete by:  As directed    Increase activity slowly    Complete by:  As directed    Increase activity slowly    Complete by:  As directed      Current Discharge Medication List    START taking these medications   Details  labetalol (NORMODYNE) 100 MG tablet Take 1 tablet (100 mg total) by mouth 2 (two) times daily. Qty: 60 tablet, Refills: 1      CONTINUE these medications which have NOT CHANGED   Details  amLODipine (NORVASC) 10 MG tablet Take 10 mg by mouth daily.    aspirin EC 81 MG tablet Take 81 mg by mouth daily.    atorvastatin (LIPITOR) 20 MG tablet Take 20 mg by mouth at bedtime.    cholecalciferol (VITAMIN D) 1000 units tablet Take 1,000 Units by mouth daily.    citalopram (CELEXA) 10 MG tablet Take 10 mg by mouth daily.     docusate sodium (COLACE) 100 MG capsule Take 1 capsule (100 mg total) by mouth 2 (two) times daily. Qty: 10 capsule, Refills: 0    ferrous sulfate 325 (65 FE) MG tablet Take 1 tablet (325 mg total) by mouth 2 (two) times daily with a meal.    nortriptyline (PAMELOR) 25 MG capsule Take 1 capsule (25 mg total)  by mouth at bedtime.    pantoprazole (PROTONIX) 40 MG tablet Take 40 mg by mouth daily. For indigestion.     polyethylene glycol (MIRALAX / GLYCOLAX) packet Take 17 g by mouth daily as needed for mild constipation. Qty: 14 each, Refills: 0    senna (SENOKOT) 8.6 MG TABS Take 1 tablet by mouth daily as needed. For constipation.      STOP taking these medications     cloNIDine (CATAPRES) 0.1 MG tablet      hydrALAZINE (APRESOLINE) 100 MG tablet      lisinopril (PRINIVIL,ZESTRIL) 40 MG tablet        Allergies  Allergen Reactions  . Cardura [Doxazosin Mesylate]     Allergic to pill form  . Clonidine Derivatives     Allergic to pill form  . Prinzide [Lisinopril-Hydrochlorothiazide]     Allergic to pill form   Follow-up Information    Corliss Parish, MD. Schedule an appointment as soon as possible for a visit in 2 week(s).   Specialty:  Nephrology Contact information: [place] [address] [facility] Follow up on 05/21/2017.   Why:  f/u on Monday 05/21/2017 for HD       Raynelle Bring, MD. Schedule an appointment as soon as possible for a visit in 2 week(s).   Specialty:  Urology Why:  f/u in 2-3 weeks. Contact information: [place] [address] [phone]        Jonathon Jordan, MD. Schedule an appointment as soon as possible for a visit in 2 week(s).   Specialty:  Family Medicine Why:  f/u in 2-3 weeks. Contact information: [place] [address] [phone]            The results of significant diagnostics from this hospitalization (including imaging, microbiology, ancillary and laboratory) are listed below for reference.    Significant Diagnostic Studies: Ct Abdomen Pelvis Wo Contrast  Result Date: 05/06/2017 CLINICAL DATA:  Vomiting and abdominal pain for 2 weeks which has worsened over the past 3 days. EXAM: CT ABDOMEN AND PELVIS WITHOUT CONTRAST  TECHNIQUE: Multidetector CT imaging of the abdomen and pelvis was performed following the standard protocol without IV contrast. COMPARISON:  MRI abdomen 06/14/2016. FINDINGS: Lower chest: Heart size is upper normal. No pleural or pericardial effusion. Hepatobiliary: Low attenuating lesion in the right hepatic lobe consistent with a cyst is unchanged since the prior MRI. The gallbladder and biliary tree are unremarkable. Pancreas: Unremarkable. No pancreatic ductal dilatation or surrounding inflammatory changes. Spleen: Normal in size without focal abnormality. Adrenals/Urinary Tract: The patient status post partial right nephrectomy. Lesion in the anterior aspect of the left kidney which measured 1.5 cm on prior MRI measures 2.3 cm today. Other lesions are not grossly changed. Stomach/Bowel: Small hiatal hernia is noted. A few scattered diverticula are seen in an otherwise unremarkable colon. The appendix is not visualized and may have been removed. No evidence of inflammatory process seen. Small bowel is unremarkable. Vascular/Lymphatic: Extensive aortic atherosclerosis is identified. No aneurysm. No lymphadenopathy. Reproductive: Status post hysterectomy. No adnexal masses. Other: No fluid collection. Musculoskeletal: Simple lipoma along the lower right chest is noted. Fracture fixation hardware right hip and femur is identified. Lumbar spondylosis is seen. No acute bony abnormality. IMPRESSION: No acute abnormality abdomen or pelvis. Complex cyst in the upper pole of the left kidney seen on prior MRI has increased in size. Recommend follow-up with urology. Extensive atherosclerotic vascular disease. Small hiatal hernia. Mild diverticulosis without diverticulitis. Electronically Signed   By: Inge Rise M.D.   On: 05/06/2017 21:52   US Renal  Result Date: 05/07/2017 CLINICAL DATA:  Acute renal failure. Superimposed on stage 4 chronic kidney disease. Previous partial right nephrectomy. Hypertension. EXAM:  RENAL / URINARY TRACT ULTRASOUND COMPLETE COMPARISON:  CT abdomen and pelvis 05/06/2017 FINDINGS: Right Kidney: Length: 9.1 cm. Diffusely increased parenchymal echotexture consistent with chronic medical renal disease. No hydronephrosis. Cystic structure with mild internal echotexture demonstrated in the lower pole measuring 2.3 cm maximal diameter. This corresponds to a septated cyst on prior MRI 06/14/2016. Additional smaller cysts seen in MRI are not identified. Left Kidney: Length: 11.3 cm. Diffusely increased parenchymal echotexture suggesting chronic medical renal disease. No hydronephrosis. Multiple cystic lesions are demonstrated. Along the upper pole, there is a simple appearing cyst measuring 3.3 cm max diameter. In the mid pole, there are 2 adjacent cysts. Mildly complex cyst measuring 2.5 cm diameter and a complex cyst measuring 2.1 cm diameter. These correspond to lesions seen on previous MRI and CT. See recommendations from previous studies. Bladder: No filling defect or bladder wall thickening identified. IMPRESSION: Increased  parenchymal echotexture bilaterally consistent with chronic medical renal disease. No hydronephrosis in either kidney. Bilateral renal cysts. Complex cyst demonstrated on the left kidney. See follow-up recommendations from previous CT and MRI examinations. Electronically Signed   By: Lucienne Capers M.D.   On: 05/07/2017 00:05   Dg Chest Port 1 View  Result Date: 05/16/2017 CLINICAL DATA:  End-stage renal disease on hemodialysis. Postop day 2 left upper extremity AV fistula placement. Acute leukocytosis. EXAM: PORTABLE CHEST 1 VIEW COMPARISON:  05/14/2017, 11/02/2016 and earlier. FINDINGS: Right jugular dialysis catheter tip projects at or near the cavoatrial junction, unchanged. Cardiac silhouette normal in size, unchanged. Thoracic aorta atherosclerotic, unchanged. Hilar and mediastinal contours otherwise unremarkable. Prominent bronchovascular markings diffusely and  central peribronchial thickening, unchanged. Lungs otherwise clear. No localized airspace consolidation. No pleural effusions. No pneumothorax. Normal pulmonary vascularity. IMPRESSION: 1. No acute cardiopulmonary disease. Stable chronic bronchitis and/or asthma. 2. Right jugular dialysis catheter tip projects at or near the cavoatrial junction. 3.  Aortic Atherosclerosis (ICD10-170.0) Electronically Signed   By: Evangeline Dakin M.D.   On: 05/16/2017 10:01   Dg Chest Port 1 View  Result Date: 05/14/2017 CLINICAL DATA:  Central catheter placement EXAM: PORTABLE CHEST 1 VIEW COMPARISON:  November 02, 2016 FINDINGS: Central catheter tip is in the superior vena cava near the cavoatrial junction. No pneumothorax. There is no edema or consolidation. Heart is upper normal in size with pulmonary vascularity within normal limits. There is aortic atherosclerosis. There is degenerative change in the thoracic spine. IMPRESSION: Central catheter tip in superior vena cava near cavoatrial junction. No pneumothorax. No edema or consolidation. There is aortic atherosclerosis. Aortic Atherosclerosis (ICD10-I70.0). Electronically Signed   By: Lowella Grip III M.D.   On: 05/14/2017 14:23   Dg Abd Portable 1v  Result Date: 05/10/2017 CLINICAL DATA:  Nausea. EXAM: PORTABLE ABDOMEN - 1 VIEW COMPARISON:  None. FINDINGS: The bowel gas pattern is normal. Residual contrast is seen in the colon. Phleboliths are noted in the pelvis. IMPRESSION: No evidence of bowel obstruction or ileus. Electronically Signed   By: Marijo Conception, M.D.   On: 05/10/2017 11:31   Dg Fluoro Guide Cv Line-no Report  Result Date: 05/14/2017 Fluoroscopy was utilized by the requesting physician.  No radiographic interpretation.    Microbiology: Recent Results (from the past 240 hour(s))  Surgical pcr screen     Status: None   Collection Time: 05/14/17  8:10 AM  Result Value Ref Range Status   MRSA, PCR NEGATIVE NEGATIVE Final   Staphylococcus  aureus NEGATIVE NEGATIVE Final    Comment: (NOTE) The Xpert SA Assay (FDA approved for NASAL specimens in patients 15 years of age and older), is one component of a comprehensive surveillance program. It is not intended to diagnose infection nor to guide or monitor treatment.   Culture, blood (Routine X 2) w Reflex to ID Panel     Status: None (Preliminary result)   Collection Time: 05/16/17  8:47 AM  Result Value Ref Range Status   Specimen Description BLOOD RIGHT ARM  Final   Special Requests IN PEDIATRIC BOTTLE Blood Culture adequate volume  Final   Culture NO GROWTH 2 DAYS  Final   Report Status PENDING  Incomplete  Culture, blood (Routine X 2) w Reflex to ID Panel     Status: None (Preliminary result)   Collection Time: 05/16/17  8:47 AM  Result Value Ref Range Status   Specimen Description BLOOD RIGHT HAND  Final   Special Requests IN PEDIATRIC  BOTTLE Blood Culture adequate volume  Final   Culture NO GROWTH 2 DAYS  Final   Report Status PENDING  Incomplete     Labs: Basic Metabolic Panel:  Recent Labs Lab 05/12/17 0447 05/13/17 0551 05/14/17 0233 05/14/17 1843 05/15/17 0630 05/16/17 0401 05/17/17 0214 05/18/17 0250  NA 139 140 141 138 140 139 142 135  K 3.7 3.6 4.0 3.4* 3.9 4.4 4.2 3.5  CL 107 106 105 102 106 107 105 97*  CO2 24 25 26 26 27 24 24 29   GLUCOSE 87 85 95 81 109* 138* 105* 102*  BUN 53* 52* 52* 24* 33* 21* 35* 14  CREATININE 6.60* 7.06* 6.97* 4.15* 5.19* 3.81* 5.11* 3.45*  CALCIUM 7.6* 8.0* 8.2* 8.0* 8.1* 8.2* 7.8* 7.9*  MG 1.5* 1.6* 2.3  --  2.1 2.0  --   --   PHOS  --   --   --  2.9  --   --  4.3 3.8   Liver Function Tests:  Recent Labs Lab 05/14/17 1843 05/17/17 0214 05/18/17 0250  ALBUMIN 2.8* 2.6* 2.5*   No results for input(s): LIPASE, AMYLASE in the last 168 hours. No results for input(s): AMMONIA in the last 168 hours. CBC:  Recent Labs Lab 05/12/17 0447 05/14/17 0233 05/14/17 1843 05/15/17 0630 05/16/17 0401 05/16/17 1609  05/17/17 0214  WBC 7.1 7.3 9.3 9.5 25.5*  --  20.0*  NEUTROABS 3.4 3.6  --  5.6 22.4*  --  14.6*  HGB 7.2* 7.5* 7.6* 7.6* 11.3* 9.2* 7.8*  HCT 22.4* 23.5* 24.2* 23.6* 34.8* 29.0* 24.6*  MCV 86.8 86.7 87.4 88.1 87.9  --  87.5  PLT 98* 95* 93* 93* 118*  --  86*   Cardiac Enzymes: No results for input(s): CKTOTAL, CKMB, CKMBINDEX, TROPONINI in the last 168 hours. BNP: BNP (last 3 results) No results for input(s): BNP in the last 8760 hours.  ProBNP (last 3 results) No results for input(s): PROBNP in the last 8760 hours.  CBG: No results for input(s): GLUCAP in the last 168 hours.     SignedIrine Seal MD.  [facility] 05/18/2017, 4:54 PM

## 2017-05-18 NOTE — Clinical Social Work Note (Signed)
CSW received consult for SNF placement. Nurse case manager Malachy Mood Royal communicated with family and confirmed that the discharge plan is home with [facility] services. CSW signing off as SNF placement declined.  Piercen Covino Givens, MSW, LCSW Licensed Clinical Social Worker [place] [phone]

## 2017-05-18 NOTE — Progress Notes (Signed)
At 1800 Discharge instructions reviewed with pt and her son, Percell Miller. Copy of instructions and script given to pt and son. Pt's dinner here, pt wanting to eat before leaving. Pt to call when ready for wheelchair to discharge. Son at side.    At [place] d/c'd via wheelchair with belongings, with son.    Escorted by unit NT.

## 2017-05-18 NOTE — Care Management Note (Addendum)
Case Management Note  Patient Details  Name: Cassie Peters MRN: [id number] Date of Birth: [birth year]  Subjective/Objective:   CM following for progression and d/c needs. Pt from home with son . Other family members also assist as needed.                 Action/Plan: Met with pt on 9/17 and 05/17/17 Noted per PT that pt has declined and now recommendation is for 24hr assistance. Per RN this was discussed with pt son on his visit of 9/20pm and he stated at that time that the family could provide 24hr care. This CM attempting to reach the son today to clarify pt and family needs. Pt has been CLIPPED and has a HD schedule and seat time.  Will plan to to d/c today 05/18/17. 11:30 am met with pt and son, plan is for d/c to home with [facility] service to be provided by [facility]. SCAT application given to pt son, who states that he can drive her to HD most of the time. HH arranged a pt has become weaker and will need her days between HD treatments for rest and home PT.  No DME needs per pt and son. Both states that the pt has a rolling walker with a platform.   Expected Discharge Date:  05/18/2017             Expected Discharge Plan:  [place]  In-House Referral:  NA, Clinical Social Work  Discharge planning Services  CM Consult  Post Acute Care Choice:  Home Health Choice offered to:  Adult Children  DME Arranged:   NA DME Agency:   NA  HH Arranged:   HHRN, HHPT, [place] and Education officer, museum.  [place] Agency:   [facility]  Status of Service:  Complete.  If discussed at [place] of Stay Meetings, dates discussed:    Additional Comments:  Adron Bene, RN 05/18/2017, 10:20 AM

## 2017-05-21 LAB — CULTURE, BLOOD (ROUTINE X 2)
Culture: NO GROWTH
Culture: NO GROWTH
SPECIAL REQUESTS: ADEQUATE
Special Requests: ADEQUATE

## 2017-06-28 NOTE — Progress Notes (Signed)
    Postoperative Access Visit   History of Present Illness   SHAHD OCCHIPINTI is a 83 y.o. year old female who presents for postoperative follow-up for: left brachiocephalic arteriovenous fistula (Date: 05/14/17).  The patient's wounds are healed.  The patient notes no steal symptoms.  The patient is able to complete their activities of daily living.  The patient's current symptoms are: none.   Physical Examination   Vitals:   06/29/17 1036  BP: (!) 113/56  Pulse: 61  Resp: 18  Temp: (!) 97 F (36.1 C)  TempSrc: Oral  SpO2: 98%  Weight: 105 lb (47.6 kg)  Height: 5\' 5"  (1.651 m)     left arm Incision is healed, skin feels warm, hand grip is 5/5, sensation in digits is  intact, palpable thrill, bruit can  be auscultated, fistula is visible and tortuous    Medical Decision Making   KIYLEE THORESON is a 83 y.o. year old female who presents s/p left brachiocephalic arteriovenous fistula, ESRD-HD   The patient's access is ready for use.  I would not be surprised if techs have some difficult with this patient's fistula given its very superficial position and tortuousity.  Thank you for allowing [place] to participate in this patient's care.   Adele Barthel, MD, FACS [facility] Office: [phone] Pager: [phone]

## 2017-06-29 ENCOUNTER — Encounter: Payer: Self-pay | Admitting: Vascular Surgery

## 2017-06-29 ENCOUNTER — Ambulatory Visit (INDEPENDENT_AMBULATORY_CARE_PROVIDER_SITE_OTHER): Payer: Self-pay | Admitting: Vascular Surgery

## 2017-06-29 VITALS — BP 113/56 | HR 61 | Temp 97.0°F | Resp 18 | Ht 65.0 in | Wt 105.0 lb

## 2017-06-29 DIAGNOSIS — N186 End stage renal disease: Secondary | ICD-10-CM

## 2017-07-06 ENCOUNTER — Encounter: Payer: Medicare Other | Admitting: Vascular Surgery

## 2017-09-25 ENCOUNTER — Emergency Department (HOSPITAL_COMMUNITY)
Admission: EM | Admit: 2017-09-25 | Discharge: 2017-09-28 | Disposition: E | Payer: Medicare Other | Attending: Emergency Medicine | Admitting: Emergency Medicine

## 2017-09-25 ENCOUNTER — Encounter (HOSPITAL_COMMUNITY): Payer: Self-pay | Admitting: Emergency Medicine

## 2017-09-25 DIAGNOSIS — I120 Hypertensive chronic kidney disease with stage 5 chronic kidney disease or end stage renal disease: Secondary | ICD-10-CM | POA: Insufficient documentation

## 2017-09-25 DIAGNOSIS — W010XXA Fall on same level from slipping, tripping and stumbling without subsequent striking against object, initial encounter: Secondary | ICD-10-CM | POA: Insufficient documentation

## 2017-09-25 DIAGNOSIS — Z8673 Personal history of transient ischemic attack (TIA), and cerebral infarction without residual deficits: Secondary | ICD-10-CM | POA: Diagnosis not present

## 2017-09-25 DIAGNOSIS — Z87891 Personal history of nicotine dependence: Secondary | ICD-10-CM | POA: Diagnosis not present

## 2017-09-25 DIAGNOSIS — Y998 Other external cause status: Secondary | ICD-10-CM | POA: Insufficient documentation

## 2017-09-25 DIAGNOSIS — S0993XA Unspecified injury of face, initial encounter: Secondary | ICD-10-CM | POA: Diagnosis present

## 2017-09-25 DIAGNOSIS — Z7982 Long term (current) use of aspirin: Secondary | ICD-10-CM | POA: Diagnosis not present

## 2017-09-25 DIAGNOSIS — I469 Cardiac arrest, cause unspecified: Secondary | ICD-10-CM | POA: Diagnosis not present

## 2017-09-25 DIAGNOSIS — Y9389 Activity, other specified: Secondary | ICD-10-CM | POA: Insufficient documentation

## 2017-09-25 DIAGNOSIS — Z992 Dependence on renal dialysis: Secondary | ICD-10-CM | POA: Insufficient documentation

## 2017-09-25 DIAGNOSIS — Z79899 Other long term (current) drug therapy: Secondary | ICD-10-CM | POA: Insufficient documentation

## 2017-09-25 DIAGNOSIS — S0083XA Contusion of other part of head, initial encounter: Secondary | ICD-10-CM | POA: Diagnosis not present

## 2017-09-25 DIAGNOSIS — Y92009 Unspecified place in unspecified non-institutional (private) residence as the place of occurrence of the external cause: Secondary | ICD-10-CM | POA: Diagnosis not present

## 2017-09-25 DIAGNOSIS — N186 End stage renal disease: Secondary | ICD-10-CM | POA: Diagnosis not present

## 2017-09-25 MED ORDER — SODIUM BICARBONATE 8.4 % IV SOLN
INTRAVENOUS | Status: AC | PRN
Start: 2017-09-25 — End: 2017-09-25
  Administered 2017-09-25: 50 meq via INTRAVENOUS

## 2017-09-25 MED ORDER — INSULIN ASPART 100 UNIT/ML ~~LOC~~ SOLN
SUBCUTANEOUS | Status: AC
  Filled 2017-09-25: qty 1

## 2017-09-25 MED ORDER — DEXTROSE 50 % IV SOLN
INTRAVENOUS | Status: AC | PRN
  Administered 2017-09-25: 50 mL via INTRAVENOUS

## 2017-09-25 MED ORDER — CALCIUM CHLORIDE 10 % IV SOLN
INTRAVENOUS | Status: AC | PRN
  Administered 2017-09-25: 1 g via INTRAVENOUS

## 2017-09-25 MED ORDER — EPINEPHRINE PF 1 MG/10ML IJ SOSY
PREFILLED_SYRINGE | INTRAMUSCULAR | Status: AC | PRN
Start: 2017-09-25 — End: 2017-09-25
  Administered 2017-09-25 (×5): 1 mg via INTRAVENOUS

## 2017-09-25 MED ORDER — SODIUM CHLORIDE 0.9 % IV SOLN
INTRAVENOUS | Status: AC | PRN
Start: 2017-09-25 — End: 2017-09-25
  Administered 2017-09-25: 100 mL/h via INTRAVENOUS

## 2017-09-26 MED FILL — Medication: Qty: 1 | Status: AC
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
## 2017-09-28 NOTE — Code Documentation (Signed)
1804 Compressions started 1805 20G in L Arm, by RN Chelsea F 1808 1mg  of Epi 1809 Bicarb 1810 Calcium 1813 1mg  of Epi, Pulse Check 1816 Pulse Check 1818 1mg  of Epi 1820 Pulse Check 1822 1mg  of Epi, Pulse Check 1824 Pulse Check 1826 Pulse Check 1827 1mg  of Epi 1831 10 insulin, Amp of D50 1834 Compression Stopped, Pulse Check, Time of Death call by Dr Tomi Bamberger
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
## 2017-09-28 NOTE — Code Documentation (Signed)
10 units of insulin given

## 2017-09-28 NOTE — ED Notes (Signed)
Pt has dried blood on the back of her head.

## 2017-09-28 NOTE — Progress Notes (Signed)
Pt intubated during a code. Pt ventilated using an ambu bag during the code.

## 2017-09-28 NOTE — ED Notes (Signed)
Paged ME/Rick to Dr. Tomi Bamberger

## 2017-09-28 DEATH — deceased
